# Patient Record
Sex: Female | Born: 1940 | Race: Black or African American | Hispanic: No | Marital: Married | Smoking: Former smoker
Health system: Southern US, Community
[De-identification: ages and names within clinical notes are randomized; demographics above are authoritative.]

## PROBLEM LIST (undated history)

## (undated) DIAGNOSIS — I519 Heart disease, unspecified: Secondary | ICD-10-CM

## (undated) DIAGNOSIS — I10 Essential (primary) hypertension: Secondary | ICD-10-CM

## (undated) DIAGNOSIS — K829 Disease of gallbladder, unspecified: Secondary | ICD-10-CM

## (undated) DIAGNOSIS — F0390 Unspecified dementia without behavioral disturbance: Secondary | ICD-10-CM

## (undated) DIAGNOSIS — E7800 Pure hypercholesterolemia, unspecified: Secondary | ICD-10-CM

## (undated) HISTORY — DX: Pure hypercholesterolemia, unspecified: E78.00

## (undated) HISTORY — DX: Heart disease, unspecified: I51.9

## (undated) HISTORY — DX: Essential (primary) hypertension: I10

## (undated) HISTORY — DX: Disease of gallbladder, unspecified: K82.9

## (undated) HISTORY — PX: ABDOMINAL HYSTERECTOMY: SHX81

---

## 1997-11-25 ENCOUNTER — Other Ambulatory Visit: Admission: RE | Admit: 1997-11-25 | Discharge: 1997-11-25 | Payer: Self-pay | Admitting: Family Medicine

## 1998-12-29 ENCOUNTER — Other Ambulatory Visit: Admission: RE | Admit: 1998-12-29 | Discharge: 1998-12-29 | Payer: Self-pay | Admitting: Family Medicine

## 1998-12-31 ENCOUNTER — Ambulatory Visit (HOSPITAL_COMMUNITY): Admission: RE | Admit: 1998-12-31 | Discharge: 1998-12-31 | Payer: Self-pay | Admitting: Family Medicine

## 1999-01-20 ENCOUNTER — Ambulatory Visit (HOSPITAL_COMMUNITY): Admission: RE | Admit: 1999-01-20 | Discharge: 1999-01-20 | Payer: Self-pay | Admitting: Internal Medicine

## 1999-12-28 ENCOUNTER — Encounter: Payer: Self-pay | Admitting: Family Medicine

## 1999-12-28 ENCOUNTER — Encounter: Admission: RE | Admit: 1999-12-28 | Discharge: 1999-12-28 | Payer: Self-pay | Admitting: Internal Medicine

## 1999-12-30 ENCOUNTER — Other Ambulatory Visit: Admission: RE | Admit: 1999-12-30 | Discharge: 1999-12-30 | Payer: Self-pay | Admitting: Family Medicine

## 2001-02-06 ENCOUNTER — Other Ambulatory Visit: Admission: RE | Admit: 2001-02-06 | Discharge: 2001-02-06 | Payer: Self-pay | Admitting: Family Medicine

## 2001-02-07 ENCOUNTER — Encounter: Admission: RE | Admit: 2001-02-07 | Discharge: 2001-02-07 | Payer: Self-pay | Admitting: Family Medicine

## 2001-02-07 ENCOUNTER — Encounter: Payer: Self-pay | Admitting: Family Medicine

## 2002-03-19 ENCOUNTER — Encounter: Payer: Self-pay | Admitting: Family Medicine

## 2002-03-19 ENCOUNTER — Encounter: Admission: RE | Admit: 2002-03-19 | Discharge: 2002-03-19 | Payer: Self-pay | Admitting: Family Medicine

## 2003-04-02 ENCOUNTER — Encounter: Payer: Self-pay | Admitting: Family Medicine

## 2003-04-02 ENCOUNTER — Encounter: Admission: RE | Admit: 2003-04-02 | Discharge: 2003-04-02 | Payer: Self-pay | Admitting: Family Medicine

## 2004-04-05 ENCOUNTER — Encounter: Admission: RE | Admit: 2004-04-05 | Discharge: 2004-04-05 | Payer: Self-pay | Admitting: Family Medicine

## 2004-11-11 ENCOUNTER — Other Ambulatory Visit: Admission: RE | Admit: 2004-11-11 | Discharge: 2004-11-11 | Payer: Self-pay | Admitting: Family Medicine

## 2004-11-19 ENCOUNTER — Encounter: Admission: RE | Admit: 2004-11-19 | Discharge: 2004-11-19 | Payer: Self-pay | Admitting: Family Medicine

## 2004-12-01 ENCOUNTER — Encounter: Admission: RE | Admit: 2004-12-01 | Discharge: 2004-12-01 | Payer: Self-pay | Admitting: Family Medicine

## 2005-12-31 ENCOUNTER — Encounter: Admission: RE | Admit: 2005-12-31 | Discharge: 2005-12-31 | Payer: Self-pay | Admitting: Family Medicine

## 2006-01-06 ENCOUNTER — Encounter: Admission: RE | Admit: 2006-01-06 | Discharge: 2006-01-06 | Payer: Self-pay | Admitting: Family Medicine

## 2006-08-31 ENCOUNTER — Ambulatory Visit (HOSPITAL_BASED_OUTPATIENT_CLINIC_OR_DEPARTMENT_OTHER): Admission: RE | Admit: 2006-08-31 | Discharge: 2006-08-31 | Payer: Self-pay | Admitting: Orthopedic Surgery

## 2007-01-10 ENCOUNTER — Encounter: Admission: RE | Admit: 2007-01-10 | Discharge: 2007-01-10 | Payer: Self-pay | Admitting: Family Medicine

## 2007-02-08 ENCOUNTER — Ambulatory Visit (HOSPITAL_BASED_OUTPATIENT_CLINIC_OR_DEPARTMENT_OTHER): Admission: RE | Admit: 2007-02-08 | Discharge: 2007-02-08 | Payer: Self-pay | Admitting: Orthopedic Surgery

## 2007-05-21 ENCOUNTER — Encounter: Admission: RE | Admit: 2007-05-21 | Discharge: 2007-05-21 | Payer: Self-pay | Admitting: Family Medicine

## 2007-07-12 DIAGNOSIS — K829 Disease of gallbladder, unspecified: Secondary | ICD-10-CM

## 2007-07-12 HISTORY — DX: Disease of gallbladder, unspecified: K82.9

## 2007-12-13 ENCOUNTER — Emergency Department (HOSPITAL_COMMUNITY): Admission: EM | Admit: 2007-12-13 | Discharge: 2007-12-13 | Payer: Self-pay | Admitting: Emergency Medicine

## 2007-12-19 ENCOUNTER — Emergency Department (HOSPITAL_COMMUNITY): Admission: EM | Admit: 2007-12-19 | Discharge: 2007-12-20 | Payer: Self-pay | Admitting: Emergency Medicine

## 2008-01-15 ENCOUNTER — Encounter: Admission: RE | Admit: 2008-01-15 | Discharge: 2008-01-15 | Payer: Self-pay | Admitting: Family Medicine

## 2008-01-17 ENCOUNTER — Encounter: Admission: RE | Admit: 2008-01-17 | Discharge: 2008-01-17 | Payer: Self-pay | Admitting: General Surgery

## 2008-01-24 ENCOUNTER — Ambulatory Visit (HOSPITAL_COMMUNITY): Admission: RE | Admit: 2008-01-24 | Discharge: 2008-01-24 | Payer: Self-pay | Admitting: General Surgery

## 2008-02-06 ENCOUNTER — Ambulatory Visit (HOSPITAL_COMMUNITY): Admission: RE | Admit: 2008-02-06 | Discharge: 2008-02-06 | Payer: Self-pay | Admitting: General Surgery

## 2008-02-06 ENCOUNTER — Encounter (INDEPENDENT_AMBULATORY_CARE_PROVIDER_SITE_OTHER): Payer: Self-pay | Admitting: General Surgery

## 2008-07-11 HISTORY — PX: REPLACEMENT TOTAL KNEE: SUR1224

## 2008-12-17 ENCOUNTER — Encounter: Admission: RE | Admit: 2008-12-17 | Discharge: 2008-12-17 | Payer: Self-pay | Admitting: Internal Medicine

## 2009-06-13 IMAGING — CT CT PELVIS W/ CM
1 of 3 series · 13 of 32 positions shown, 18 images · IV contrast (agent unspecified)
Comparison: 12/31/2005, 11/19/2004.]

CT ABDOMEN

Addendum Begins

The following was inadvertently omitted from the abdomen impression
section:
4.  Chronic cholelithiasis.
Addendum Ends
CLINICAL DATA: 67-year-old female with right flank pain nausea and
vomiting.
CT ABDOMEN AND PELVIS WITH CONTRAST
TECHNIQUE: Multidetector CT imaging of the abdomen and pelvis was
performed using the standard protocol following bolus
administration of intravenous contrast.
Contrast: 100 ml Umnipaque-RBB.

[Series 2: abd_pel 5.0 b40f st · axial · 0.78mm/px · z∈[-393,-33]mm · 13 of 82 slices shown, 18 images]
[im 5/82  soft-tissue]
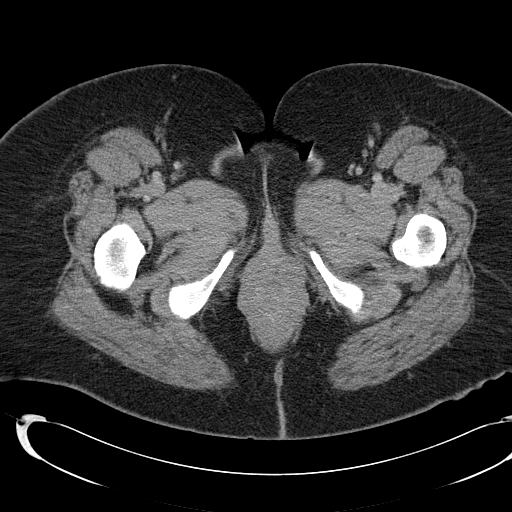
[im 5/82  bone]
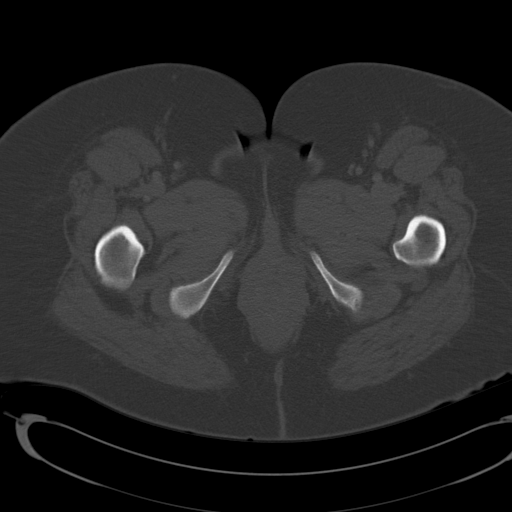
[im 14/82  soft-tissue]
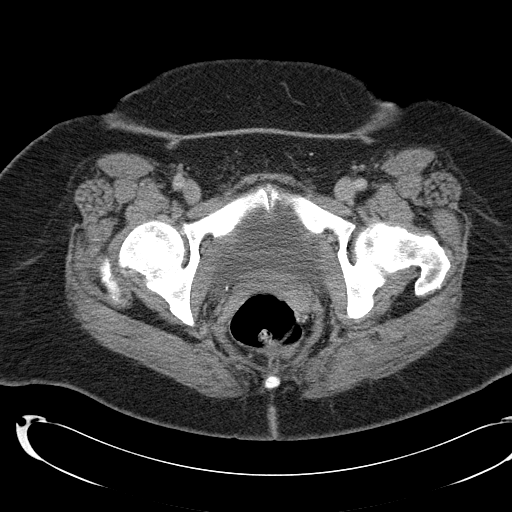
[im 19/82  soft-tissue]
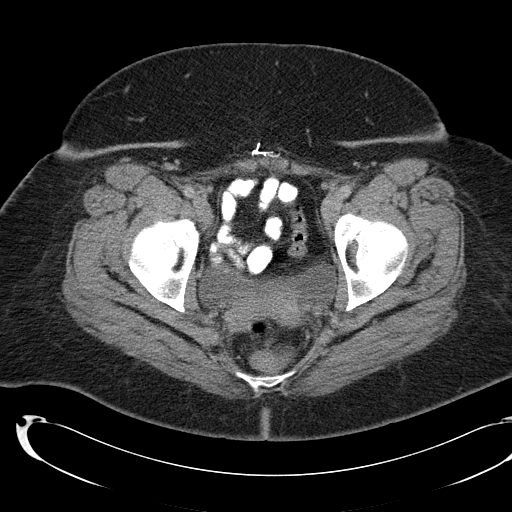
[im 23/82  soft-tissue]
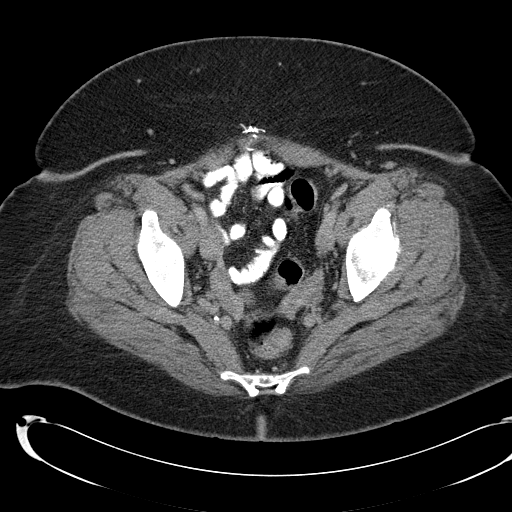
[im 32/82  soft-tissue]
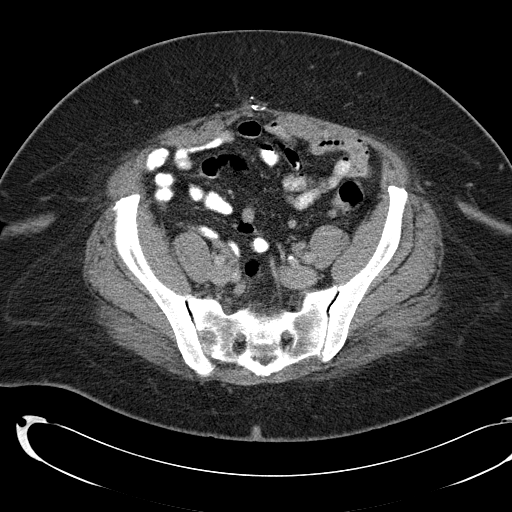
[im 37/82  soft-tissue]
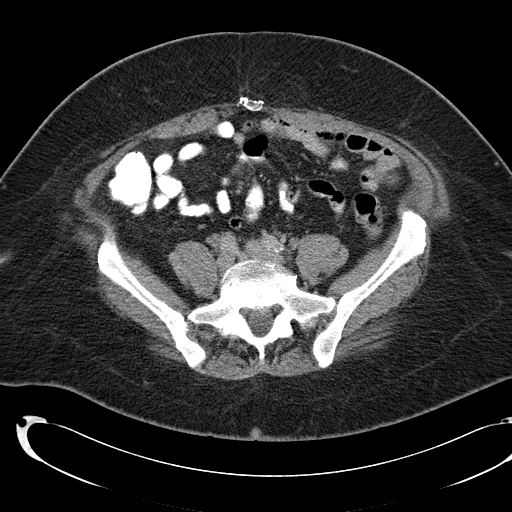
[im 46/82  soft-tissue]
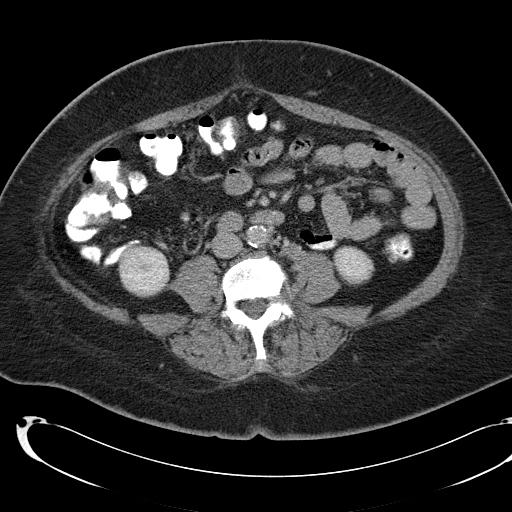
[im 50/82  soft-tissue]
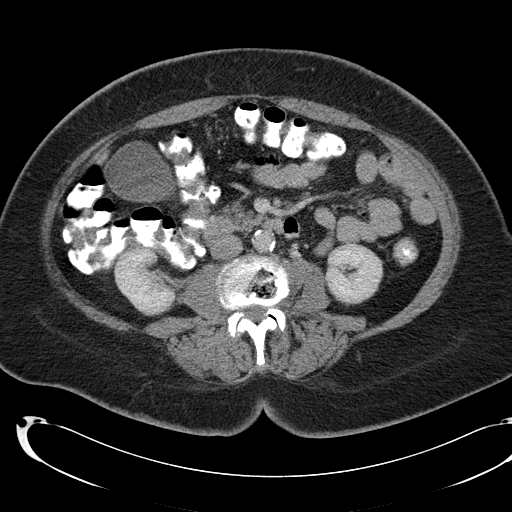
[im 59/82  soft-tissue]
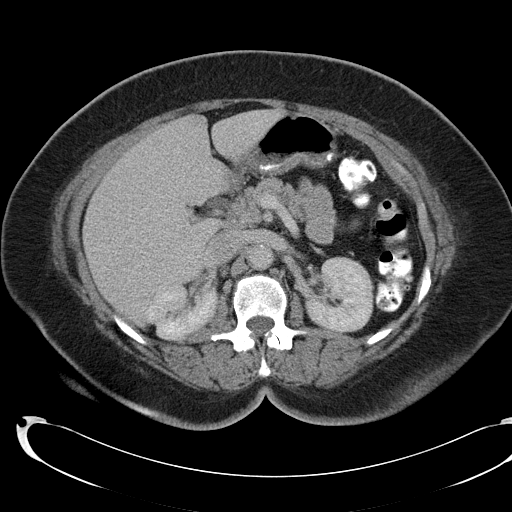
[im 59/82  bone]
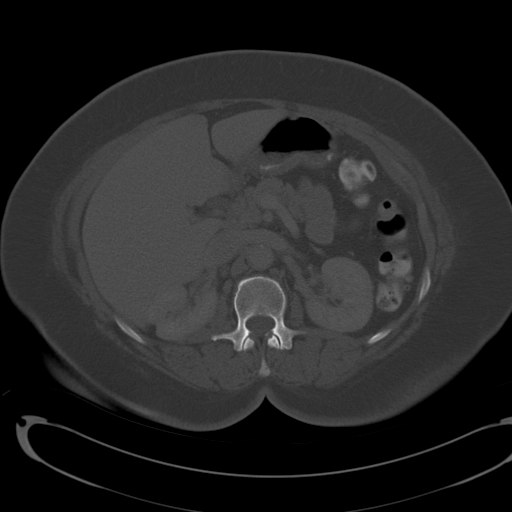
[im 64/82  soft-tissue]
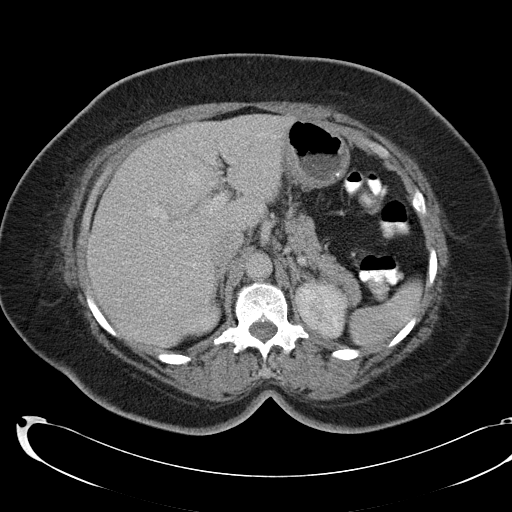
[im 64/82  lung]
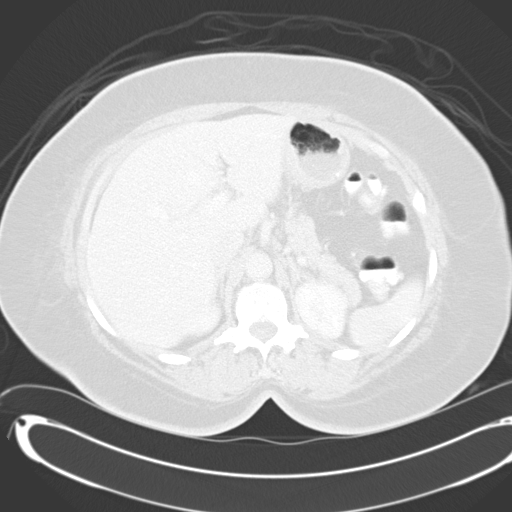
[im 68/82  soft-tissue]
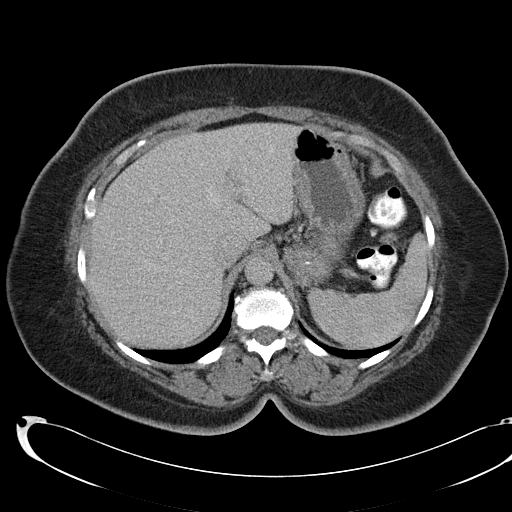
[im 68/82  lung]
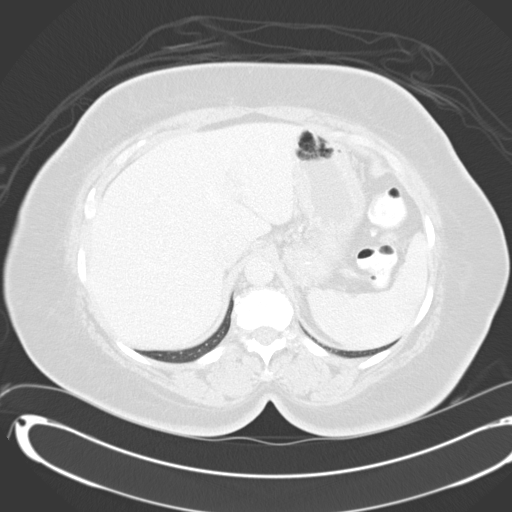
[im 73/82  lung]
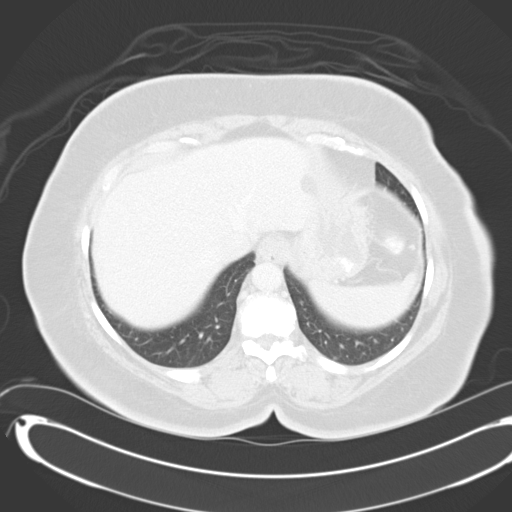
[im 77/82  soft-tissue]
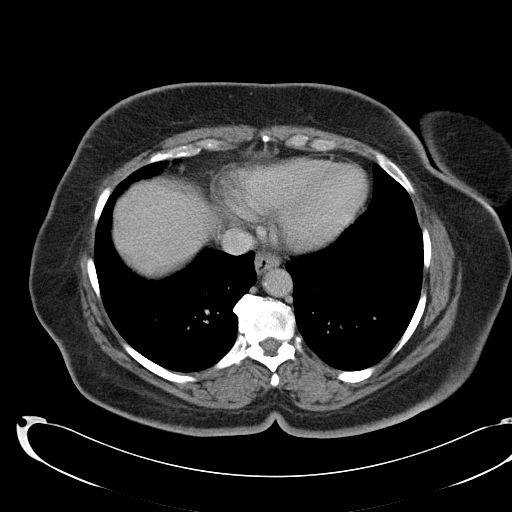
[im 77/82  lung]
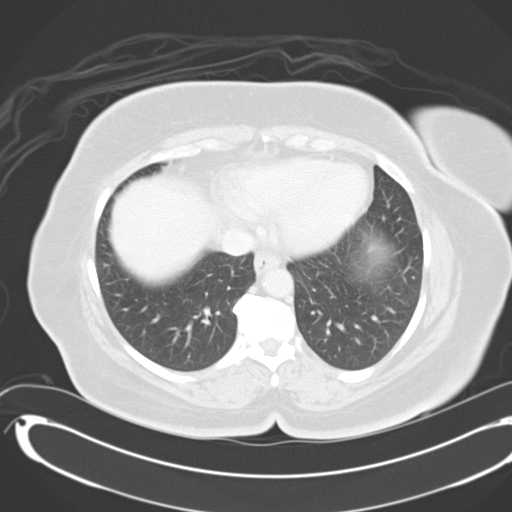

[13 of 32 positions shown; findings below may reference images not displayed]

FINDINGS: Visualized lung bases are clear.  Multilevel
degenerative changes in the lower thoracic and lower lumbar spine.
No acute osseous abnormality.  To circumscribed low density lesions
in the left hepatic lobe are unchanged since 2444.  The liver is
otherwise normal.  Chronic cholelithiasis.  The gallbladder is
distended to the upper limits of normal.  No pericholecystic fluid
or inflammation evident.  The spleen, pancreas, adrenal glands,
stomach, duodenum and proximal small bowel loops are normal.  The
left kidney is normal.  The right kidney is stable with persistent
fetal lobulation versus multifocal areas of cortical scarring.
There is no evidence of right renal obstruction.  There are is
evidence of a 2 mm nonobstructing calculus in the mid pole.  The
proximal ureters are decompressed.  Portal venous system is patent.
Postoperative changes to the ventral abdomen around the area of the
umbilicus are stable.  Oral contrast has reached the splenic
flexure without evidence of obstruction.  There are occasional
colonic diverticula.  The colon is mildly redundant.  Terminal
ileum is normal.  Appendix is not identified and probably
surgically absent.
IMPRESSION: 1.  No acute inflammatory findings in the abdomen.
2.  Stable lobulated right kidney with possible nonobstructing
nephrolithiasis.
3.  Hepatic cysts.

CT PELVIS
FINDINGS: Bladder is within normal limits.  Visualized distal
small bowel is normal.  Visualized distal colon within normal
limits aside from occasional diverticula, no active inflammation
identified.  Atherosclerosis of the abdominal aorta and iliac
vessels is noted.  No free fluid.  Is surgically absent.  Bilateral
adnexa within normal limits.  No acute osseous abnormality.
IMPRESSION: No acute findings in the pelvis.

## 2009-07-11 HISTORY — PX: CARDIAC SURGERY: SHX584

## 2010-01-20 ENCOUNTER — Encounter: Admission: RE | Admit: 2010-01-20 | Discharge: 2010-01-20 | Payer: Self-pay | Admitting: Internal Medicine

## 2010-01-26 ENCOUNTER — Encounter: Admission: RE | Admit: 2010-01-26 | Discharge: 2010-01-26 | Payer: Self-pay | Admitting: Internal Medicine

## 2010-06-11 ENCOUNTER — Encounter: Admission: RE | Admit: 2010-06-11 | Discharge: 2010-06-11 | Payer: Self-pay | Admitting: Internal Medicine

## 2010-08-01 ENCOUNTER — Encounter: Payer: Self-pay | Admitting: Internal Medicine

## 2010-08-01 ENCOUNTER — Encounter: Payer: Self-pay | Admitting: Family Medicine

## 2010-08-02 ENCOUNTER — Encounter: Payer: Self-pay | Admitting: Internal Medicine

## 2010-11-23 NOTE — Op Note (Signed)
NAME:  Laurie Mays, Laurie Mays              ACCOUNT NO.:  [id number]   MEDICAL RECORD NO.:  [id number]          PATIENT TYPE:  OIB   LOCATION:  2550                         FACILITY:  [facility]   PHYSICIAN:  Ollen Gross. Vernell Morgans, M.D. DATE OF BIRTH:  [birth year]   DATE OF PROCEDURE:  02/06/2008  DATE OF DISCHARGE:  02/06/2008                               OPERATIVE REPORT   PREOPERATIVE DIAGNOSIS:  Gallstones.   POSTOPERATIVE DIAGNOSIS:  Gallstones.   PROCEDURE:  Laparoscopic cholecystectomy with intraoperative  cholangiogram.   SURGEON:  Ollen Gross. Vernell Morgans, MD   ASSISTANT:  Anselm Pancoast. Zachery Dakins, MD   ANESTHESIA:  General endotracheal.   PROCEDURE:  After informed consent was obtained, the patient was brought  to the operating room, placed in the supine position on the operating  room table.  After adequate induction of general anesthesia, the  patient's abdomen was prepped with Betadine and draped in the usual  sterile manner.  The area above the umbilicus was infiltrated with 0.25%  Marcaine.  A small incision was made with a 15-blade knife.  This  incision was carried down through the subcutaneous tissue bluntly with  the hemostat and [employer] retractors until the linea alba was  identified.  The linea alba was incised with a 15-blade knife and each  side was grasped with Kocher clamps and elevated anteriorly.  The  preperitoneal space was then probed bluntly with the hemostat until the  peritoneum was opened and access was gained to the abdominal cavity.  A  0 Vicryl pursestring stitch was placed in the fascia around the opening.  A Hasson cannula was placed through the opening and anchored in place at  the previous with 0 Vicryl pursestring stitch.  The abdomen was then  insufflated with carbon dioxide without difficulty.  The patient was  placed in reverse Trendelenburg position and rotated with the right side  up.  The laparoscope was inserted through the Hasson cannula and the  right  upper quadrant was inspected.  The dome of the gallbladder and  liver were readily identified.  There were some fatty adhesions to the  body of the gallbladder and liver, and these were all taken down sharply  with laparoscopic scissors.   Next, sites were chosen laterally on the right side with placement of 5-  mm ports.  Each of these areas were infiltrated with 0.25% Marcaine.  Small stab incisions were made with a 15-blade knife and 5-mm ports were  placed bluntly through these incisions into the abdominal cavity under  direct vision.  A blunt grasper was placed through the lateral-most 5-mm  port and used to grasp the dome of the gallbladder and elevated  anteriorly and superiorly.  Another blunt grasper was placed through the  other 5-mm port and used to retract on the body and neck of the  gallbladder.  The dissector was placed through the epigastric port and  using the electrocautery, the peritoneal reflection of the gallbladder  neck was opened.  Blunt dissection was carried out in this area until  the gallbladder neck cystic duct junction was readily identified  and a  good window was created.  A single clip was placed on the gallbladder  neck.  A small ductotomy was made just below the clip.  A 14-gauge  Angiocath was placed percutaneously through the anterior abdominal wall  under direct vision.  A Reddick cholangiogram catheter was placed  through the Angiocath and flushed.  The Reddick catheter was then placed  within the cystic duct and anchored in place with a clip.  Cholangiogram  was obtained that showed no filling defects, good emptying in the  duodenum, and adequate length on the cystic duct.  The anchoring clip  and catheter was then removed from the patient.  Three clips were placed  proximally on the cystic duct and duct was divided between the 2 sets of  clips.  Posterior to this, the cystic artery was identified and again  dissected bluntly in a circumferential  manner until a good window was  created.  Two clips were placed proximally and one distally on the  artery and the artery was divided between the two.  Next, a laparoscopic  hook cautery device was used to separate the gallbladder from the liver  bed prior to completely detaching the gallbladder from the liver bed.  The liver bed was inspected and several small bleeding points were  coagulated with the electrocautery until the area was completely  hemostatic.  A posterior branch of the cystic artery was also identified  and was controlled with vascular clips.  The gallbladder was then  detached from its site from the liver bed without difficulty.  A  laparoscopic bag was inserted through the epigastric port.  The  gallbladder was placed in the bag and the bag was sealed.  The abdomen  was then irrigated with copious amounts of saline until the effluent was  clear.  The laparoscope was then moved to the epigastric port.  A  gallbladder grasper was placed through the Hasson cannula and used to  grasp to open the bag.  The bag with the gallbladder was removed through  the supraumbilical port without difficulty.  The fascial defect was  closed to the previously stitched Vicryl purse-string stitch as well as  another figure-of-eight 0 Vicryl stitch.  The rest of the ports were  removed under direct vision and were found to be hemostatic.  The gas  was allowed to escape.  The skin incisions were all closed with  interrupted 4-0 Monocryl subcuticular stitches.  Dermabond dressings  were applied.  The patient tolerated the procedure well.  At the end of  the case, all needle, sponge, and instrument counts were correct.  The  patient was then awakened and taken to the recovery room in stable  condition.      Ollen Gross. Vernell Morgans, M.D.  Electronically Signed     PST/MEDQ  D:  02/06/2008  T:  02/07/2008  Job:  [id number]

## 2010-11-23 NOTE — Op Note (Signed)
NAME:  Laurie Mays, Laurie Mays              ACCOUNT NO.:  [id number]   MEDICAL RECORD NO.:  [id number]          PATIENT TYPE:  AMB   LOCATION:  DSC                          FACILITY:  [facility]   PHYSICIAN:  Cindee Salt, M.D.       DATE OF BIRTH:  1941-06-11   DATE OF PROCEDURE:  02/08/2007  DATE OF DISCHARGE:                               OPERATIVE REPORT   PREOPERATIVE DIAGNOSIS:  Carpal tunnel syndrome right hand.   POSTOPERATIVE DIAGNOSIS:  Carpal tunnel syndrome right hand.   OPERATION:  Release right carpal canal.   SURGEON:  Cindee Salt, M.D.   ASSISTANT:  Carolyne Fiscal R.N.   ANESTHESIA:  Forearm based IV regional with supplementation.   ANESTHESIOLOGIST:  Janetta Hora. Gelene Mink, M.D.   HISTORY:  The patient is a 70 year old female with a history of carpal  tunnel syndrome, EMG nerve conductions positive which has not responded  to conservative treatment.   PROCEDURE:  The patient is brought to the operating room after a  discussion of the surgery with the patient.  She is aware of risks and  complications including infection, recurrence, injury to arteries,  nerves, tendons, incomplete relief of symptoms, dystrophy.  The  extremity marked by both the patient and surgeon.  Antibiotic given.  A  forearm based IV regional anesthetic was carried out without difficulty.  She was prepped using DuraPrep, supine position, right arm free.  A  longitudinal incision was made in the palm and carried down through  subcutaneous tissue.  Bleeders were electrocauterized.  Palmar fascia  was split, superficial palmar arch identified, flexor tendon of the ring  and little finger identified to the ulnar side of median nerve.  Carpal  retinaculum was incised with sharp dissection, right angle and Sewall  retractor placed between skin and forearm fascia.  The fascia released  for approximately 1.5 cm proximal to the wrist crease under direct  vision.  The canal was explored.  Area of compression was apparent.   No  further lesions were identified.  The wound was irrigated.  Skin was  then closed with interrupted 5-0 Vicryl Rapide sutures.  A sterile  compressive dressing and splint with the fingers free applied.  The  patient tolerated the procedure well and was taken to the recovery room  for observation in satisfactory condition.  She is discharged home to  return to the [facility] [place] in one week on Vicodin.           ______________________________  Cindee Salt, M.D.    GK/MEDQ  D:  02/08/2007  T:  02/08/2007  Job:  [id number]   cc:   Lianne Bushy, M.D.

## 2010-11-26 NOTE — Op Note (Signed)
NAME:  ETTEL, ALBERGO              ACCOUNT NO.:  [id number]   MEDICAL RECORD NO.:  [id number]          PATIENT TYPE:  AMB   LOCATION:  DSC                          FACILITY:  [facility]   PHYSICIAN:  Cindee Salt, M.D.       DATE OF BIRTH:  1940/07/22   DATE OF PROCEDURE:  08/31/2006  DATE OF DISCHARGE:                               OPERATIVE REPORT   PREOPERATIVE DIAGNOSIS:  Carpal tunnel syndrome, left hand.   POSTOPERATIVE DIAGNOSIS:  Carpal tunnel syndrome, left hand.   OPERATION:  Release of carpal canal, left hand.   SURGEON:  Cindee Salt, M.D.   ASSISTANT:  Carolyne Fiscal, R.N.   ANESTHESIA:  Forearm based IV regional.   HISTORY:  The patient is a 70 year old female with a history of carpal  tunnel syndrome.  EMG nerve conductions positive.  Has not responded to  conservative treatment.  She is desirous of proceeding with a release.  She is aware of risks and complications including infection, recurrence,  injury to arteries, nerves, tendons, complete relief of symptoms,  dystrophy.  In the preoperative area questions are encouraged and  answered.  The extremity marked by both the patient and surgeon.   PROCEDURE:  The patient is brought to the operating room where forearm  based IV regional anesthetic was carried out without difficulty.  She  was prepped using DuraPrep, supine position, left arm free.  A  longitudinal incision was made in the palm, carried down through  subcutaneous tissue.  Bleeders were electrocauterized.  Palmar fascia  was split, superficial palmar arch identified, flexor tendon in the ring  and little finger identified to the ulnar side of median nerve.  Carpal  retinaculum was incised with sharp dissection.  A right-angle and Sewall  retractor were placed between skin and forearm fascia.  The fascia was  released for approximately 1.5 cm proximal to the wrist crease under  direct vision.  No further lesions were identified.  Tenosynovium tissue  was moderately  thickened.  Area of compression in the nerve was  apparent.  The wound was irrigated.  The skin was then closed with  interrupted 5-0 nylon sutures.  Sterile compressive dressing and splint  was applied.  The patient tolerated the procedure well, was taken to the  recovery room for observation in satisfactory condition.           ______________________________  Cindee Salt, M.D.     GK/MEDQ  D:  08/31/2006  T:  08/31/2006  Job:  [id number]   cc:   Lianne Bushy, M.D.

## 2011-01-10 ENCOUNTER — Other Ambulatory Visit: Payer: Self-pay | Admitting: Internal Medicine

## 2011-01-10 DIAGNOSIS — Z1231 Encounter for screening mammogram for malignant neoplasm of breast: Secondary | ICD-10-CM

## 2011-02-01 ENCOUNTER — Ambulatory Visit
Admission: RE | Admit: 2011-02-01 | Discharge: 2011-02-01 | Disposition: A | Discharge status: Home / Self Care | Payer: Medicare Other | Source: Ambulatory Visit | Attending: Internal Medicine | Admitting: Internal Medicine

## 2011-02-01 DIAGNOSIS — Z1231 Encounter for screening mammogram for malignant neoplasm of breast: Secondary | ICD-10-CM

## 2011-02-02 ENCOUNTER — Ambulatory Visit: Payer: Self-pay

## 2011-02-15 ENCOUNTER — Other Ambulatory Visit: Payer: Self-pay | Admitting: Orthopedic Surgery

## 2011-02-15 ENCOUNTER — Encounter (HOSPITAL_COMMUNITY): Payer: Medicare Other

## 2011-02-15 LAB — PROTIME-INR: INR: 1 (ref 0.00–1.49)

## 2011-02-15 LAB — CBC
HCT: 42.8 % (ref 36.0–46.0)
Hemoglobin: 13.8 g/dL (ref 12.0–15.0)
MCH: 26.6 pg (ref 26.0–34.0)
MCHC: 32.2 g/dL (ref 30.0–36.0)

## 2011-02-15 LAB — SURGICAL PCR SCREEN: MRSA, PCR: NEGATIVE

## 2011-02-15 LAB — URINALYSIS, ROUTINE W REFLEX MICROSCOPIC
Bilirubin Urine: NEGATIVE
Glucose, UA: NEGATIVE mg/dL
Ketones, ur: NEGATIVE mg/dL
Leukocytes, UA: NEGATIVE
pH: 6 (ref 5.0–8.0)

## 2011-02-15 LAB — COMPREHENSIVE METABOLIC PANEL
ALT: 15 U/L (ref 0–35)
Albumin: 3.6 g/dL (ref 3.5–5.2)
Alkaline Phosphatase: 74 U/L (ref 39–117)
BUN: 19 mg/dL (ref 6–23)
Chloride: 100 mEq/L (ref 96–112)
Potassium: 3.3 mEq/L — ABNORMAL LOW (ref 3.5–5.1)
Sodium: 138 mEq/L (ref 135–145)
Total Bilirubin: 0.3 mg/dL (ref 0.3–1.2)

## 2011-02-28 ENCOUNTER — Inpatient Hospital Stay (HOSPITAL_COMMUNITY)
Admission: RE | Admit: 2011-02-28 | Discharge: 2011-03-04 | DRG: 470 | Disposition: A | Discharge status: Skilled Nursing Facility | Payer: Medicare Other | Source: Ambulatory Visit | Attending: Orthopedic Surgery | Admitting: Orthopedic Surgery

## 2011-02-28 DIAGNOSIS — M25469 Effusion, unspecified knee: Secondary | ICD-10-CM | POA: Diagnosis not present

## 2011-02-28 DIAGNOSIS — I8390 Asymptomatic varicose veins of unspecified lower extremity: Secondary | ICD-10-CM | POA: Diagnosis present

## 2011-02-28 DIAGNOSIS — M1710 Unilateral primary osteoarthritis, unspecified knee: Principal | ICD-10-CM | POA: Diagnosis present

## 2011-02-28 DIAGNOSIS — I10 Essential (primary) hypertension: Secondary | ICD-10-CM | POA: Diagnosis present

## 2011-02-28 DIAGNOSIS — E669 Obesity, unspecified: Secondary | ICD-10-CM | POA: Diagnosis present

## 2011-02-28 DIAGNOSIS — E871 Hypo-osmolality and hyponatremia: Secondary | ICD-10-CM | POA: Diagnosis not present

## 2011-02-28 DIAGNOSIS — Z01812 Encounter for preprocedural laboratory examination: Secondary | ICD-10-CM

## 2011-02-28 DIAGNOSIS — E7800 Pure hypercholesterolemia, unspecified: Secondary | ICD-10-CM | POA: Diagnosis present

## 2011-02-28 LAB — ABO/RH: ABO/RH(D): A POS

## 2011-02-28 LAB — PROTIME-INR
INR: 1.03 (ref 0.00–1.49)
Prothrombin Time: 13.7 seconds (ref 11.6–15.2)

## 2011-02-28 LAB — TYPE AND SCREEN

## 2011-03-01 LAB — CBC
HCT: 35.2 % — ABNORMAL LOW (ref 36.0–46.0)
MCHC: 31.8 g/dL (ref 30.0–36.0)
Platelets: 192 10*3/uL (ref 150–400)
RDW: 15 % (ref 11.5–15.5)
WBC: 6.4 10*3/uL (ref 4.0–10.5)

## 2011-03-01 LAB — BASIC METABOLIC PANEL
Chloride: 100 mEq/L (ref 96–112)
GFR calc Af Amer: >60 mL/min (ref >60)
GFR calc non Af Amer: >60 mL/min (ref >60)
Potassium: 3.5 mEq/L (ref 3.5–5.1)
Sodium: 134 mEq/L — ABNORMAL LOW (ref 135–145)

## 2011-03-02 LAB — BASIC METABOLIC PANEL
BUN: 6 mg/dL (ref 6–23)
Chloride: 98 mEq/L (ref 96–112)
GFR calc non Af Amer: >60 mL/min (ref >60)
Glucose, Bld: 136 mg/dL — ABNORMAL HIGH (ref 70–99)
Potassium: 3.7 mEq/L (ref 3.5–5.1)
Sodium: 135 mEq/L (ref 135–145)

## 2011-03-02 LAB — CBC
HCT: 36.3 % (ref 36.0–46.0)
Hemoglobin: 11.8 g/dL — ABNORMAL LOW (ref 12.0–15.0)
RBC: 4.38 MIL/uL (ref 3.87–5.11)
WBC: 10.2 10*3/uL (ref 4.0–10.5)

## 2011-03-03 LAB — CBC
HCT: 34.1 % — ABNORMAL LOW (ref 36.0–46.0)
Hemoglobin: 11.6 g/dL — ABNORMAL LOW (ref 12.0–15.0)
MCH: 27.5 pg (ref 26.0–34.0)
MCHC: 34 g/dL (ref 30.0–36.0)
RBC: 4.22 MIL/uL (ref 3.87–5.11)

## 2011-03-10 NOTE — Discharge Summary (Signed)
Laurie Mays, Laurie Mays              ACCOUNT NO.:  [id number]  MEDICAL RECORD NO.:  [id number]  LOCATION:  1618                         FACILITY:  [facility]  PHYSICIAN:  Ollen Gross, M.D.    DATE OF BIRTH:  [birth year]  DATE OF ADMISSION:  02/28/2011 DATE OF DISCHARGE:  03/04/2011                        DISCHARGE SUMMARY - REFERRING   ADMITTING DIAGNOSES: 1. Osteoarthritis, right knee. 2. Hypertension. 3. Hypercholesterolemia. 4. Varicose veins.  DISCHARGE DIAGNOSES: 1. Osteoarthritis right knee status post right total knee replacement     arthroplasty. 2. Left knee pain with effusion status post aspiration, cortisone     injection. 3. Postop hyponatremia. 4. Osteoarthritis, right knee. 5. Hypertension. 6. Hypercholesterolemia. 7. Varicose veins.  PROCEDURE:  February 28, 2011, right total knee.  Surgeon, Dr. Lequita Halt. Assistant, Alexzandrew L. Perkins, P.A.C.  Anesthesia was attempted spinal, then conversion to general.  Tourniquet time 44 minutes.  BEDSIDE PROCEDURE:  March 03, 2011, left knee aspiration utilizing sterile technique.  The patient underwent anesthetization of 3 cc of 1% Xylocaine into the superolateral portal site of the left knee followed by aspiration of about 70 cc of joint fluid followed by a cortisone injection of 6 cc of lidocaine and 80 mg of Depo-Medrol.  ASPIRATION:  Bedside aspiration done on March 03, 2011, by Alexzandrew L. Julien Girt, PA-C.  CONSULTS:  None.  BRIEF HISTORY:  The patient is a 70 year old female with advanced arthritis of the right knee, progressive worsening pain and dysfunction. She has bone-on-bone arthritis in medial and patellofemoral compartments.  Nonoperative management including injections which have failed, analgesics have failed, had significant dysfunction, no contraindications to surgery, now presents for total knee arthroplasty.  LABORATORY DATA:  Preop CBC showed a hemoglobin of 13.8, hematocrit 42.8, white cell  count 5.6, platelets 229.  PT/INR of 13.41 with a PTT of 32.  Chem panel on admission slightly low potassium at 3.3. Remaining Chem panel all within normal limits.  Preop UA negative. Blood group type A positive.  Nasal swabs were negative.  Staph aureus negative for MRSA.  Serial CBCs were followed for 3 days.  Hemoglobin dropped down to 11.2, back up to 11.8.  Last hemoglobin and hematocrit 11.6 and 34.1.  BMETs were followed for 48 hours.  Sodium dropped from 138-134, back up to 135.  Remaining electrolytes remained within normal limits.  HOSPITAL COURSE:  The patient was admitted to [facility], taken to OR, underwent above-stated procedure without any complication. The patient tolerated procedure well, later transferred to the recovery room and orthopedic floor, started on p.o. and IV analgesic for pain control following surgery, doing pretty well on the morning of day 1. Sodium was down a little bit, felt to be a dilutional component, but she was diuresing very well.  She was started back on her home medication, starting to get up out of bed on day 1.  By day 2, however, she started having a little bit discomfort in the other leg and around the left knee.  The dressing change on the right knee looked good and it was changed, and the incision was healing well.  Hemoglobin was stable.  She started getting up and was a little slow due to  the left knee pain.  She was seen on morning of day 3 and the left knee had swelled up and had a large mass with effusion, she probably had a flare up of the underlying arthritis.  That morning, she underwent an aspiration injection at the bedside, which did help decompressing the knee.  She started doing a little bit better with physical therapy and was concerned that whether she would need home versus skilled, she wanted to look into skilled facility.  She is walking about 30 feet.  She was seen on morning day 4. We had got Social Work  involved and she wanted look into a skilled facility.  She was seen in rounds by Dr. Lequita Halt on August 24, doing well and it was decided the patient be transferred over to skilled facility of choice.  DISCHARGE/PLAN: 1. The patient will be transferred to skilled nursing facility of     choice, but pending at this time. 2. Discharge diagnoses, please see above. 3. Discharge meds.  CURRENT MEDICATIONS AT TIME OF TRANSFER: 1. Xarelto 10 mg p.o. daily for 3 weeks, then discontinue the Xarelto. 2. Colace 100 mg p.o. b.i.d. 3. Maxzide 75/50 p.o. every other day. 4. Potassium chloride 10 mEq p.o. daily. 5. Robaxin 500 mg p.o. q.6-8 h. p.r.n. spasm. 6. Restoril 15-30 mg p.o. q.h.s. p.r.n. sleep. 7. Laxative of choice, enema of choice. 8. OxyIR 5 mg 1-2 every 4 hours as needed for moderate pain. 9. Tylenol 325 mg 1 or 2 every 4-6 hours needed for mild pain,     temperature or headache. 10.Gabapentin 100 mg daily as needed.  DIET:  As tolerated, low-cholesterol diet.  ACTIVITIES:  She is weightbearing as tolerated, total knee protocol.  PT and OT for gait training, ambulation, ADLs, range of motion, and strengthening exercises.  FOLLOWUP:  She needs to follow up with Dr. Lequita Halt in approximately 2 weeks from surgery.  Please contact the office at [phone] to help arrange appointment and follow up care of this patient.  DISPOSITION:  Pending at the time of dictation, waiting on bed.  CONDITION ON DISCHARGE:  Improving at the time of dictation.     Alexzandrew L. Julien Girt, P.A.C.   ______________________________ Ollen Gross, M.D.    ALP/MEDQ  D:  03/04/2011  T:  03/04/2011  Job:  [id number]  cc:   Feliciana Rossetti, MD Fax: [phone]  Electronically Signed by Patrica Duel P.A.C. on 03/07/2011 04:08:36 PM Electronically Signed by Ollen Gross M.D. on 03/10/2011 46:96:29 PM

## 2011-03-10 NOTE — H&P (Signed)
NAMEZYAN, MIRKIN NO.:  [id number]  MEDICAL RECORD NO.:  [id number]  LOCATION:  1618                         FACILITY:  [facility]  PHYSICIAN:  Ollen Gross, M.D.    DATE OF BIRTH:  1940-11-15  DATE OF ADMISSION:  02/28/2011 DATE OF DISCHARGE:  03/04/2011                             HISTORY & PHYSICAL   CHIEF COMPLAINT:  Right knee pain.  HISTORY OF PRESENT ILLNESS:  The patient is a 70 year old female who has seen by Dr. Lequita Halt for ongoing bilateral knee pain is a second opinion. Both knees hurt, but the right knee is more problematic at this point. Both knees have been progressively getting worse with time.  She has had cortisone injections also viscous supplementation injections and have failed both with no benefit.  She has advanced end-stage bone-on-bone changes in the knees, slightly worse on the right than the left.  She is at a point where she would benefit undergoing surgical intervention. Risks and benefits has been discussed.  She elects to proceed with surgery.  ALLERGIES:  No known drug allergies.  CURRENT MEDICATIONS:  Office medications listed are potassium and Maxzide.  PAST MEDICAL HISTORY:  Hypertension, hypercholesterolemia, varicose veins, and osteoarthritis.  PAST SURGICAL HISTORY:  Cholecystectomy in 2009 and hysterectomy in the 1970s.  FAMILY HISTORY:  Father deceased at age 43.  Mother with dementia.  SOCIAL HISTORY:  Married.  Retired.  Past smoker.  No alcohol.  Husband will be assisting with care after surgery.  REVIEW OF SYSTEMS:  GENERAL:  No fevers, chills, or night sweats. NEUROLOGIC:  No seizures, syncope, or paralysis. RESPIRATORY:  No shortness of breath, productive cough, or hemoptysis. CARDIOVASCULAR:  Chest pain or orthopnea. GI:  No nausea, vomiting, diarrhea, or constipation. GU:  No dysuria or hematuria. MUSCULOSKELETAL:  Knee pain.  PHYSIAL EXAMINATION:  VITAL SIGNS:  Pulse 72, respirations 18, and  blood pressure 144/78. GENERAL:  70 year old African American female; well nourished, well developed, overweight, alert, oriented, and cooperative. HEENT:  Normocephalic, atraumatic.  Pupils round and reactive.  EOMs intact.  Noted to wear glasses. NECK:  Supple.  No carotid bruits appreciated. CHEST:  Clear anterior posterior chest walls.  No rhonchi, rales, or wheezing. HEART:  Regular rhythm.  No murmur or gallops. ABDOMEN:  Bowel sounds present.  Protuberant abdomen, round and soft. RECTAL, BREASTS, AND GENITALIA:  Not done, not pertinent to present illness. EXTREMITIES:  Right knee varus deformity.  Range of motion 10-115. Marked crepitus.  Slight edema.  Tender more medial than lateral.  IMPRESSION:  Osteoarthritis, right knee.  PLAN:  The patient was admitted to [facility], undergo right total knee replacement arthroplasty.  Surgery will be performed by Ollen Gross.  There is no contraindications to the upcoming surgery such as infection or any progressive rapidly neurological disease she does, but she is open to the idea possibly need a skilled rehab if indicated.     Alexzandrew L. Julien Girt, P.A.C.   ______________________________ Ollen Gross, M.D.    ALP/MEDQ  D:  03/06/2011  T:  03/06/2011  Job:  [id number]  cc:   Feliciana Rossetti, MD Fax: [phone]  Electronically Signed by Patrica Duel P.A.C. on 03/07/2011 04:08:46  PM Electronically Signed by Ollen Gross M.D. on 03/10/2011 04:04:24 PM

## 2011-03-10 NOTE — Op Note (Signed)
NAMEERAN, WINDISH NO.:  [id number]  MEDICAL RECORD NO.:  [id number]  LOCATION:  1618                         FACILITY:  [facility]  PHYSICIAN:  Ollen Gross, M.D.    DATE OF BIRTH:  03-Nov-1940  DATE OF PROCEDURE:  02/28/2011 DATE OF DISCHARGE:                              OPERATIVE REPORT   PREOPERATIVE DIAGNOSIS:  Osteoarthritis, right knee.  POSTOPERATIVE DIAGNOSIS:  Osteoarthritis, right knee.  PROCEDURE:  Right total knee arthroplasty.  SURGEON:  Ollen Gross, MD  ASSISTANT:  Alexzandrew L. Perkins, PA-C  ANESTHESIA:  Attempted spinal, then conversion to general.  ESTIMATED BLOOD LOSS:  Minimal.  DRAIN:  Hemovac x1.  TOURNIQUET TIME:  44 minutes at 300 mmHg.  COMPLICATIONS:  None.  CONDITION:  Stable to Recovery.  BRIEF CLINICAL NOTE:  Ms. Romanello is a 70 year old female with advanced end-stage arthritis of the right knee with progressively worsening pain and dysfunction.  She has bone on bone arthritis in the medial and patellofemoral compartments.  She has had nonoperative management, including injections which has failed.  Analgesics have failed also. She has had significant dysfunction.  There were no contraindications to surgery.  She presents now for right total knee arthroplasty.  PROCEDURE IN DETAIL:  After attempted administration of spinal anesthetic, a tourniquet was placed high on her right thigh, and right lower extremity prepped and draped in usual sterile fashion.  Extremity was wrapped in an Esmarch, knee flexed, tourniquet inflated to 300 mmHg. By pinching the skin, she still had intact sensation.  She was also able to dorsiflex and plantar flex her foot.  It was felt that spinal was of slow onset or not working and thus she was converted to general anesthetic.  Once she is anesthetized, then I made my incision with a 10 blade through subcutaneous tissue to the extensor mechanism.  A fresh blade was used to make a medial  parapatellar arthrotomy.  Soft tissue on the proximal medial tibia subperiosteally elevated to the joint line with the knife into the semimembranosus bursa with a Cobb elevator. Soft tissue laterally was elevated with attention being paid to avoid any patellar tendon on tibial tubercle.  The patella was everted, knee flexed 90 degrees, and ACL and PCL removed.  Drill was used to create a starting hole in the distal femur and the canal was thoroughly irrigated.  A 5-degree right valgus alignment guide was placed and the distal femoral cutting block was pinned to remove 11 mm off the distal femur.  Distal femoral resection was made with an oscillating saw.  The tibia subluxed forward and the menisci removed.  Extramedullary tibial alignment guide was placed referencing proximally at the medial aspect of the tibial tubercle and distally along the second metatarsal axis and tibial crest.  Block was pinned to remove 2 mm off the more deficient medial side.  Tibial resection was made with an oscillating saw.  Size 3 was the most appropriate tibial component.  Proximal tibia was prepared with a modular drill and keel punched for the size 3.  Femoral sizing guide was placed.  Size 3 was the most appropriate femoral component.  Rotation was marked off the epicondylar axis and  confirmed by creating rectangular flexion gap at 90 degrees.  The block was pinned in this rotation and the anterior and posterior chamfer cuts were made.  Intercondylar block was placed and that cut was made.  Trial size 3 posterior stabilized femur was placed.  A 10 mm posterior stabilized rotating platform insert trial was placed.  With the 10, full extension was achieved with excellent varus-valgus, anterior-posterior balance, throughout full range of motion.  Patella was everted, thickness measured to be 22 mm.  Freehand resection was taken to 12 mm, 35 template was placed, lug holes were drilled, trial patella  was placed, and it tracked normally.  Osteophytes removed off the posterior femur with the trial in place.  All trials were removed and the cut bone surfaces prepared with pulsatile lavage.  Cement was mixed and once ready for implantation, a size 3 mobile-bearing tibial tray, size 3 posterior stabilized femur, and 35 patella were cemented in place and patella was held with a clamp.  Trial 10 mm insert was placed, knee held in full extension, and all extruded cement removed.  When cement was fully hardened, then the permanent 10 mm posterior stabilized rotating platform insert was placed into the tibial tray.  Wound was copiously irrigated with saline solution and the arthrotomy closed over Hemovac drain with interrupted #1 PDS.  Flexion against gravity was about 135 degrees.  The tourniquet was then released for a total tourniquet time of 44 minutes.  Subcutaneous was then closed with interrupted 2-0 Vicryl and subcuticular with running 4-0 Monocryl.  Catheter for Marcaine pain pump was placed and the pump was initiated.  Incision was cleaned and dried and Steri-Strips and a bulky sterile dressing were applied.  She was then placed into a knee immobilizer, awakened, and transported to Recovery in stable condition. 7     Ollen Gross, M.D.     FA/MEDQ  D:  02/28/2011  T:  03/01/2011  Job:  [id number]  Electronically Signed by Ollen Gross M.D. on 03/10/2011 04:04:19 PM

## 2011-04-07 LAB — DIFFERENTIAL
Basophils Absolute: 0
Basophils Absolute: 0.1
Basophils Relative: 0
Basophils Relative: 2 — ABNORMAL HIGH
Eosinophils Absolute: 0.1
Lymphocytes Relative: 33
Monocytes Absolute: 0.3
Monocytes Absolute: 0.6
Monocytes Relative: 5
Neutro Abs: 3
Neutrophils Relative %: 54
Neutrophils Relative %: 63

## 2011-04-07 LAB — CBC
HCT: 40.3
HCT: 43.4
Hemoglobin: 13.7
Hemoglobin: 14.5
MCV: 80.9
RBC: 5.29 — ABNORMAL HIGH
RDW: 15
WBC: 5.6
WBC: 6

## 2011-04-07 LAB — COMPREHENSIVE METABOLIC PANEL
ALT: 22
Alkaline Phosphatase: 74
Alkaline Phosphatase: 75
BUN: 9
CO2: 29
Chloride: 100
Chloride: 103
Creatinine, Ser: 0.95
GFR calc non Af Amer: 58 — ABNORMAL LOW
Glucose, Bld: 102 — ABNORMAL HIGH
Glucose, Bld: 133 — ABNORMAL HIGH
Potassium: 3 — ABNORMAL LOW
Potassium: 3.5
Sodium: 137
Total Bilirubin: 0.7
Total Bilirubin: 0.8
Total Protein: 7.6

## 2011-04-07 LAB — URINALYSIS, ROUTINE W REFLEX MICROSCOPIC
Glucose, UA: NEGATIVE
Glucose, UA: NEGATIVE
Hgb urine dipstick: NEGATIVE
Hgb urine dipstick: NEGATIVE
Ketones, ur: NEGATIVE
Protein, ur: 100 — AB
Protein, ur: NEGATIVE
pH: 6

## 2011-04-08 LAB — COMPREHENSIVE METABOLIC PANEL
AST: 18
Alkaline Phosphatase: 71
Alkaline Phosphatase: 71
BUN: 9
BUN: 21
CO2: 31
Calcium: 9.5
Chloride: 103
Creatinine, Ser: 0.98
Creatinine, Ser: 1.74 — ABNORMAL HIGH
GFR calc Af Amer: 35 — ABNORMAL LOW
GFR calc non Af Amer: 29 — ABNORMAL LOW
Glucose, Bld: 113 — ABNORMAL HIGH
Potassium: 3.5
Total Bilirubin: 0.4
Total Protein: 6.8

## 2011-04-08 LAB — CBC
HCT: 37.8
HCT: 39
Hemoglobin: 12.8
MCHC: 33.9
MCV: 81.3
MCV: 81.5
RBC: 4.79
RDW: 14.7
WBC: 5.7

## 2011-04-08 LAB — DIFFERENTIAL
Basophils Absolute: 0
Basophils Relative: 0
Basophils Relative: 1
Eosinophils Relative: 2
Lymphocytes Relative: 30
Lymphs Abs: 2.4
Monocytes Absolute: 0.5
Monocytes Relative: 9
Neutro Abs: 5.9
Neutrophils Relative %: 65

## 2011-04-25 LAB — I-STAT 8, (EC8 V) (CONVERTED LAB)
Bicarbonate: 24.1 — ABNORMAL HIGH
Chloride: 106
HCT: 44
Hemoglobin: 15
Operator id: 123881
TCO2: 25
pCO2, Ven: 36.3 — ABNORMAL LOW
pH, Ven: 7.429 — ABNORMAL HIGH

## 2011-06-29 DIAGNOSIS — I2510 Atherosclerotic heart disease of native coronary artery without angina pectoris: Secondary | ICD-10-CM

## 2011-07-01 DIAGNOSIS — I2510 Atherosclerotic heart disease of native coronary artery without angina pectoris: Secondary | ICD-10-CM

## 2011-08-10 ENCOUNTER — Ambulatory Visit (INDEPENDENT_AMBULATORY_CARE_PROVIDER_SITE_OTHER): Payer: Self-pay | Admitting: Physician Assistant

## 2011-08-10 VITALS — BP 138/68 | HR 62 | Resp 18

## 2011-08-10 DIAGNOSIS — I4891 Unspecified atrial fibrillation: Secondary | ICD-10-CM

## 2011-08-10 DIAGNOSIS — Z951 Presence of aortocoronary bypass graft: Secondary | ICD-10-CM

## 2011-08-10 NOTE — Progress Notes (Signed)
Laurie Mays presented today for routine scheduled followup after two-vessel coronary bypass grafting. She originally presented on 06/29/2011 with acute non-ST elevation myocardial infarction. She was found to have high-grade lesions involving the left anterior descending coronary and right coronary arteries. Two-vessel coronary bypass. On uneventfully. Her postoperative course was significant for atrial fibrillation which converted back to sinus rhythm on amiodarone prior to discharge home. Since being discharged continuing to make good progress. She denies any chest pain or shortness of breath. She has begun heart strides in his please with her progress. Medications are reviewed and no changes have been made since her discharge home. On exam her heart is in a regular rate and rhythm breath sounds are clear to auscultation. The sternum is stable. She has no peripheral edema.  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 See no reason why she can't proceed with these plans. Sternotomy precautions are reviewed. No further followup is scheduled in our office she has plan followup with her cardiologist and Dr. Nada Libman is her primary care physician.

## 2012-01-02 ENCOUNTER — Other Ambulatory Visit: Payer: Self-pay | Admitting: Internal Medicine

## 2012-01-02 DIAGNOSIS — Z1231 Encounter for screening mammogram for malignant neoplasm of breast: Secondary | ICD-10-CM

## 2012-02-02 ENCOUNTER — Ambulatory Visit
Admission: RE | Admit: 2012-02-02 | Discharge: 2012-02-02 | Disposition: A | Discharge status: Home / Self Care | Payer: Medicare Other | Source: Ambulatory Visit | Attending: Internal Medicine | Admitting: Internal Medicine

## 2012-02-02 DIAGNOSIS — Z1231 Encounter for screening mammogram for malignant neoplasm of breast: Secondary | ICD-10-CM

## 2012-03-23 DIAGNOSIS — G930 Cerebral cysts: Secondary | ICD-10-CM | POA: Insufficient documentation

## 2013-01-16 ENCOUNTER — Other Ambulatory Visit: Payer: Self-pay

## 2013-01-16 DIAGNOSIS — Z1231 Encounter for screening mammogram for malignant neoplasm of breast: Secondary | ICD-10-CM

## 2013-02-05 ENCOUNTER — Ambulatory Visit
Admission: RE | Admit: 2013-02-05 | Discharge: 2013-02-05 | Disposition: A | Discharge status: Home / Self Care | Payer: Medicare PPO | Source: Ambulatory Visit

## 2013-02-05 DIAGNOSIS — Z1231 Encounter for screening mammogram for malignant neoplasm of breast: Secondary | ICD-10-CM

## 2014-05-20 DIAGNOSIS — H2510 Age-related nuclear cataract, unspecified eye: Secondary | ICD-10-CM | POA: Insufficient documentation

## 2014-05-20 DIAGNOSIS — H40053 Ocular hypertension, bilateral: Secondary | ICD-10-CM | POA: Insufficient documentation

## 2014-05-20 DIAGNOSIS — H35032 Hypertensive retinopathy, left eye: Secondary | ICD-10-CM | POA: Insufficient documentation

## 2015-11-02 DIAGNOSIS — F419 Anxiety disorder, unspecified: Secondary | ICD-10-CM | POA: Insufficient documentation

## 2015-11-02 DIAGNOSIS — M159 Polyosteoarthritis, unspecified: Secondary | ICD-10-CM | POA: Insufficient documentation

## 2015-11-02 DIAGNOSIS — H9190 Unspecified hearing loss, unspecified ear: Secondary | ICD-10-CM | POA: Insufficient documentation

## 2015-11-02 DIAGNOSIS — Z79899 Other long term (current) drug therapy: Secondary | ICD-10-CM | POA: Insufficient documentation

## 2015-11-02 DIAGNOSIS — H9319 Tinnitus, unspecified ear: Secondary | ICD-10-CM | POA: Insufficient documentation

## 2015-11-02 DIAGNOSIS — N183 Chronic kidney disease, stage 3 unspecified: Secondary | ICD-10-CM | POA: Insufficient documentation

## 2015-11-02 DIAGNOSIS — K7689 Other specified diseases of liver: Secondary | ICD-10-CM | POA: Insufficient documentation

## 2015-11-02 DIAGNOSIS — F339 Major depressive disorder, recurrent, unspecified: Secondary | ICD-10-CM | POA: Insufficient documentation

## 2015-11-02 DIAGNOSIS — R768 Other specified abnormal immunological findings in serum: Secondary | ICD-10-CM | POA: Insufficient documentation

## 2015-11-02 DIAGNOSIS — G4700 Insomnia, unspecified: Secondary | ICD-10-CM | POA: Insufficient documentation

## 2015-11-02 DIAGNOSIS — M810 Age-related osteoporosis without current pathological fracture: Secondary | ICD-10-CM | POA: Insufficient documentation

## 2015-12-17 DIAGNOSIS — T783XXA Angioneurotic edema, initial encounter: Secondary | ICD-10-CM | POA: Insufficient documentation

## 2016-12-28 DIAGNOSIS — Z6841 Body Mass Index (BMI) 40.0 and over, adult: Secondary | ICD-10-CM | POA: Insufficient documentation

## 2016-12-29 DIAGNOSIS — M79672 Pain in left foot: Secondary | ICD-10-CM | POA: Insufficient documentation

## 2017-10-17 ENCOUNTER — Ambulatory Visit: Payer: Self-pay | Admitting: Internal Medicine

## 2017-11-01 ENCOUNTER — Telehealth: Payer: Self-pay | Admitting: *Deleted

## 2017-11-01 ENCOUNTER — Ambulatory Visit (INDEPENDENT_AMBULATORY_CARE_PROVIDER_SITE_OTHER): Payer: Medicare Other | Admitting: Internal Medicine

## 2017-11-01 ENCOUNTER — Encounter: Payer: Self-pay | Admitting: Internal Medicine

## 2017-11-01 VITALS — BP 122/58 | HR 52 | Temp 97.8°F | Resp 10 | Ht 64.0 in | Wt 235.0 lb

## 2017-11-01 DIAGNOSIS — Z951 Presence of aortocoronary bypass graft: Secondary | ICD-10-CM

## 2017-11-01 DIAGNOSIS — E782 Mixed hyperlipidemia: Secondary | ICD-10-CM | POA: Diagnosis not present

## 2017-11-01 DIAGNOSIS — I10 Essential (primary) hypertension: Secondary | ICD-10-CM | POA: Diagnosis not present

## 2017-11-01 DIAGNOSIS — F0390 Unspecified dementia without behavioral disturbance: Secondary | ICD-10-CM

## 2017-11-01 DIAGNOSIS — M2550 Pain in unspecified joint: Secondary | ICD-10-CM

## 2017-11-01 DIAGNOSIS — I2510 Atherosclerotic heart disease of native coronary artery without angina pectoris: Secondary | ICD-10-CM

## 2017-11-01 DIAGNOSIS — R600 Localized edema: Secondary | ICD-10-CM

## 2017-11-01 DIAGNOSIS — Z79899 Other long term (current) drug therapy: Secondary | ICD-10-CM | POA: Diagnosis not present

## 2017-11-01 LAB — COMPLETE METABOLIC PANEL WITH GFR
AG RATIO: 1.1 (calc) (ref 1.0–2.5)
ALKALINE PHOSPHATASE (APISO): 72 U/L (ref 33–130)
ALT: 10 U/L (ref 6–29)
AST: 14 U/L (ref 10–35)
Albumin: 3.5 g/dL — ABNORMAL LOW (ref 3.6–5.1)
BILIRUBIN TOTAL: 0.6 mg/dL (ref 0.2–1.2)
BUN/Creatinine Ratio: 11 (calc) (ref 6–22)
BUN: 11 mg/dL (ref 7–25)
CHLORIDE: 107 mmol/L (ref 98–110)
CO2: 28 mmol/L (ref 20–32)
Calcium: 9 mg/dL (ref 8.6–10.4)
Creat: 0.97 mg/dL — ABNORMAL HIGH (ref 0.60–0.93)
GFR, Est African American: 64 mL/min/{1.73_m2} (ref >=60)
GFR, Est Non African American: 56 mL/min/{1.73_m2} — ABNORMAL LOW (ref >=60)
Globulin: 3.2 g/dL (calc) (ref 1.9–3.7)
Glucose, Bld: 93 mg/dL (ref 65–99)
Potassium: 3.7 mmol/L (ref 3.5–5.3)
Sodium: 139 mmol/L (ref 135–146)
Total Protein: 6.7 g/dL (ref 6.1–8.1)

## 2017-11-01 LAB — CBC WITH DIFFERENTIAL/PLATELET
BASOS ABS: 29 {cells}/uL (ref 0–200)
Basophils Relative: 0.7 %
Eosinophils Absolute: 210 cells/uL (ref 15–500)
Eosinophils Relative: 5 %
HEMATOCRIT: 38.4 % (ref 35.0–45.0)
Hemoglobin: 12.7 g/dL (ref 11.7–15.5)
LYMPHS ABS: 1504 {cells}/uL (ref 850–3900)
MCH: 27.7 pg (ref 27.0–33.0)
MCHC: 33.1 g/dL (ref 32.0–36.0)
MCV: 83.7 fL (ref 80.0–100.0)
MPV: 11.4 fL (ref 7.5–12.5)
Monocytes Relative: 10.7 %
NEUTROS PCT: 47.8 %
Neutro Abs: 2008 cells/uL (ref 1500–7800)
Platelets: 182 10*3/uL (ref 140–400)
RBC: 4.59 10*6/uL (ref 3.80–5.10)
RDW: 13.8 % (ref 11.0–15.0)
Total Lymphocyte: 35.8 %
WBC: 4.2 10*3/uL (ref 3.8–10.8)
WBCMIX: 449 {cells}/uL (ref 200–950)

## 2017-11-01 LAB — LIPID PANEL
CHOLESTEROL: 161 mg/dL (ref <200)
HDL: 59 mg/dL (ref >50)
LDL Cholesterol (Calc): 88 mg/dL (calc)
Non-HDL Cholesterol (Calc): 102 mg/dL (calc) (ref <130)
TRIGLYCERIDES: 62 mg/dL (ref <150)
Total CHOL/HDL Ratio: 2.7 (calc) (ref <5.0)

## 2017-11-01 LAB — TSH: TSH: 1.11 mIU/L (ref 0.40–4.50)

## 2017-11-01 MED ORDER — ATORVASTATIN CALCIUM 40 MG PO TABS: 40.0000 mg | ORAL_TABLET | Freq: Every day | ORAL | 1 refills | Status: DC

## 2017-11-01 MED ORDER — DICLOFENAC SODIUM 1 % TD GEL: 4.0000 g | Freq: Four times a day (QID) | TRANSDERMAL | 6 refills | Status: DC

## 2017-11-01 NOTE — Telephone Encounter (Signed)
Received fax from [facility] requesting prior authorization for Diclofenac Sodium 1%. Initiated through [employer]. Went into determination. Response in 48-72 hours.  [id number] XBM:WUXLKGKey:[id number]  Outcome: APPROVED through 07/10/2018 Reference #: [id number]

## 2017-11-01 NOTE — Patient Instructions (Addendum)
Bring copy of Advance Directives- Health Care Power of Attorney and/or Living Will to next appointment.   START VOLTAREN GEL - APPLY 4GRAMS TO BOTH KNEES 4 TIMES DAILY FOR JOINT PAIN AND SWELLING. MAY APPLY 2 GRAMS TO HANDS FOR SWELLING  Continue other medications as ordered  Will call with lab results  Follow up in 1-2 mos for AWV/CPE/ECG

## 2017-11-01 NOTE — Progress Notes (Signed)
Patient ID: Laurie Mays, female   DOB: Nov 02, 1940, 77 y.o.   MRN: [id number]   Location:  [facility] OFFICE  Provider: DR Arletha Grippe  Code Status:  Goals of Care:  Advanced Directives 11/01/2017  Does Patient Have a Medical Advance Directive? Yes  Type of Advance Directive Living will     Chief Complaint  Patient presents with  . Establish Care    New Patient establish care, patient c/o bilateral knee stiffness and anxiety. Here with daughterThreasa Beards. Patient and husband live next door to daughter.   . Advance Care Planning    Living will, copy requested   . Medication Refill    Atorvastain- [facility] day supply[facility] [place]     HPI: Patient is a 77 y.o. female seen today for as a new pt. Previous PCP Dr Ivette Loyal in [place]. She c/o joint pain and swelling. She is a poor historian due to dementia. Hx obtained from chart.   HTN - stable on metoprolol and lasix  Hyperlipidemia - stable on lipitor  Dementia with anxiousness - mood stable on citalopram. She takes aricept dementia. No diarrhea or nightmares. She was intolerant of namenda (increased behaviors)   Past Medical History:  Diagnosis Date  . Gallbladder disorder 2009   Per New Patient Packet   . High blood pressure    Per New Patient Packet     Past Surgical History:  Procedure Laterality Date  . ABDOMINAL HYSTERECTOMY     Per New Patient Packet   . CARDIAC SURGERY  2011   Open Heart Surgey, Per New Patient Packet    . REPLACEMENT TOTAL KNEE Left 2010   Per New Patient Packet      reports that she quit smoking about 30 years ago. She has never used smokeless tobacco. She reports that she has current or past drug history. She reports that she does not drink alcohol. Social History   Socioeconomic History  . Marital status: Married    Spouse name: Not on file  . Number of children: Not on file  . Years of education: Not on file  . Highest education level: Not on file  Occupational History  . Not on  file  Social Needs  . Financial resource strain: Not on file  . Food insecurity:    Worry: Not on file    Inability: Not on file  . Transportation needs:    Medical: Not on file    Non-medical: Not on file  Tobacco Use  . Smoking status: Former Smoker    Last attempt to quit: 07/12/1987    Years since quitting: 30.3  . Smokeless tobacco: Never Used  . Tobacco comment: Quit about 30 years ago as of 2019   Substance and Sexual Activity  . Alcohol use: Never    Frequency: Never  . Drug use: Not Currently  . Sexual activity: Not on file  Lifestyle  . Physical activity:    Days per week: Not on file    Minutes per session: Not on file  . Stress: Not on file  Relationships  . Social connections:    Talks on phone: Not on file    Gets together: Not on file    Attends religious service: Not on file    Active member of club or organization: Not on file    Attends meetings of clubs or organizations: Not on file    Relationship status: Not on file  . Intimate partner violence:  Fear of current or ex partner: Not on file    Emotionally abused: Not on file    Physically abused: Not on file    Forced sexual activity: Not on file  Other Topics Concern  . Not on file  Social History Narrative   Diet: N/A      Caffeine: Coffee and Pepsi      Married, if yes what year: Yes, 1961      Do you live in a house, apartment, assisted living, condo, trailer, ect: House, one stories, 1 person       Pets: None      Current/Past profession: Control and instrumentation engineer       Exercise: No          Living Will: Yes   DNR:   POA/HPOA:      Functional Status:   Do you have difficulty bathing or dressing yourself? No   Do you have difficulty preparing food or eating? Yes   Do you have difficulty managing your medications? Yes    Do you have difficulty managing your finances? Yes    Do you have difficulty affording your medications? No    Family History  Problem Relation Age of Onset  .  Dementia Mother   . Arthritis Mother   . Heart attack Father   . Arthritis Father   . Heart attack Sister   . Arthritis Sister     Allergies  Allergen Reactions  . Lisinopril Other (See Comments)  . Losartan Other (See Comments)  . Memantine Other (See Comments)  . Peanut-Containing Drug Products   . Tramadol Hives    Outpatient Encounter Medications as of 11/01/2017  Medication Sig  . atorvastatin (LIPITOR) 40 MG tablet Take 40 mg by mouth daily.  . citalopram (CELEXA) 20 MG tablet Take 20 mg by mouth daily.  . diphenhydrAMINE (BENADRYL) 25 MG tablet Take 25 mg by mouth as needed.  . donepezil (ARICEPT) 10 MG tablet Take 10 mg by mouth at bedtime.  . furosemide (LASIX) 20 MG tablet Take 20 mg by mouth daily.  . metoprolol tartrate (LOPRESSOR) 25 MG tablet Take 25 mg by mouth 2 (two) times daily.  . Multiple Vitamins-Minerals (CENTRUM PO) Take by mouth daily.  . [DISCONTINUED] amiodarone (PACERONE) 200 MG tablet Take 200 mg by mouth 2 (two) times daily.  . [DISCONTINUED] aspirin EC 81 MG tablet Take 81 mg by mouth daily.  . [DISCONTINUED] doxycycline (ADOXA) 100 MG tablet Take 100 mg by mouth 2 (two) times daily.  . [DISCONTINUED] triamterene-hydrochlorothiazide (MAXZIDE) 75-50 MG per tablet Take 1 tablet by mouth every Monday,Wednesday,Friday, and Sunday at 6 PM.   No facility-administered encounter medications on file as of 11/01/2017.     Review of Systems:  Review of Systems  HENT: Positive for dental problem (wears dentures), hearing loss, sinus pressure and tinnitus.   Genitourinary: Positive for frequency.       Has urinary incontinence  Musculoskeletal: Positive for arthralgias (and joint stiffness).       Loss of balance (+)  Skin: Positive for color change.       (+) itchy; nail abnormality  Psychiatric/Behavioral: Positive for agitation, confusion (with memory loss), dysphoric mood and sleep disturbance. The patient is nervous/anxious.        (+) stress  All  other systems reviewed and are negative. per NP packet  Health Maintenance  Topic Date Due  . TETANUS/TDAP  07/27/1957  . DEXA SCAN  07/28/2003  . PNA vac Low Risk  Adult (1 of 2 - PCV13) 07/28/2003  . INFLUENZA VACCINE  02/08/2018    Physical Exam: Vitals:   11/01/17 0830  BP: (!) 122/58  Pulse: (!) 52  Resp: 10  Temp: 97.8 F (36.6 C)  TempSrc: Oral  SpO2: 96%  Weight: 235 lb (106.6 kg)  Height: '5\' 4"'$  (1.626 m)   Body mass index is 40.34 kg/m. Physical Exam  Constitutional: She is oriented to person, place, and time. She appears well-developed and well-nourished.  HENT:  Mouth/Throat: Oropharynx is clear and moist. No oropharyngeal exudate.  Eyes: Pupils are equal, round, and reactive to light. No scleral icterus.  Neck: Neck supple. Carotid bruit is not present. No tracheal deviation present. No thyromegaly present.  Cardiovascular: Regular rhythm and intact distal pulses. Bradycardia present. Exam reveals no gallop and no friction rub.  Murmur (1/6 SEM) heard. Trace L>RLE edema b/l. no calf TTP.   Pulmonary/Chest: Effort normal and breath sounds normal. No stridor. No respiratory distress. She has no wheezes. She has no rales.  Abdominal: Soft. Bowel sounds are normal. She exhibits no distension and no mass. There is no hepatomegaly. There is no tenderness. There is no rebound and no guarding.  obese  Musculoskeletal: She exhibits edema (small and large joints; L>R knee) and tenderness.  Lymphadenopathy:    She has no cervical adenopathy.  Neurological: She is alert and oriented to person, place, and time. She has normal reflexes. Gait (antalgic) abnormal.  Skin: Skin is warm and dry. No rash noted.  Psychiatric: She has a normal mood and affect. Her behavior is normal. Judgment and thought content normal. Cognition and memory are impaired.    Labs reviewed: Basic Metabolic Panel: No results for input(s): NA, K, CL, CO2, GLUCOSE, BUN, CREATININE, CALCIUM, MG, PHOS,  TSH in the last 8760 hours. Liver Function Tests: No results for input(s): AST, ALT, ALKPHOS, BILITOT, PROT, ALBUMIN in the last 8760 hours. No results for input(s): LIPASE, AMYLASE in the last 8760 hours. No results for input(s): AMMONIA in the last 8760 hours. CBC: No results for input(s): WBC, NEUTROABS, HGB, HCT, MCV, PLT in the last 8760 hours. Lipid Panel: No results for input(s): CHOL, HDL, LDLCALC, TRIG, CHOLHDL, LDLDIRECT in the last 8760 hours. No results found for: HGBA1C  Procedures since last visit: No results found.  Assessment/Plan   ICD-10-CM   1. Arthralgia of multiple joints M25.50 diclofenac sodium (VOLTAREN) 1 % GEL  2. Essential hypertension I10 CBC with Differential/Platelets    Urinalysis with Reflex Microscopic  3. Bilateral leg edema R60.0   4. Mixed hyperlipidemia E78.2 atorvastatin (LIPITOR) 40 MG tablet    Lipid Panel    TSH  5. Coronary artery disease involving native coronary artery of native heart without angina pectoris I25.10   6. 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 Dementia without behavioral disturbance, unspecified dementia type F03.90   8. High risk medication use Z79.899 CMP with eGFR([facility])    Will discuss cardiology next ov  Will consider addition of buspar pending lab results  Bring copy of [place] and/or Living Will to next appointment.   START VOLTAREN GEL - APPLY 4GRAMS TO BOTH KNEES 4 TIMES DAILY FOR JOINT PAIN AND SWELLING. MAY APPLY 2 GRAMS TO HANDS FOR SWELLING  Continue other medications as ordered  Will call with lab results  Follow up in 1-2 mos for AWV/CPE/ECG      Jenice Leiner S. Eulas Post, D. O., F. [place]  [employer]  [facility] [place], [place] [address] [phone] Cell (Monday-Friday 8 AM - 5 PM) [phone] After 5 PM and follow prompts

## 2017-11-02 ENCOUNTER — Telehealth: Payer: Self-pay

## 2017-11-02 LAB — URINALYSIS, ROUTINE W REFLEX MICROSCOPIC
Bilirubin Urine: NEGATIVE
Glucose, UA: NEGATIVE
Hgb urine dipstick: NEGATIVE
Hyaline Cast: NONE SEEN /LPF
Ketones, ur: NEGATIVE
Nitrite: POSITIVE — AB
PH: 6.5 (ref 5.0–8.0)
SPECIFIC GRAVITY, URINE: 1.021 (ref 1.001–1.03)

## 2017-11-02 MED ORDER — CEPHALEXIN 500 MG PO CAPS: 500.0000 mg | ORAL_CAPSULE | Freq: Two times a day (BID) | ORAL | 0 refills | Status: DC

## 2017-11-02 NOTE — Telephone Encounter (Signed)
-----   Message from [place], [place] sent at 11/02/2017  3:54 PM EDT ----- Just send rx to pharmacy please

## 2017-11-02 NOTE — Telephone Encounter (Signed)
I spoke with patient's daughter and she verbalized understanding. She did not have any questions at this time. She did state that patient has been very aggressive today.   Rx has been sent to pharmacy

## 2017-11-03 ENCOUNTER — Telehealth: Payer: Self-pay

## 2017-11-03 NOTE — Telephone Encounter (Signed)
-----   Message from [place], [place] sent at 11/02/2017  1:38 PM EDT ----- Urine reveals UTI - please send urine culture and sens; start keflex 500mg  #14 take 1 tab po BID with no RF; other labs stable; follow up as scheduled

## 2017-11-03 NOTE — Telephone Encounter (Signed)
Spoke with Laurie Mays, Laurie Mays was already informed of lab results.   Laurie Mays requested that I call her mom and tell her she needs to drink more water and lay off of pepsi, this is something Laurie Mays has tried to tell her before and thinks she may listen if someone else informs her.  I called Laurie Mays as requested by her daughter and she laughed at the recommendation and said she would try to drink more water.

## 2017-11-03 NOTE — Telephone Encounter (Signed)
Left message on voicemail for patient to return call when available   

## 2017-11-15 ENCOUNTER — Ambulatory Visit: Payer: Self-pay | Admitting: Internal Medicine

## 2017-12-05 ENCOUNTER — Encounter: Payer: Medicare Other | Admitting: Internal Medicine

## 2017-12-08 ENCOUNTER — Encounter: Payer: Medicare Other | Admitting: Internal Medicine

## 2017-12-14 ENCOUNTER — Telehealth: Payer: Self-pay

## 2017-12-14 NOTE — Telephone Encounter (Signed)
Earlean PolkaMelanie Dark, the patient's daughter is requesting some sort of documentation for their records diagnosing the patient with Dementia, to get things out of her name. Shawna OrleansMelanie is not POA as of yet. Shawna OrleansMelanie can be reached at [phone], message can be left on voicemail. Routed to Dr. Montez Moritaarter.

## 2017-12-14 NOTE — Telephone Encounter (Signed)
Discussed with Melanie

## 2017-12-14 NOTE — Telephone Encounter (Signed)
That information is documented in her last visit. It should also appear on her April office visit summary

## 2017-12-15 ENCOUNTER — Encounter (INDEPENDENT_AMBULATORY_CARE_PROVIDER_SITE_OTHER): Payer: Self-pay

## 2017-12-25 ENCOUNTER — Encounter: Payer: Self-pay | Admitting: Nurse Practitioner

## 2017-12-25 ENCOUNTER — Ambulatory Visit (INDEPENDENT_AMBULATORY_CARE_PROVIDER_SITE_OTHER): Payer: Medicare Other | Admitting: Nurse Practitioner

## 2017-12-25 VITALS — BP 126/62 | HR 64 | Temp 98.6°F | Ht 64.0 in | Wt 236.0 lb

## 2017-12-25 DIAGNOSIS — J40 Bronchitis, not specified as acute or chronic: Secondary | ICD-10-CM

## 2017-12-25 NOTE — Patient Instructions (Addendum)
MUCINEX DM By mouth twice daily with full glass of water for 7 days.  Increase fluid intake  To call if symptoms worsen or fail to improve    Acute Bronchitis, Adult Acute bronchitis is when air tubes (bronchi) in the lungs suddenly get swollen. The condition can make it hard to breathe. It can also cause these symptoms:  A cough.  Coughing up clear, yellow, or green mucus.  Wheezing.  Chest congestion.  Shortness of breath.  A fever.  Body aches.  Chills.  A sore throat.  Follow these instructions at home: Medicines  Take over-the-counter and prescription medicines only as told by your doctor.  If you were prescribed an antibiotic medicine, take it as told by your doctor. Do not stop taking the antibiotic even if you start to feel better. General instructions  Rest.  Drink enough fluids to keep your pee (urine) clear or pale yellow.  Avoid smoking and secondhand smoke. If you smoke and you need help quitting, ask your doctor. Quitting will help your lungs heal faster.  Use an inhaler, cool mist vaporizer, or humidifier as told by your doctor.  Keep all follow-up visits as told by your doctor. This is important. How is this prevented? To lower your risk of getting this condition again:  Wash your hands often with soap and water. If you cannot use soap and water, use hand sanitizer.  Avoid contact with people who have cold symptoms.  Try not to touch your hands to your mouth, nose, or eyes.  Make sure to get the flu shot every year.  Contact a doctor if:  Your symptoms do not get better in 2 weeks. Get help right away if:  You cough up blood.  You have chest pain.  You have very bad shortness of breath.  You become dehydrated.  You faint (pass out) or keep feeling like you are going to pass out.  You keep throwing up (vomiting).  You have a very bad headache.  Your fever or chills gets worse. This information is not intended to replace advice  given to you by your health care provider. Make sure you discuss any questions you have with your health care provider. Document Released: 12/14/2007 Document Revised: 02/03/2016 Document Reviewed: 12/16/2015 Elsevier Interactive Patient Education  [employer]2018 [employer]

## 2017-12-25 NOTE — Progress Notes (Signed)
Careteam: Patient Care Team: Laurie Mays, Monica, DO as PCP - General (Internal Medicine)  Advanced Directive information Does Patient Have a Medical Advance Directive?: No  Allergies  Allergen Reactions  . Lisinopril Other (See Comments)  . Losartan Other (See Comments)  . Memantine Other (See Comments)    combative  . Peanut-Containing Drug Products   . Tramadol Hives    Chief Complaint  Patient presents with  . Follow-up    Pt is being seen for an ED follow up. Pt was recently seen at [facility] ED on 12/23/17 for bronchitis. Pt was treated with prednisone taper and z pak.      HPI: Patient is a 77 y.o. female seen in the office today due to bronchitis follow up. Pt was seen in the ED Saturday 6/15 due to cough and congestion and they diagnosed her with bronchitis and placed her on azithromycin and prednisone. She had symptoms for 1 day prior to going to ED.  Husband gave her all of her azithromycin in 3 days. Took last dose today of azithromycin and prednisone. Overall feels better but with ongoing cough.  Decrease appetite.  Drinks a lot of coffee, not much water per husband.  No fever or chills.    Review of Systems:  Review of Systems  Constitutional: Positive for malaise/fatigue. Negative for chills and fever.  HENT: Negative for congestion, sinus pain and sore throat.   Eyes: Negative.   Respiratory: Positive for cough. Negative for sputum production, shortness of breath and wheezing.   Cardiovascular: Negative for chest pain and palpitations.  Neurological: Negative for dizziness and headaches.    Past Medical History:  Diagnosis Date  . Gallbladder disorder 2009   Per New Patient Packet   . High blood pressure    Per New Patient Packet    Past Surgical History:  Procedure Laterality Date  . ABDOMINAL HYSTERECTOMY     Per New Patient Packet   . CARDIAC SURGERY  2011   Open Heart Surgey, Per New Patient Packet    . REPLACEMENT TOTAL KNEE Left 2010   Per  New Patient Packet    Social History:   reports that she quit smoking about 30 years ago. She has never used smokeless tobacco. She reports that she has current or past drug history. She reports that she does not drink alcohol.  Family History  Problem Relation Age of Onset  . Dementia Mother   . Arthritis Mother   . Heart attack Father   . Arthritis Father   . Heart attack Sister   . Arthritis Sister     Medications: Patient's Medications  New Prescriptions   No medications on file  Previous Medications   ATORVASTATIN (LIPITOR) 40 MG TABLET    Take 1 tablet (40 mg total) by mouth daily.   CITALOPRAM (CELEXA) 20 MG TABLET    Take 20 mg by mouth daily.   DICLOFENAC SODIUM (VOLTAREN) 1 % GEL    Apply 4 g topically 4 (four) times daily. To both knees. May apply 2 gm to hands QID for arthritis   DIPHENHYDRAMINE (BENADRYL) 25 MG TABLET    Take 25 mg by mouth as needed.   DONEPEZIL (ARICEPT) 10 MG TABLET    Take 10 mg by mouth at bedtime.   FUROSEMIDE (LASIX) 20 MG TABLET    Take 20 mg by mouth daily.   METOPROLOL TARTRATE (LOPRESSOR) 25 MG TABLET    Take 25 mg by mouth 2 (two) times daily.  MULTIPLE VITAMINS-MINERALS (CENTRUM PO)    Take by mouth daily.  Modified Medications   No medications on file  Discontinued Medications   CEPHALEXIN (KEFLEX) 500 MG CAPSULE    Take 1 capsule (500 mg total) by mouth 2 (two) times daily.     Physical Exam:  Vitals:   12/25/17 1533  BP: 126/62  Pulse: 64  Temp: 98.6 F (37 C)  TempSrc: Oral  SpO2: 98%  Weight: 236 lb (107 kg)  Height: 5\' 4"  (1.626 m)   Body mass index is 40.51 kg/m.  Physical Exam  Constitutional: She appears well-developed and well-nourished.  HENT:  Head: Normocephalic and atraumatic.  Right Ear: External ear normal.  Left Ear: External ear normal.  Nose: Nose normal.  Mouth/Throat: Oropharynx is clear and moist. No oropharyngeal exudate.  Eyes: Pupils are equal, round, and reactive to light. Conjunctivae and  EOM are normal.  Neck: Normal range of motion. Neck supple.  Cardiovascular: Normal rate, regular rhythm and normal heart sounds.  Pulmonary/Chest: Effort normal and breath sounds normal.  Lymphadenopathy:    She has no cervical adenopathy.  Neurological: She is alert.    Labs reviewed: Basic Metabolic Panel: Recent Labs    11/01/17 0948  NA 139  K 3.7  CL 107  CO2 28  GLUCOSE 93  BUN 11  CREATININE 0.97*  CALCIUM 9.0  TSH 1.11   Liver Function Tests: Recent Labs    11/01/17 0948  AST 14  ALT 10  BILITOT 0.6  PROT 6.7   No results for input(s): LIPASE, AMYLASE in the last 8760 hours. No results for input(s): AMMONIA in the last 8760 hours. CBC: Recent Labs    11/01/17 0948  WBC 4.2  NEUTROABS 2,008  HGB 12.7  HCT 38.4  MCV 83.7  PLT 182   Lipid Panel: Recent Labs    11/01/17 0948  CHOL 161  HDL 59  LDLCALC 88  TRIG 62  CHOLHDL 2.7   TSH: Recent Labs    11/01/17 0948  TSH 1.11   A1C: No results found for: HGBA1C   Assessment/Plan 1. Bronchitis Has completed azithromycin and prednisone. Overall doing better but still has ongoing cough.  To use mucinex DM by mouth twice daily with full glass of water for 7 days. To increase fluid/water intake at this time Return/call precautions discussed with pt and husband.   Next appt: 01/19/2018 Laurie Mays. Biagio Borg  [facility] & Adult Medicine [phone]

## 2017-12-28 ENCOUNTER — Telehealth: Payer: Self-pay | Admitting: Internal Medicine

## 2017-12-28 NOTE — Telephone Encounter (Signed)
Left msg w/ pt's dtr asking her to confirm this AWV-I w/ nurse. VDM (DD)

## 2017-12-29 ENCOUNTER — Telehealth: Payer: Self-pay

## 2017-12-29 ENCOUNTER — Ambulatory Visit (INDEPENDENT_AMBULATORY_CARE_PROVIDER_SITE_OTHER): Payer: Medicare Other | Admitting: Nurse Practitioner

## 2017-12-29 ENCOUNTER — Encounter: Payer: Self-pay | Admitting: Nurse Practitioner

## 2017-12-29 VITALS — BP 136/82 | HR 84 | Temp 98.4°F | Ht 64.0 in | Wt 231.0 lb

## 2017-12-29 DIAGNOSIS — M25531 Pain in right wrist: Secondary | ICD-10-CM

## 2017-12-29 DIAGNOSIS — R059 Cough, unspecified: Secondary | ICD-10-CM

## 2017-12-29 DIAGNOSIS — R05 Cough: Secondary | ICD-10-CM | POA: Diagnosis not present

## 2017-12-29 MED ORDER — PREDNISONE 10 MG (21) PO TBPK: ORAL_TABLET | ORAL | 0 refills | Status: DC

## 2017-12-29 MED ORDER — BENZONATATE 100 MG PO CAPS: 100.0000 mg | ORAL_CAPSULE | Freq: Three times a day (TID) | ORAL | 0 refills | Status: DC | PRN

## 2017-12-29 NOTE — Patient Instructions (Signed)
Increase mucinex DM by mouth twice daily 1-2 tablets twice daily Start with 1 tablet twice daily but can increase up to 2 tablets twice daily  To start prednisone taper this should benefit cough and wrist  Keep hydrated

## 2017-12-29 NOTE — Telephone Encounter (Signed)
Shawna OrleansMelanie the patient's daughter states the patients went to ER for bronchitis and was office seen in office recently. Still having SOB especially with her cough. She feels as though she need more oxygen and the mucinex hasn't helped much. They would like to come in today if possible.   Scheduled patient for today to see Shanda BumpsJessica.

## 2017-12-29 NOTE — Progress Notes (Signed)
Careteam: Patient Care Team: Kirt Boysarter, Monica, DO as PCP - General (Internal Medicine)  Advanced Directive information Does Patient Have a Medical Advance Directive?: No  Allergies  Allergen Reactions  . Lisinopril Other (See Comments)  . Losartan Other (See Comments)  . Memantine Other (See Comments)    combative  . Peanut-Containing Drug Products   . Tramadol Hives    Chief Complaint  Patient presents with  . Follow-up    Pt is being seen for ongoin cough that causes SOB. Pt starts coughing and then she cannot catch her breath.     HPI: Patient is a 77 y.o. female seen in the office today due to persistent shortness of breath with cough. She was placed on azithromycin with prednisone taper. Reports she is coughing so long her husband is worried she will pass out. Can not get her breath. Otherwise no shortness of breath. No fever. Congestion has improved but can not get cough to stop. Has increased water intake.  Eating better.  Hx of bronchitis.   Also right arm is causing her a lot of pain- reports she has arthritis. Wrist is warm with decrease ROM. Without injury. Limited HPI/ROS due to dementia.   Review of Systems:  Review of Systems  Constitutional: Negative for chills and fever.  Respiratory: Positive for cough and shortness of breath. Negative for sputum production and wheezing.   Cardiovascular: Negative for chest pain and palpitations.  Musculoskeletal: Positive for joint pain and myalgias.    Past Medical History:  Diagnosis Date  . Gallbladder disorder 2009   Per New Patient Packet   . High blood pressure    Per New Patient Packet    Past Surgical History:  Procedure Laterality Date  . ABDOMINAL HYSTERECTOMY     Per New Patient Packet   . CARDIAC SURGERY  2011   Open Heart Surgey, Per New Patient Packet    . REPLACEMENT TOTAL KNEE Left 2010   Per New Patient Packet    Social History:   reports that she quit smoking about 30 years ago. She has  never used smokeless tobacco. She reports that she has current or past drug history. She reports that she does not drink alcohol.  Family History  Problem Relation Age of Onset  . Dementia Mother   . Arthritis Mother   . Heart attack Father   . Arthritis Father   . Heart attack Sister   . Arthritis Sister     Medications: Patient's Medications  New Prescriptions   No medications on file  Previous Medications   ATORVASTATIN (LIPITOR) 40 MG TABLET    Take 1 tablet (40 mg total) by mouth daily.   CITALOPRAM (CELEXA) 20 MG TABLET    Take 20 mg by mouth daily.   DEXTROMETHORPHAN-GUAIFENESIN (MUCINEX DM) 30-600 MG 12HR TABLET    Take 1 tablet by mouth 2 (two) times daily.   DICLOFENAC SODIUM (VOLTAREN) 1 % GEL    Apply 4 g topically 4 (four) times daily. To both knees. May apply 2 gm to hands QID for arthritis   DIPHENHYDRAMINE (BENADRYL) 25 MG TABLET    Take 25 mg by mouth as needed.   DONEPEZIL (ARICEPT) 10 MG TABLET    Take 10 mg by mouth at bedtime.   FUROSEMIDE (LASIX) 20 MG TABLET    Take 20 mg by mouth daily.   METOPROLOL TARTRATE (LOPRESSOR) 25 MG TABLET    Take 25 mg by mouth 2 (two) times daily.  MULTIPLE VITAMINS-MINERALS (CENTRUM PO)    Take by mouth daily.  Modified Medications   No medications on file  Discontinued Medications   No medications on file     Physical Exam:  Vitals:   12/29/17 1057  BP: 136/82  Pulse: 84  Temp: 98.4 F (36.9 C)  TempSrc: Oral  SpO2: 96%  Weight: 231 lb (104.8 kg)  Height: 5\' 4"  (1.626 m)   Body mass index is 39.65 kg/m.  Physical Exam  Constitutional: She appears well-developed and well-nourished.  HENT:  Head: Normocephalic and atraumatic.  Right Ear: External ear normal.  Left Ear: External ear normal.  Nose: Nose normal.  Mouth/Throat: Oropharynx is clear and moist. No oropharyngeal exudate.  Eyes: Pupils are equal, round, and reactive to light. Conjunctivae and EOM are normal.  Neck: Normal range of motion. Neck  supple.  Cardiovascular: Normal rate, regular rhythm and normal heart sounds.  Pulmonary/Chest: Effort normal and breath sounds normal. No respiratory distress.  Musculoskeletal:       Right wrist: She exhibits decreased range of motion, tenderness and swelling.  Lymphadenopathy:    She has no cervical adenopathy.  Neurological: She is alert.  Skin: Skin is warm and dry.    Labs reviewed: Basic Metabolic Panel: Recent Labs    11/01/17 0948  NA 139  K 3.7  CL 107  CO2 28  GLUCOSE 93  BUN 11  CREATININE 0.97*  CALCIUM 9.0  TSH 1.11   Liver Function Tests: Recent Labs    11/01/17 0948  AST 14  ALT 10  BILITOT 0.6  PROT 6.7   No results for input(s): LIPASE, AMYLASE in the last 8760 hours. No results for input(s): AMMONIA in the last 8760 hours. CBC: Recent Labs    11/01/17 0948  WBC 4.2  NEUTROABS 2,008  HGB 12.7  HCT 38.4  MCV 83.7  PLT 182   Lipid Panel: Recent Labs    11/01/17 0948  CHOL 161  HDL 59  LDLCALC 88  TRIG 62  CHOLHDL 2.7   TSH: Recent Labs    11/01/17 0948  TSH 1.11   A1C: No results found for: HGBA1C   Assessment/Plan 1. Acute pain of right wrist -appears to be an arthritis flare. No hx of gout. Will treat with taper, to follow up if symptoms worsen or fail to improve - predniSONE (STERAPRED UNI-PAK 21 TAB) 10 MG (21) TBPK tablet; Use as directed  Dispense: 21 tablet; Refill: 0  2. Cough Ongoing, with major "fits" that cause increase stress to her and her husband.  -taking mucinex DM daily, increase to 1-2 tablets by mouth twice daily  - predniSONE (STERAPRED UNI-PAK 21 TAB) 10 MG (21) TBPK tablet; Use as directed  Dispense: 21 tablet; Refill: 0 - benzonatate (TESSALON) 100 MG capsule; Take 1 capsule (100 mg total) by mouth 3 (three) times daily as needed for cough.  Dispense: 30 capsule; Refill: 0 -increase fluid intake.   Next appt: 01/19/2018 Janene Harvey. Biagio Borg  [facility] & Adult Medicine [phone]

## 2018-01-03 ENCOUNTER — Telehealth: Payer: Self-pay

## 2018-01-03 NOTE — Telephone Encounter (Signed)
I left a message for patient's husband to call the office. I called to see how patient was feeling and to see if she was still having a cough and wrist/arm pain.

## 2018-01-19 ENCOUNTER — Encounter: Payer: Self-pay | Admitting: Internal Medicine

## 2018-01-19 ENCOUNTER — Ambulatory Visit (INDEPENDENT_AMBULATORY_CARE_PROVIDER_SITE_OTHER): Payer: Medicare Other | Admitting: Internal Medicine

## 2018-01-19 ENCOUNTER — Ambulatory Visit (INDEPENDENT_AMBULATORY_CARE_PROVIDER_SITE_OTHER): Payer: Medicare Other

## 2018-01-19 VITALS — BP 152/62 | HR 57 | Temp 97.9°F | Ht 64.0 in | Wt 230.0 lb

## 2018-01-19 DIAGNOSIS — E2839 Other primary ovarian failure: Secondary | ICD-10-CM

## 2018-01-19 DIAGNOSIS — I2510 Atherosclerotic heart disease of native coronary artery without angina pectoris: Secondary | ICD-10-CM

## 2018-01-19 DIAGNOSIS — Z1231 Encounter for screening mammogram for malignant neoplasm of breast: Secondary | ICD-10-CM | POA: Diagnosis not present

## 2018-01-19 DIAGNOSIS — Z23 Encounter for immunization: Secondary | ICD-10-CM | POA: Diagnosis not present

## 2018-01-19 DIAGNOSIS — F03918 Unspecified dementia, unspecified severity, with other behavioral disturbance: Secondary | ICD-10-CM | POA: Insufficient documentation

## 2018-01-19 DIAGNOSIS — I10 Essential (primary) hypertension: Secondary | ICD-10-CM

## 2018-01-19 DIAGNOSIS — M2540 Effusion, unspecified joint: Secondary | ICD-10-CM | POA: Diagnosis not present

## 2018-01-19 DIAGNOSIS — Z0000 Encounter for general adult medical examination without abnormal findings: Secondary | ICD-10-CM

## 2018-01-19 DIAGNOSIS — Z1239 Encounter for other screening for malignant neoplasm of breast: Secondary | ICD-10-CM

## 2018-01-19 DIAGNOSIS — F0390 Unspecified dementia without behavioral disturbance: Secondary | ICD-10-CM | POA: Diagnosis not present

## 2018-01-19 DIAGNOSIS — R001 Bradycardia, unspecified: Secondary | ICD-10-CM

## 2018-01-19 DIAGNOSIS — E782 Mixed hyperlipidemia: Secondary | ICD-10-CM | POA: Diagnosis not present

## 2018-01-19 DIAGNOSIS — J309 Allergic rhinitis, unspecified: Secondary | ICD-10-CM

## 2018-01-19 DIAGNOSIS — L720 Epidermal cyst: Secondary | ICD-10-CM | POA: Insufficient documentation

## 2018-01-19 DIAGNOSIS — R600 Localized edema: Secondary | ICD-10-CM | POA: Diagnosis not present

## 2018-01-19 NOTE — Patient Instructions (Addendum)
Continue current medications as ordered  RECOMMEND OTC PLAIN CLARITIN OR ZYRTEC DAILY FOR SEASONAL ALLERGY   Will call with cardiology referral  cologuard as ordered for colon cancer screening  Follow up for bone density scan and mammogram  Follow up in 4 mos for HTN, CAD, dementia, hyperlipidemia. Fasting labs prior to appt  Keeping You Healthy  Get These Tests  Blood Pressure- Have your blood pressure checked by your healthcare provider at least once a year.  Normal blood pressure is 120/80.  Weight- Have your body mass index (BMI) calculated to screen for obesity.  BMI is a measure of body fat based on height and weight.  You can calculate your own BMI at https://www.west-esparza.com/www.[email or web address]/bmi/  Cholesterol- Have your cholesterol checked every year.  Diabetes- Have your blood sugar checked every year if you have high blood pressure, high cholesterol, a family history of diabetes or if you are overweight.  Pap Test - Have a pap test every 1 to 5 years if you have been sexually active.  If you are older than 65 and recent pap tests have been normal you may not need additional pap tests.  In addition, if you have had a hysterectomy  for benign disease additional pap tests are not necessary.  Mammogram-Yearly mammograms are essential for early detection of breast cancer  Screening for Colon Cancer- Colonoscopy starting at age 79. Screening may begin sooner depending on your family history and other health conditions.  Follow up colonoscopy as directed by your Gastroenterologist.  Screening for Osteoporosis- Screening begins at age 79 with bone density scanning, sooner if you are at higher risk for developing Osteoporosis.  Get these medicines  Calcium with Vitamin D- Your body requires 1200-1500 mg of Calcium a day and [phone] IU of Vitamin D a day.  You can only absorb 500 mg of Calcium at a time therefore Calcium must be taken in 2 or 3 separate doses throughout the day.  Hormones- Hormone  therapy has been associated with increased risk for certain cancers and heart disease.  Talk to your healthcare provider about if you need relief from menopausal symptoms.  Aspirin- Ask your healthcare provider about taking Aspirin to prevent Heart Disease and Stroke.  Get these Immuniztions  Flu shot- Every fall  Pneumonia shot- Once after the age of 79; if you are younger ask your healthcare provider if you need a pneumonia shot.  Tetanus- Every ten years.  Zostavax- Once after the age of 79 to prevent shingles.  Take these steps  Don't smoke- Your healthcare provider can help you quit. For tips on how to quit, ask your healthcare provider or go to www.smokefree.gov or call 1-800 QUIT-NOW.  Be physically active- Exercise 5 days a week for a minimum of 30 minutes.  If you are not already physically active, start slow and gradually work up to 30 minutes of moderate physical activity.  Try walking, dancing, bike riding, swimming, etc.  Eat a healthy diet- Eat a variety of healthy foods such as fruits, vegetables, whole grains, low fat milk, low fat cheeses, yogurt, lean meats, chicken, fish, eggs, dried beans, tofu, etc.  For more information go to www.thenutritionsource.org  Dental visit- Brush and floss teeth twice daily; visit your dentist twice a year.  Eye exam- Visit your Optometrist or Ophthalmologist yearly.  Drink alcohol in moderation- Limit alcohol intake to one drink or less a day.  Never drink and drive.  Depression- Your emotional health is as important as your physical health.  If you're feeling down or losing interest in things you normally enjoy, please talk to your healthcare provider.  Seat Belts- can save your life; always wear one  Smoke/Carbon Monoxide detectors- These detectors need to be installed on the appropriate level of your home.  Replace batteries at least once a year.  Violence- If anyone is threatening or hurting you, please tell your healthcare  provider.  Living Will/ Health care power of attorney- Discuss with your healthcare provider and family.

## 2018-01-19 NOTE — Progress Notes (Addendum)
Subjective:   Laurie Mays is a 77 y.o. female who presents for an Initial Medicare Annual Wellness Visit.       Objective:    Today's Vitals   01/19/18 0928  BP: (!) 152/62  Pulse: (!) 57  Temp: 97.9 F (36.6 C)  TempSrc: Oral  SpO2: 98%  Weight: 230 lb (104.3 kg)  Height: 5\' 4"  (1.626 m)  Patient has not taken her BP meds this morning  Body mass index is 39.48 kg/m.  Advanced Directives 01/19/2018 01/19/2018 12/29/2017 12/25/2017 11/01/2017  Does Patient Have a Medical Advance Directive? Yes Yes No No Yes  Type of Advance Directive Healthcare Power of [employer] Power of Attorney - - Living will  Does patient want to make changes to medical advance directive? No - Patient declined No - Patient declined - - -  Copy of Healthcare Power of Attorney in Chart? Yes Yes - - -    Current Medications (verified) Outpatient Encounter Medications as of 01/19/2018  Medication Sig  . atorvastatin (LIPITOR) 40 MG tablet Take 1 tablet (40 mg total) by mouth daily.  . benzonatate (TESSALON) 100 MG capsule Take 1 capsule (100 mg total) by mouth 3 (three) times daily as needed for cough.  . citalopram (CELEXA) 20 MG tablet Take 20 mg by mouth daily.  . diclofenac sodium (VOLTAREN) 1 % GEL Apply 4 g topically 4 (four) times daily. To both knees. May apply 2 gm to hands QID for arthritis  . diphenhydrAMINE (BENADRYL) 25 MG tablet Take 25 mg by mouth as needed.  . donepezil (ARICEPT) 10 MG tablet Take 10 mg by mouth at bedtime.  . furosemide (LASIX) 20 MG tablet Take 20 mg by mouth daily.  . metoprolol tartrate (LOPRESSOR) 25 MG tablet Take 25 mg by mouth 2 (two) times daily.  . Multiple Vitamins-Minerals (CENTRUM PO) Take by mouth daily.  . predniSONE (STERAPRED UNI-PAK 21 TAB) 10 MG (21) TBPK tablet Use as directed  . dextromethorphan-guaiFENesin (MUCINEX DM) 30-600 MG 12hr tablet Take 1 tablet by mouth 2 (two) times daily.   No facility-administered encounter medications on file  as of 01/19/2018.     Allergies (verified) Lisinopril; Losartan; Memantine; Peanut-containing drug products; and Tramadol   History: Past Medical History:  Diagnosis Date  . Gallbladder disorder 2009   Per New Patient Packet   . High blood pressure    Per New Patient Packet    Past Surgical History:  Procedure Laterality Date  . ABDOMINAL HYSTERECTOMY     Per New Patient Packet   . CARDIAC SURGERY  2011   Open Heart Surgey, Per New Patient Packet    . REPLACEMENT TOTAL KNEE Left 2010   Per New Patient Packet    Family History  Problem Relation Age of Onset  . Dementia Mother   . Arthritis Mother   . Heart attack Father   . Arthritis Father   . Heart attack Sister   . Arthritis Sister    Social History   Socioeconomic History  . Marital status: Married    Spouse name: Not on file  . Number of children: Not on file  . Years of education: Not on file  . Highest education level: Not on file  Occupational History  . Not on file  Social Needs  . Financial resource strain: Not hard at all  . Food insecurity:    Worry: Never true    Inability: Never true  . Transportation needs:    Medical:  No    Non-medical: No  Tobacco Use  . Smoking status: Former Smoker    Last attempt to quit: 07/12/1987    Years since quitting: 30.5  . Smokeless tobacco: Never Used  . Tobacco comment: Quit about 30 years ago as of 2019   Substance and Sexual Activity  . Alcohol use: Never    Frequency: Never  . Drug use: Not Currently  . Sexual activity: Not on file  Lifestyle  . Physical activity:    Days per week: 0 days    Minutes per session: 0 min  . Stress: Not at all  Relationships  . Social connections:    Talks on phone: More than three times a week    Gets together: More than three times a week    Attends religious service: More than 4 times per year    Active member of club or organization: No    Attends meetings of clubs or organizations: Never    Relationship status:  Married  Other Topics Concern  . Not on file  Social History Narrative   Diet: N/A      Caffeine: Coffee and Pepsi      Married, if yes what year: Yes, 1961      Do you live in a house, apartment, assisted living, condo, trailer, ect: House, one stories, 1 person       Pets: None      Current/Past profession: Geologist, engineering       Exercise: No          Living Will: Yes   DNR:   POA/HPOA:      Functional Status:   Do you have difficulty bathing or dressing yourself? No   Do you have difficulty preparing food or eating? Yes   Do you have difficulty managing your medications? Yes    Do you have difficulty managing your finances? Yes    Do you have difficulty affording your medications? No    Tobacco Counseling Counseling given: Not Answered Comment: Quit about 30 years ago as of 2019    Clinical Intake:  Pre-visit preparation completed: No  Pain : No/denies pain     Nutritional Risks: None Diabetes: No  How often do you need to have someone help you when you read instructions, pamphlets, or other written materials from your doctor or pharmacy?: 3 - Sometimes What is the last grade level you completed in school?: College  Interpreter Needed?: No  Information entered by :: [name], RN   Activities of Daily Living In your present state of health, do you have any difficulty performing the following activities: 01/19/2018  Hearing? Y  Vision? N  Difficulty concentrating or making decisions? Y  Walking or climbing stairs? Y  Dressing or bathing? N  Doing errands, shopping? Y  Preparing Food and eating ? Y  Using the Toilet? N  In the past six months, have you accidently leaked urine? Y  Do you have problems with loss of bowel control? N  Managing your Medications? Y  Managing your Finances? Y  Housekeeping or managing your Housekeeping? Y  Some recent data might be hidden     Immunizations and Health Maintenance Immunization History    Administered Date(s) Administered  . Influenza, High Dose Seasonal PF 07/11/1968, 05/25/2017  . Pneumococcal Conjugate-13 01/19/2018  . Tdap 07/11/1988   Health Maintenance Due  Topic Date Due  . TETANUS/TDAP  07/11/1998  . DEXA SCAN  07/28/2003    Patient Care Team:  Kirt Boysarter, Monica, DO as PCP - General (Internal Medicine)  Indicate any recent Medical Services you may have received from other than [facility] providers in the past year (date may be approximate).     Assessment:   This is a routine wellness examination for Scarlette CalicoFrances.  Hearing/Vision screen  Hearing Screening   125Hz  250Hz  500Hz  1000Hz  2000Hz  3000Hz  4000Hz  6000Hz  8000Hz   Right ear:  0 0 0 40  40    Left ear:  0 0 0 40  40    Vision Screening Comments: Sees eye doctor annually  Dietary issues and exercise activities discussed: Current Exercise Habits: The patient does not participate in regular exercise at present, Exercise limited by: neurologic condition(s)  Goals    None     Depression Screen PHQ 2/9 Scores 01/19/2018 01/19/2018  PHQ - 2 Score 0 0    Fall Risk Fall Risk  01/19/2018 01/19/2018 12/29/2017 12/25/2017  Falls in the past year? No No No No    Is the patient's home free of loose throw rugs in walkways, pet beds, electrical cords, etc?   yes      Grab bars in the bathroom? no      Handrails on the stairs?   yes      Adequate lighting?   yes  Cognitive Function: MMSE - Mini Mental State Exam 01/19/2018  Orientation to time 1  Orientation to Place 3  Registration 3  Attention/ Calculation 5  Recall 0  Language- name 2 objects 2  Language- repeat 1  Language- follow 3 step command 3  Language- read & follow direction 1  Write a sentence 1  Copy design 0  Total score 20        Screening Tests Health Maintenance  Topic Date Due  . TETANUS/TDAP  07/11/1998  . DEXA SCAN  07/28/2003  . INFLUENZA VACCINE  02/08/2018  . PNA vac Low Risk Adult (2 of 2 - PPSV23) 01/20/2019    Qualifies for  Shingles Vaccine? Yes, educated and declined for now  Cancer Screenings: Lung: Low Dose CT Chest recommended if Age 42-80 years, 30 pack-year currently smoking OR have quit w/in 15years. Patient does not qualify. Breast: Up to date on Mammogram? No, ordered Up to date of Bone Density/Dexa? No, ordered Colorectal: up to date  Additional Screenings:  Hepatitis C Screening: declined TDAP UJW:[id number]:declined    Plan:    I have personally reviewed and addressed the Medicare Annual Wellness questionnaire and have noted the following in the patient's chart:  A. Medical and social history B. Use of alcohol, tobacco or illicit drugs  C. Current medications and supplements D. Functional ability and status E.  Nutritional status F.  Physical activity G. Advance directives H. List of other physicians I.  Hospitalizations, surgeries, and ER visits in previous 12 months J.  Vitals K. Screenings to include hearing, vision, cognitive, depression L. Referrals and appointments - none  In addition, I have reviewed and discussed with patient certain preventive protocols, quality metrics, and best practice recommendations. A written personalized care plan for preventive services as well as general preventive health recommendations were provided to patient.  See attached scanned questionnaire for additional information.   Signed,   Tyron RussellSara Olivia Pavelko, RN Nurse Health Advisor  Patient Concerns: L foot edema. Would like to start Claritin or zyrtec for allergies

## 2018-01-19 NOTE — Patient Instructions (Addendum)
[name] , Thank you for taking time to come for your Medicare Wellness Visit. I appreciate your ongoing commitment to your health goals. Please review the following plan we discussed and let me know if I can assist you in the future.   Screening recommendations/referrals: Colonoscopy excluded, over age 79 Mammogram due, ordered Bone Density due, ordered Recommended yearly ophthalmology/optometry visit for glaucoma screening and checkup Recommended yearly dental visit for hygiene and checkup  Vaccinations: Influenza vaccine up to date, due 2019 fall season Pneumococcal vaccine 13 due, given today Tdap vaccine due, declined Shingles vaccine due, declined    Advanced directives: in chart  Conditions/risks identified: none  Next appointment: Laurie RussellSara Chemeka Filice, RN 01/21/2019 @ 9:15am   Preventive Care 65 Years and Older, Female Preventive care refers to lifestyle choices and visits with your health care provider that can promote health and wellness. What does preventive care include?  A yearly physical exam. This is also called an annual well check.  Dental exams once or twice a year.  Routine eye exams. Ask your health care provider how often you should have your eyes checked.  Personal lifestyle choices, including:  Daily care of your teeth and gums.  Regular physical activity.  Eating a healthy diet.  Avoiding tobacco and drug use.  Limiting alcohol use.  Practicing safe sex.  Taking low-dose aspirin every day.  Taking vitamin and mineral supplements as recommended by your health care provider. What happens during an annual well check? The services and screenings done by your health care provider during your annual well check will depend on your age, overall health, lifestyle risk factors, and family history of disease. Counseling  Your health care provider may ask you questions about your:  Alcohol use.  Tobacco use.  Drug use.  Emotional well-being.  Home  and relationship well-being.  Sexual activity.  Eating habits.  History of falls.  Memory and ability to understand (cognition).  Work and work Astronomerenvironment.  Reproductive health. Screening  You may have the following tests or measurements:  Height, weight, and BMI.  Blood pressure.  Lipid and cholesterol levels. These may be checked every 5 years, or more frequently if you are over 79 years old.  Skin check.  Lung cancer screening. You may have this screening every year starting at age 79 if you have a 30-pack-year history of smoking and currently smoke or have quit within the past 15 years.  Fecal occult blood test (FOBT) of the stool. You may have this test every year starting at age 79.  Flexible sigmoidoscopy or colonoscopy. You may have a sigmoidoscopy every 5 years or a colonoscopy every 10 years starting at age 79.  Hepatitis C blood test.  Hepatitis B blood test.  Sexually transmitted disease (STD) testing.  Diabetes screening. This is done by checking your blood sugar (glucose) after you have not eaten for a while (fasting). You may have this done every 1-3 years.  Bone density scan. This is done to screen for osteoporosis. You may have this done starting at age 79.  Mammogram. This may be done every 1-2 years. Talk to your health care provider about how often you should have regular mammograms. Talk with your health care provider about your test results, treatment options, and if necessary, the need for more tests. Vaccines  Your health care provider may recommend certain vaccines, such as:  Influenza vaccine. This is recommended every year.  Tetanus, diphtheria, and acellular pertussis (Tdap, Td) vaccine. You may need a  Td booster every 10 years.  Zoster vaccine. You may need this after age 29.  Pneumococcal 13-valent conjugate (PCV13) vaccine. One dose is recommended after age 85.  Pneumococcal polysaccharide (PPSV23) vaccine. One dose is recommended  after age 10. Talk to your health care provider about which screenings and vaccines you need and how often you need them. This information is not intended to replace advice given to you by your health care provider. Make sure you discuss any questions you have with your health care provider. Document Released: 07/24/2015 Document Revised: 03/16/2016 Document Reviewed: 04/28/2015 Elsevier Interactive Patient Education  2017 [place] Prevention in the Home Falls can cause injuries. They can happen to people of all ages. There are many things you can do to make your home safe and to help prevent falls. What can I do on the outside of my home?  Regularly fix the edges of walkways and driveways and fix any cracks.  Remove anything that might make you trip as you walk through a door, such as a raised step or threshold.  Trim any bushes or trees on the path to your home.  Use bright outdoor lighting.  Clear any walking paths of anything that might make someone trip, such as rocks or tools.  Regularly check to see if handrails are loose or broken. Make sure that both sides of any steps have handrails.  Any raised decks and porches should have guardrails on the edges.  Have any leaves, snow, or ice cleared regularly.  Use sand or salt on walking paths during winter.  Clean up any spills in your garage right away. This includes oil or grease spills. What can I do in the bathroom?  Use night lights.  Install grab bars by the toilet and in the tub and shower. Do not use towel bars as grab bars.  Use non-skid mats or decals in the tub or shower.  If you need to sit down in the shower, use a plastic, non-slip stool.  Keep the floor dry. Clean up any water that spills on the floor as soon as it happens.  Remove soap buildup in the tub or shower regularly.  Attach bath mats securely with double-sided non-slip rug tape.  Do not have throw rugs and other things on the floor  that can make you trip. What can I do in the bedroom?  Use night lights.  Make sure that you have a light by your bed that is easy to reach.  Do not use any sheets or blankets that are too big for your bed. They should not hang down onto the floor.  Have a firm chair that has side arms. You can use this for support while you get dressed.  Do not have throw rugs and other things on the floor that can make you trip. What can I do in the kitchen?  Clean up any spills right away.  Avoid walking on wet floors.  Keep items that you use a lot in easy-to-reach places.  If you need to reach something above you, use a strong step stool that has a grab bar.  Keep electrical cords out of the way.  Do not use floor polish or wax that makes floors slippery. If you must use wax, use non-skid floor wax.  Do not have throw rugs and other things on the floor that can make you trip. What can I do with my stairs?  Do not leave any items on the stairs.  Make sure that there are handrails on both sides of the stairs and use them. Fix handrails that are broken or loose. Make sure that handrails are as long as the stairways.  Check any carpeting to make sure that it is firmly attached to the stairs. Fix any carpet that is loose or worn.  Avoid having throw rugs at the top or bottom of the stairs. If you do have throw rugs, attach them to the floor with carpet tape.  Make sure that you have a light switch at the top of the stairs and the bottom of the stairs. If you do not have them, ask someone to add them for you. What else can I do to help prevent falls?  Wear shoes that:  Do not have high heels.  Have rubber bottoms.  Are comfortable and fit you well.  Are closed at the toe. Do not wear sandals.  If you use a stepladder:  Make sure that it is fully opened. Do not climb a closed stepladder.  Make sure that both sides of the stepladder are locked into place.  Ask someone to hold it  for you, if possible.  Clearly mark and make sure that you can see:  Any grab bars or handrails.  First and last steps.  Where the edge of each step is.  Use tools that help you move around (mobility aids) if they are needed. These include:  Canes.  Walkers.  Scooters.  Crutches.  Turn on the lights when you go into a dark area. Replace any light bulbs as soon as they burn out.  Set up your furniture so you have a clear path. Avoid moving your furniture around.  If any of your floors are uneven, fix them.  If there are any pets around you, be aware of where they are.  Review your medicines with your doctor. Some medicines can make you feel dizzy. This can increase your chance of falling. Ask your doctor what other things that you can do to help prevent falls. This information is not intended to replace advice given to you by your health care provider. Make sure you discuss any questions you have with your health care provider. Document Released: 04/23/2009 Document Revised: 12/03/2015 Document Reviewed: 08/01/2014 Elsevier Interactive Patient Education  2017 [employer].

## 2018-01-19 NOTE — Progress Notes (Signed)
Patient ID: Laurie Mays, female   DOB: 1940-11-27, 77 y.o.   MRN: [id number]   Location:  [facility]  Place of Service:  OFFICE  Provider: Elmon Kirschner, DO  Patient Care Team: Kirt Boys, DO as PCP - General (Internal Medicine)  Extended Emergency Contact Information Primary Emergency Contact: Elijio Miles Address: [address]          [place], [place] [address] [place] Home Phone: [phone] Relation: Spouse Secondary Emergency Contact: Earlean Polka Home Phone: [phone] Work Phone: [phone] Mobile Phone: [phone] Relation: Daughter  Code Status:  Goals of Care: Advanced Directive information Advanced Directives 01/19/2018  Does Patient Have a Medical Advance Directive? Yes  Type of Advance Directive Healthcare Power of Attorney  Does patient want to make changes to medical advance directive? No - Patient declined  Copy of Healthcare Power of Attorney in Chart? Yes     Chief Complaint  Patient presents with  . Medical Management of Chronic Issues    Pt is being seen for a complete physical.   . ACP    Pt has HCPOA  . Depression    Score of 0    HPI: Patient is a 77 y.o. female seen in today for an annual wellness exam.  She completed AWV today. MMSE 20/30 today. She c/o seasonal allergy sx's with post nasal drip and rhinorrhea. Takes prn claritin. She has prn left ankle swelling. She is a poor historian due to dementia. Hx obtained from chart.  HTN/ CAD with hx MI - stable on metoprolol and lasix. She had cardiac sx some time ago but has not seen cardio in several yrs.  Hyperlipidemia - stable on lipitor  Dementia with anxiousness - mood stable on citalopram. She takes aricept for dementia. No diarrhea or nightmares. She was intolerant of namenda (increased behaviors)  Depression screen [facility] 2/9 01/19/2018 01/19/2018  Decreased Interest 0 0  Down, Depressed, Hopeless 0 0  PHQ - 2 Score 0 0    Fall Risk  01/19/2018 01/19/2018 12/29/2017  12/25/2017  Falls in the past year? No No No No   MMSE - Mini Mental State Exam 01/19/2018  Orientation to time 1  Orientation to Place 3  Registration 3  Attention/ Calculation 5  Recall 0  Language- name 2 objects 2  Language- repeat 1  Language- follow 3 step command 3  Language- read & follow direction 1  Write a sentence 1  Copy design 0  Total score 20     Health Maintenance  Topic Date Due  . TETANUS/TDAP  07/11/1998  . DEXA SCAN  07/28/2003  . PNA vac Low Risk Adult (1 of 2 - PCV13) 07/28/2003  . INFLUENZA VACCINE  02/08/2018    Past Medical History:  Diagnosis Date  . Gallbladder disorder 2009   Per New Patient Packet   . High blood pressure    Per New Patient Packet     Past Surgical History:  Procedure Laterality Date  . ABDOMINAL HYSTERECTOMY     Per New Patient Packet   . CARDIAC SURGERY  2011   Open Heart Surgey, Per New Patient Packet    . REPLACEMENT TOTAL KNEE Left 2010   Per New Patient Packet     Family History  Problem Relation Age of Onset  . Dementia Mother   . Arthritis Mother   . Heart attack Father   . Arthritis Father   . Heart attack Sister   . Arthritis Sister  Family Status  Relation Name Status  . Mother Corrie Dandy Deceased  . Father John Deceased  . Sister Edwena Felty Deceased  . Daughter [employer]  . Daughter [employer]  . Son Maylene Roes    Social History   Socioeconomic History  . Marital status: Married    Spouse name: Not on file  . Number of children: Not on file  . Years of education: Not on file  . Highest education level: Not on file  Occupational History  . Not on file  Social Needs  . Financial resource strain: Not hard at all  . Food insecurity:    Worry: Never true    Inability: Never true  . Transportation needs:    Medical: No    Non-medical: No  Tobacco Use  . Smoking status: Former Smoker    Last attempt to quit: 07/12/1987    Years since quitting: 30.5  . Smokeless tobacco: Never Used  .  Tobacco comment: Quit about 30 years ago as of 2019   Substance and Sexual Activity  . Alcohol use: Never    Frequency: Never  . Drug use: Not Currently  . Sexual activity: Not on file  Lifestyle  . Physical activity:    Days per week: 0 days    Minutes per session: 0 min  . Stress: Not at all  Relationships  . Social connections:    Talks on phone: More than three times a week    Gets together: More than three times a week    Attends religious service: More than 4 times per year    Active member of club or organization: No    Attends meetings of clubs or organizations: Never    Relationship status: Married  . Intimate partner violence:    Fear of current or ex partner: No    Emotionally abused: No    Physically abused: No    Forced sexual activity: No  Other Topics Concern  . Not on file  Social History Narrative   Diet: N/A      Caffeine: Coffee and Pepsi      Married, if yes what year: Yes, 1961      Do you live in a house, apartment, assisted living, condo, trailer, ect: House, one stories, 1 person       Pets: None      Current/Past profession: Geologist, engineering       Exercise: No          Living Will: Yes   DNR:   POA/HPOA:      Functional Status:   Do you have difficulty bathing or dressing yourself? No   Do you have difficulty preparing food or eating? Yes   Do you have difficulty managing your medications? Yes    Do you have difficulty managing your finances? Yes    Do you have difficulty affording your medications? No    Allergies  Allergen Reactions  . Lisinopril Other (See Comments)  . Losartan Other (See Comments)  . Memantine Other (See Comments)    combative  . Peanut-Containing Drug Products   . Tramadol Hives    Allergies as of 01/19/2018      Reactions   Lisinopril Other (See Comments)   Losartan Other (See Comments)   Memantine Other (See Comments)   combative   Peanut-containing Drug Products    Tramadol Hives        Medication List        Accurate as of 01/19/18  9:47 AM. Always use your most recent med list.          atorvastatin 40 MG tablet Commonly known as:  LIPITOR Take 1 tablet (40 mg total) by mouth daily.   benzonatate 100 MG capsule Commonly known as:  TESSALON Take 1 capsule (100 mg total) by mouth 3 (three) times daily as needed for cough.   CENTRUM PO Take by mouth daily.   citalopram 20 MG tablet Commonly known as:  CELEXA Take 20 mg by mouth daily.   dextromethorphan-guaiFENesin 30-600 MG 12hr tablet Commonly known as:  MUCINEX DM Take 1 tablet by mouth 2 (two) times daily.   diclofenac sodium 1 % Gel Commonly known as:  VOLTAREN Apply 4 g topically 4 (four) times daily. To both knees. May apply 2 gm to hands QID for arthritis   diphenhydrAMINE 25 MG tablet Commonly known as:  BENADRYL Take 25 mg by mouth as needed.   donepezil 10 MG tablet Commonly known as:  ARICEPT Take 10 mg by mouth at bedtime.   furosemide 20 MG tablet Commonly known as:  LASIX Take 20 mg by mouth daily.   metoprolol tartrate 25 MG tablet Commonly known as:  LOPRESSOR Take 25 mg by mouth 2 (two) times daily.   predniSONE 10 MG (21) Tbpk tablet Commonly known as:  STERAPRED UNI-PAK 21 TAB Use as directed        Review of Systems:  Review of Systems  Unable to perform ROS: Dementia    Physical Exam: Vitals:   01/19/18 0944  BP: (!) 152/62  Pulse: (!) 57  Temp: 97.9 F (36.6 C)  TempSrc: Oral  SpO2: 98%  Weight: 230 lb (104.3 kg)  Height: 5\' 4"  (1.626 m)   Body mass index is 39.48 kg/m. Physical Exam  Constitutional: She appears well-developed and well-nourished. No distress.  HENT:  Head: Normocephalic and atraumatic.  Right Ear: Hearing, tympanic membrane, external ear and ear canal normal.  Left Ear: Hearing, tympanic membrane, external ear and ear canal normal.  Mouth/Throat: Uvula is midline, oropharynx is clear and moist and mucous membranes are normal. She  does not have dentures.  Eyes: Pupils are equal, round, and reactive to light. Conjunctivae, EOM and lids are normal. No scleral icterus.  Neck: Trachea normal and normal range of motion. Neck supple. Carotid bruit is not present. No thyroid mass and no thyromegaly present.  Cardiovascular: Normal rate, regular rhythm and intact distal pulses. Exam reveals no gallop and no friction rub.  Murmur (1/6 SEM) heard. +1 pitting LE edema b/l; no calf TTP; varicose veins b/l LE soft and NT  Pulmonary/Chest: Effort normal and breath sounds normal. No stridor. No respiratory distress. She has no wheezes. She has no rhonchi. She has no rales. She exhibits no mass and no tenderness. Right breast exhibits no inverted nipple, no mass, no nipple discharge, no skin change and no tenderness. Left breast exhibits no inverted nipple, no mass, no nipple discharge, no skin change and no tenderness. Breasts are symmetrical.  Midline sternal incisional scar  Abdominal: Soft. Normal appearance, normal aorta and bowel sounds are normal. She exhibits no pulsatile midline mass and no mass. There is no hepatosplenomegaly. There is no tenderness. There is no rigidity, no rebound and no guarding. No hernia.  obese  Musculoskeletal: Normal range of motion. She exhibits edema (small, intermediate and large joints). She exhibits no deformity.  Lymphadenopathy:       Head (right side): No posterior auricular adenopathy present.  Head (left side): No posterior auricular adenopathy present.    She has no cervical adenopathy.       Right: No supraclavicular adenopathy present.       Left: No supraclavicular adenopathy present.  Neurological: She is alert. She has normal strength and normal reflexes. No cranial nerve deficit. Gait normal.  Skin: Skin is warm, dry and intact. No rash noted. Nails show no clubbing.     Psychiatric: She has a normal mood and affect. Her speech is normal and behavior is normal. Thought content  normal. Cognition and memory are normal.    Labs reviewed:  Basic Metabolic Panel: Recent Labs    11/01/17 0948  NA 139  K 3.7  CL 107  CO2 28  GLUCOSE 93  BUN 11  CREATININE 0.97*  CALCIUM 9.0  TSH 1.11   Liver Function Tests: Recent Labs    11/01/17 0948  AST 14  ALT 10  BILITOT 0.6  PROT 6.7   No results for input(s): LIPASE, AMYLASE in the last 8760 hours. No results for input(s): AMMONIA in the last 8760 hours. CBC: Recent Labs    11/01/17 0948  WBC 4.2  NEUTROABS 2,008  HGB 12.7  HCT 38.4  MCV 83.7  PLT 182   Lipid Panel: Recent Labs    11/01/17 0948  CHOL 161  HDL 59  LDLCALC 88  TRIG 62  CHOLHDL 2.7   No results found for: HGBA1C  Procedures: No results found. ECG OBTAINED AND REVIEWED BY MYSELF: 1st degree AVB @ 48 bpm, nml axis, LAE, NSST changes, poor R wave progression. No acute ischemic changes. No other ECG available to compare.   Assessment/Plan   ICD-10-CM   1. Well adult exam Z00.00   2. Bradycardia by electrocardiogram R00.1   3. Allergic rhinitis, unspecified seasonality, unspecified trigger J30.9   4. Essential hypertension I10 EKG 12-Lead  5. Swelling of joint of multiple sites M25.40   6. Epidermal cyst L72.0    mid thoracic; grape sized  7. Coronary artery disease involving native coronary artery of native heart without angina pectoris I25.10   8. Mixed hyperlipidemia E78.2   9. Bilateral leg edema R60.0   10. Dementia without behavioral disturbance, unspecified dementia type F03.90     Continue current medications as ordered  RECOMMEND OTC PLAIN CLARITIN OR ZYRTEC DAILY FOR SEASONAL ALLERGY   Will call with cardiology referral  cologuard as ordered for colon cancer screening  Follow up for bone density scan and mammogram  Follow up in 4 mos for HTN, CAD, dementia, hyperlipidemia. Fasting labs prior to appt  Keeping You Healthy handout given   Bertis RuddyMonica S. Ancil Linseyarter, D. O., F. A. C. O. I.  [facility] [address] [place], [place] [address] [phone] Cell (Monday-Friday 8 AM - 5 PM) [phone] After 5 PM and follow prompts

## 2018-02-08 ENCOUNTER — Encounter: Payer: Self-pay | Admitting: Internal Medicine

## 2018-02-28 ENCOUNTER — Encounter: Payer: Self-pay | Admitting: Internal Medicine

## 2018-03-07 ENCOUNTER — Other Ambulatory Visit: Payer: Self-pay

## 2018-03-07 DIAGNOSIS — Z79899 Other long term (current) drug therapy: Secondary | ICD-10-CM

## 2018-03-07 DIAGNOSIS — E782 Mixed hyperlipidemia: Secondary | ICD-10-CM

## 2018-03-07 DIAGNOSIS — I10 Essential (primary) hypertension: Secondary | ICD-10-CM

## 2018-03-24 ENCOUNTER — Encounter: Payer: Self-pay | Admitting: Internal Medicine

## 2018-03-24 LAB — COLOGUARD

## 2018-03-26 ENCOUNTER — Other Ambulatory Visit: Payer: Self-pay | Admitting: Internal Medicine

## 2018-03-29 ENCOUNTER — Encounter: Payer: Self-pay | Admitting: Cardiology

## 2018-03-29 ENCOUNTER — Ambulatory Visit: Payer: Medicare Other | Admitting: Cardiology

## 2018-03-29 VITALS — BP 140/68 | HR 43 | Ht 64.0 in | Wt 231.0 lb

## 2018-03-29 DIAGNOSIS — I519 Heart disease, unspecified: Secondary | ICD-10-CM | POA: Insufficient documentation

## 2018-03-29 DIAGNOSIS — Z951 Presence of aortocoronary bypass graft: Secondary | ICD-10-CM | POA: Diagnosis not present

## 2018-03-29 DIAGNOSIS — I2510 Atherosclerotic heart disease of native coronary artery without angina pectoris: Secondary | ICD-10-CM | POA: Diagnosis not present

## 2018-03-29 DIAGNOSIS — E7800 Pure hypercholesterolemia, unspecified: Secondary | ICD-10-CM | POA: Insufficient documentation

## 2018-03-29 DIAGNOSIS — R001 Bradycardia, unspecified: Secondary | ICD-10-CM

## 2018-03-29 NOTE — Progress Notes (Signed)
Cardiology Office Note:    Date:  03/29/2018   ID:  Laurie HarderFrances P Mays, DOB 1940-10-08, MRN [id number]  PCP:  Kirt Boysarter, Monica, DO  Cardiologist:  Jodelle RedBridgette Ashana Tullo, MD PhD  Referring MD: Kirt Boysarter, Monica, DO   CC: fatigue  History of Present Illness:    Laurie Mays is a 77 y.o. female with a hx of hypertension, CAD s/p prior 3V CABG in 2011 or 2012 who is seen as a new consult at the request of Kirt BoysCarter, Monica, DO for the evaluation and management of bradycardia.  Patient is here with her daughter today. Very pleasant. Her only concerns is that she feels very sleepy/tired. No chest pain, shortness of breath, PND, orthopnea, syncope. Rare LE edema.   [place] Reported as 3V CABG. Never told her heart was weak. No recent cath or echoes.  She was noted to have sinus bradycardia on a recent ECG, and she has it again today. She states that she can be as active as she needs to be, but she does feel fatigued.  Past Medical History:  Diagnosis Date  . Gallbladder disorder 2009   Per New Patient Packet   . High blood pressure    Per New Patient Packet     Past Surgical History:  Procedure Laterality Date  . ABDOMINAL HYSTERECTOMY     Per New Patient Packet   . CARDIAC SURGERY  2011   Open Heart Surgey, Per New Patient Packet    . REPLACEMENT TOTAL KNEE Left 2010   Per New Patient Packet     Current Medications: Current Outpatient Medications on File Prior to Visit  Medication Sig  . atorvastatin (LIPITOR) 40 MG tablet Take 1 tablet (40 mg total) by mouth daily.  . benzonatate (TESSALON) 100 MG capsule Take 1 capsule (100 mg total) by mouth 3 (three) times daily as needed for cough.  . citalopram (CELEXA) 20 MG tablet Take 20 mg by mouth daily.  Marland Kitchen. dextromethorphan-guaiFENesin (MUCINEX DM) 30-600 MG 12hr tablet Take 1 tablet by mouth 2 (two) times daily.  . diclofenac sodium (VOLTAREN) 1 % GEL Apply 4 g topically 4 (four) times daily. To  both knees. May apply 2 gm to hands QID for arthritis  . diphenhydrAMINE (BENADRYL) 25 MG tablet Take 25 mg by mouth as needed.  . donepezil (ARICEPT) 10 MG tablet TAKE 1 TABLET BY MOUTH EVERYDAY AT BEDTIME. **NEEDS OV**  . furosemide (LASIX) 20 MG tablet Take 20 mg by mouth daily.  . metoprolol tartrate (LOPRESSOR) 25 MG tablet Take 25 mg by mouth 2 (two) times daily.  . Multiple Vitamins-Minerals (CENTRUM PO) Take by mouth daily.  . predniSONE (STERAPRED UNI-PAK 21 TAB) 10 MG (21) TBPK tablet Use as directed   No current facility-administered medications on file prior to visit.      Allergies:   Lisinopril; Losartan; Memantine; Peanut-containing drug products; and Tramadol   Social History   Socioeconomic History  . Marital status: Married    Spouse name: Not on file  . Number of children: Not on file  . Years of education: Not on file  . Highest education level: Not on file  Occupational History  . Not on file  Social Needs  . Financial resource strain: Not hard at all  . Food insecurity:    Worry: Never true    Inability: Never true  . Transportation needs:    Medical: No    Non-medical: No  Tobacco Use  .  Smoking status: Former Smoker    Last attempt to quit: 07/12/1987    Years since quitting: 30.7  . Smokeless tobacco: Never Used  . Tobacco comment: Quit about 30 years ago as of 2019   Substance and Sexual Activity  . Alcohol use: Never    Frequency: Never  . Drug use: Not Currently  . Sexual activity: Not on file  Lifestyle  . Physical activity:    Days per week: 0 days    Minutes per session: 0 min  . Stress: Not at all  Relationships  . Social connections:    Talks on phone: More than three times a week    Gets together: More than three times a week    Attends religious service: More than 4 times per year    Active member of club or organization: No    Attends meetings of clubs or organizations: Never    Relationship status: Married  Other Topics Concern   . Not on file  Social History Narrative   Diet: N/A      Caffeine: Coffee and Pepsi      Married, if yes what year: Yes, 1961      Do you live in a house, apartment, assisted living, condo, trailer, ect: House, one stories, 1 person       Pets: None      Current/Past profession: Geologist, engineering       Exercise: No          Living Will: Yes   DNR:   POA/HPOA:      Functional Status:   Do you have difficulty bathing or dressing yourself? No   Do you have difficulty preparing food or eating? Yes   Do you have difficulty managing your medications? Yes    Do you have difficulty managing your finances? Yes    Do you have difficulty affording your medications? No     Family History: The patient's family history includes Arthritis in her father, mother, and sister; Dementia in her mother; Heart attack in her father and sister.  ROS:   Please see the history of present illness.  Additional pertinent ROS: Review of Systems  Constitutional: Negative for chills and fever.  HENT: Negative for ear pain and hearing loss.   Eyes: Negative for blurred vision and pain.  Respiratory: Negative for sputum production and shortness of breath.   Cardiovascular: Positive for leg swelling. Negative for chest pain, palpitations, orthopnea, claudication and PND.  Gastrointestinal: Negative for abdominal pain and blood in stool.  Genitourinary: Negative for dysuria and hematuria.  Musculoskeletal: Positive for joint pain. Negative for myalgias.  Skin: Negative for rash.  Neurological: Negative for focal weakness and loss of consciousness.  Endo/Heme/Allergies: Does not bruise/bleed easily.   EKGs/Labs/Other Studies Reviewed:    The following studies were reviewed today: Prior notes, no available prior cardiac studies  EKG:  EKG is ordered today.  The ekg ordered today demonstrates sinus bradycardia at 43 bpm.  Recent Labs: 11/01/2017: ALT 10; BUN 11; Creat 0.97; Hemoglobin 12.7; Platelets  182; Potassium 3.7; Sodium 139; TSH 1.11  Recent Lipid Panel    Component Value Date/Time   CHOL 161 11/01/2017 0948   TRIG 62 11/01/2017 0948   HDL 59 11/01/2017 0948   CHOLHDL 2.7 11/01/2017 0948   LDLCALC 88 11/01/2017 0948    Physical Exam:    VS:  BP 140/68   Pulse (!) 43   Ht 5\' 4"  (1.626 m)   Wt 231 lb (  104.8 kg)   BMI 39.65 kg/m     Wt Readings from Last 3 Encounters:  01/19/18 230 lb (104.3 kg)  01/19/18 230 lb (104.3 kg)  12/29/17 231 lb (104.8 kg)     GEN: Well nourished, well developed in no acute distress HEENT: Normal NECK: No JVD; No carotid bruits LYMPHATICS: No lymphadenopathy CARDIAC: regular rhythm, normal S1 and S2, no murmurs, rubs, gallops. Radial and DP pulses 2+ bilaterally. RESPIRATORY:  Clear to auscultation without rales, wheezing or rhonchi  ABDOMEN: Soft, non-tender, non-distended MUSCULOSKELETAL:  No edema; No deformity  SKIN: Warm and dry NEUROLOGIC:  Pleasant but poor historian, asks same questions in repetition, history assisted by daughter PSYCHIATRIC:  Normal affect   ASSESSMENT:    1. Sinus bradycardia   2. Hx of CABG   3. Coronary artery disease involving native heart without angina pectoris, unspecified vessel or lesion type    PLAN:    1. Sinus bradycardia: No clear syncope or exercise intolerance, but does describe fatigue.  -Will stop metoprolol. Instructed to cut in half, take 12.5 mg BID for four days, then stop.  -has follow up scheduled with primary care in just over a month.  2. CAD s/p 3V CABG in 2011 or 2012: no records, no symptoms.  -if fatigue persists after stopping metoprolol, would consider echocardiogram  Plan for follow up: as needed  Medication Adjustments/Labs and Tests Ordered: Current medicines are reviewed at length with the patient today.  Concerns regarding medicines are outlined above.  No orders of the defined types were placed in this encounter.  No orders of the defined types were placed in  this encounter.   Patient Instructions  Medication Instructions: Your Physician recommend you make the following changes to your medication. Stop: Metoprolol 25 mg daily     If you need a refill on your cardiac medications before your next appointment, please call your pharmacy.   Labwork: None  Procedures/Testing: None  Follow-Up: As needed with Dr. Cristal Deer. Call our office at [phone] to schedule an appointment.   Special Instructions:    Thank you for choosing [facility] at [facility]!!       Signed, [name], MD PhD 03/29/2018 12:41 PM    [facility]

## 2018-03-29 NOTE — Patient Instructions (Signed)
Medication Instructions: Your Physician recommend you make the following changes to your medication. Stop: Metoprolol 25 mg daily     If you need a refill on your cardiac medications before your next appointment, please call your pharmacy.   Labwork: None  Procedures/Testing: None  Follow-Up: As needed with Dr. Cristal Deerhristopher. Call our office at [phone] to schedule an appointment.   Special Instructions:    Thank you for choosing [facility] at [facility]!!

## 2018-04-02 NOTE — Addendum Note (Signed)
Addended by: Armen PickupWYSOR, [place] Mccutchen T on: 04/02/2018 04:55 PM   Modules accepted: Orders

## 2018-04-23 ENCOUNTER — Other Ambulatory Visit: Payer: Self-pay | Admitting: Internal Medicine

## 2018-04-23 NOTE — Telephone Encounter (Signed)
Patient requested to be sent to [facility]

## 2018-04-28 ENCOUNTER — Other Ambulatory Visit: Payer: Self-pay | Admitting: Internal Medicine

## 2018-04-28 DIAGNOSIS — E782 Mixed hyperlipidemia: Secondary | ICD-10-CM

## 2018-05-21 ENCOUNTER — Other Ambulatory Visit: Payer: Medicare Other

## 2018-05-21 DIAGNOSIS — Z79899 Other long term (current) drug therapy: Secondary | ICD-10-CM

## 2018-05-21 DIAGNOSIS — I10 Essential (primary) hypertension: Secondary | ICD-10-CM

## 2018-05-21 DIAGNOSIS — E782 Mixed hyperlipidemia: Secondary | ICD-10-CM

## 2018-05-22 LAB — COMPLETE METABOLIC PANEL WITH GFR
AG RATIO: 1.2 (calc) (ref 1.0–2.5)
ALKALINE PHOSPHATASE (APISO): 77 U/L (ref 33–130)
ALT: 9 U/L (ref 6–29)
AST: 14 U/L (ref 10–35)
Albumin: 3.7 g/dL (ref 3.6–5.1)
BILIRUBIN TOTAL: 0.5 mg/dL (ref 0.2–1.2)
BUN: 10 mg/dL (ref 7–25)
CHLORIDE: 105 mmol/L (ref 98–110)
CO2: 27 mmol/L (ref 20–32)
Calcium: 9.1 mg/dL (ref 8.6–10.4)
Creat: 0.91 mg/dL (ref 0.60–0.93)
GFR, Est African American: 70 mL/min/{1.73_m2} (ref >=60)
GFR, Est Non African American: 60 mL/min/{1.73_m2} (ref >=60)
GLOBULIN: 3 g/dL (ref 1.9–3.7)
Glucose, Bld: 83 mg/dL (ref 65–99)
POTASSIUM: 3.5 mmol/L (ref 3.5–5.3)
SODIUM: 140 mmol/L (ref 135–146)
Total Protein: 6.7 g/dL (ref 6.1–8.1)

## 2018-05-22 LAB — CBC WITH DIFFERENTIAL/PLATELET
BASOS ABS: 30 {cells}/uL (ref 0–200)
Basophils Relative: 0.9 %
Eosinophils Absolute: 142 cells/uL (ref 15–500)
Eosinophils Relative: 4.3 %
HEMATOCRIT: 41.1 % (ref 35.0–45.0)
Hemoglobin: 13.3 g/dL (ref 11.7–15.5)
Lymphs Abs: 1535 cells/uL (ref 850–3900)
MCH: 26.8 pg — ABNORMAL LOW (ref 27.0–33.0)
MCHC: 32.4 g/dL (ref 32.0–36.0)
MCV: 82.9 fL (ref 80.0–100.0)
MPV: 10.9 fL (ref 7.5–12.5)
Monocytes Relative: 9.4 %
NEUTROS PCT: 38.9 %
Neutro Abs: 1284 cells/uL — ABNORMAL LOW (ref 1500–7800)
PLATELETS: 196 10*3/uL (ref 140–400)
RBC: 4.96 10*6/uL (ref 3.80–5.10)
RDW: 13.7 % (ref 11.0–15.0)
TOTAL LYMPHOCYTE: 46.5 %
WBC: 3.3 10*3/uL — AB (ref 3.8–10.8)
WBCMIX: 310 {cells}/uL (ref 200–950)

## 2018-05-22 LAB — LIPID PANEL
Cholesterol: 155 mg/dL (ref <200)
HDL: 54 mg/dL (ref >50)
LDL Cholesterol (Calc): 88 mg/dL (calc)
NON-HDL CHOLESTEROL (CALC): 101 mg/dL (ref <130)
Total CHOL/HDL Ratio: 2.9 (calc) (ref <5.0)
Triglycerides: 52 mg/dL (ref <150)

## 2018-05-23 ENCOUNTER — Ambulatory Visit: Payer: Medicare Other | Admitting: Internal Medicine

## 2018-05-23 ENCOUNTER — Encounter: Payer: Self-pay | Admitting: Nurse Practitioner

## 2018-05-23 ENCOUNTER — Ambulatory Visit (INDEPENDENT_AMBULATORY_CARE_PROVIDER_SITE_OTHER): Payer: Medicare Other | Admitting: Nurse Practitioner

## 2018-05-23 VITALS — BP 132/84 | HR 61 | Temp 97.9°F | Ht 64.0 in | Wt 232.0 lb

## 2018-05-23 DIAGNOSIS — G4700 Insomnia, unspecified: Secondary | ICD-10-CM

## 2018-05-23 DIAGNOSIS — E782 Mixed hyperlipidemia: Secondary | ICD-10-CM

## 2018-05-23 DIAGNOSIS — I10 Essential (primary) hypertension: Secondary | ICD-10-CM

## 2018-05-23 DIAGNOSIS — J309 Allergic rhinitis, unspecified: Secondary | ICD-10-CM

## 2018-05-23 DIAGNOSIS — Z23 Encounter for immunization: Secondary | ICD-10-CM

## 2018-05-23 DIAGNOSIS — F0390 Unspecified dementia without behavioral disturbance: Secondary | ICD-10-CM

## 2018-05-23 DIAGNOSIS — M170 Bilateral primary osteoarthritis of knee: Secondary | ICD-10-CM

## 2018-05-23 DIAGNOSIS — I2510 Atherosclerotic heart disease of native coronary artery without angina pectoris: Secondary | ICD-10-CM

## 2018-05-23 NOTE — Patient Instructions (Addendum)
Can increase tylenol to 1000 mg by mouth every 8 hours  Continue to use Voltaren gel as needed - can also use Aspercreme with lidocaine  If gets worse notify us, we can get xrays and possible knee injection.   To use melatonin 3-6 mg by mouth at bedtime for sleep.  Avoid benadryl for sleep.   To use zyrtec (Cetirizine)10 mg by mouth daily as need for allergies.   Follow up in 6 months lab work before visit

## 2018-05-23 NOTE — Progress Notes (Signed)
Careteam: Patient Care Team: [name], NP as PCP - General (Geriatric Medicine) [name], MD as PCP - Cardiology (Cardiology)  Advanced Directive information Does Patient Have a Medical Advance Directive?: Yes, Type of Advance Directive: Healthcare Power of Attorney  Allergies  Allergen Reactions  . Lisinopril Other (See Comments)  . Losartan Other (See Comments)  . Memantine Other (See Comments)    combative  . Peanut-Containing Drug Products   . Tramadol Hives    Chief Complaint  Patient presents with  . Medical Management of Chronic Issues    Pt is being seen for a 4 month routine visit. Pt has no concerns today.      HPI: Patient is a 77 y.o. female seen in the office today for 4 month follow up. Pt with [facility] on 01/19/18. MMSE 20/30  HTN/ CAD with hx MI - stable on lasix.off metoprolol due to bradycardia No recent chest pains. Recent visit with cardiologist.   Hyperlipidemia - stable on lipitor, LDL 88  Dementia with anxiousness - mood stable on citalopram. She takes aricept for dementia. No diarrhea or nightmares. She was intolerant of namenda (increased behaviors) no acute changes, lives with husband.   Due to mammogram and dexa scan- plans to schedule this.  Flu shot given today  A lot of pain in her knees. Takes tylenol 500 mg but not always effective.   Review of Systems:  Review of Systems  Constitutional: Negative for chills, fever and weight loss.  HENT: Negative for tinnitus.   Respiratory: Negative for cough, sputum production and shortness of breath.   Cardiovascular: Negative for chest pain, palpitations and leg swelling.  Gastrointestinal: Negative for abdominal pain, constipation, diarrhea and heartburn.  Genitourinary: Negative for dysuria, frequency and urgency.  Musculoskeletal: Positive for joint pain. Negative for back pain, falls and myalgias.  Skin: Negative.   Neurological: Negative for dizziness and headaches.    Psychiatric/Behavioral: Positive for memory loss. Negative for depression. The patient does not have insomnia.     Past Medical History:  Diagnosis Date  . Gallbladder disorder 2009   Per New Patient Packet   . Heart disease   . High blood pressure    Per New Patient Packet   . High cholesterol    Past Surgical History:  Procedure Laterality Date  . ABDOMINAL HYSTERECTOMY     Per New Patient Packet   . CARDIAC SURGERY  2011   Open Heart Surgey, Per New Patient Packet    . REPLACEMENT TOTAL KNEE Left 2010   Per New Patient Packet    Social History:   reports that she quit smoking about 30 years ago. She has never used smokeless tobacco. She reports that she has current or past drug history. She reports that she does not drink alcohol.  Family History  Problem Relation Age of Onset  . Dementia Mother   . Arthritis Mother   . Heart attack Father   . Arthritis Father   . Heart attack Sister   . Arthritis Sister     Medications: Patient's Medications  New Prescriptions   No medications on file  Previous Medications   ATORVASTATIN (LIPITOR) 40 MG TABLET    TAKE 1 TABLET BY MOUTH EVERY DAY   CITALOPRAM (CELEXA) 20 MG TABLET    TAKE 1 TABLET BY MOUTH EVERY DAY   DICLOFENAC SODIUM (VOLTAREN) 1 % GEL    Apply 4 g topically 4 (four) times daily. To both knees. May apply 2 gm  to hands QID for arthritis   DIPHENHYDRAMINE (BENADRYL) 25 MG TABLET    Take 25 mg by mouth as needed.   DONEPEZIL (ARICEPT) 10 MG TABLET    Take 1 tablet by mouth at bedtime.   FUROSEMIDE (LASIX) 20 MG TABLET    Take 20 mg by mouth daily.   MULTIPLE VITAMINS-MINERALS (CENTRUM PO)    Take by mouth daily.  Modified Medications   No medications on file  Discontinued Medications   No medications on file     Physical Exam:  Vitals:   05/23/18 1449  BP: 132/84  Pulse: 61  Temp: 97.9 F (36.6 C)  TempSrc: Oral  SpO2: 96%  Weight: 232 lb (105.2 kg)  Height: 5\' 4"  (1.626 m)   Body mass index is  39.82 kg/m.  Physical Exam  Constitutional: She is oriented to person, place, and time. She appears well-developed and well-nourished.  HENT:  Mouth/Throat: Oropharynx is clear and moist. No oropharyngeal exudate.  Eyes: Pupils are equal, round, and reactive to light. No scleral icterus.  Neck: Neck supple. Carotid bruit is not present. No tracheal deviation present. No thyromegaly present.  Cardiovascular: Normal rate, regular rhythm and intact distal pulses. Exam reveals no gallop and no friction rub.  Murmur (1/6 SEM) heard. Trace L>RLE edema b/l. no calf TTP.   Pulmonary/Chest: Effort normal and breath sounds normal. No stridor. No respiratory distress. She has no wheezes. She has no rales.  Abdominal: Soft. Bowel sounds are normal. She exhibits no distension and no mass. There is no hepatomegaly. There is no tenderness. There is no rebound and no guarding.  Musculoskeletal: She exhibits edema (small and large joints; L>R knee) and tenderness.  Lymphadenopathy:    She has no cervical adenopathy.  Neurological: She is alert and oriented to person, place, and time. She has normal reflexes. Gait (antalgic) abnormal.  Skin: Skin is warm and dry. No rash noted.  Psychiatric: She has a normal mood and affect. Her behavior is normal. Judgment and thought content normal. Cognition and memory are impaired.    Labs reviewed: Basic Metabolic Panel: Recent Labs    11/01/17 0948 05/21/18 0900  NA 139 140  K 3.7 3.5  CL 107 105  CO2 28 27  GLUCOSE 93 83  BUN 11 10  CREATININE 0.97* 0.91  CALCIUM 9.0 9.1  TSH 1.11  --    Liver Function Tests: Recent Labs    11/01/17 0948 05/21/18 0900  AST 14 14  ALT 10 9  BILITOT 0.6 0.5  PROT 6.7 6.7   No results for input(s): LIPASE, AMYLASE in the last 8760 hours. No results for input(s): AMMONIA in the last 8760 hours. CBC: Recent Labs    11/01/17 0948 05/21/18 0900  WBC 4.2 3.3*  NEUTROABS 2,008 1,284*  HGB 12.7 13.3  HCT 38.4  41.1  MCV 83.7 82.9  PLT 182 196   Lipid Panel: Recent Labs    11/01/17 0948 05/21/18 0900  CHOL 161 155  HDL 59 54  LDLCALC 88 88  TRIG 62 52  CHOLHDL 2.7 2.9   TSH: Recent Labs    11/01/17 0948  TSH 1.11   A1C: No results found for: HGBA1C   Assessment/Plan 1. Need for influenza vaccination - Flu vaccine HIGH DOSE PF (Fluzone High dose)  2. Essential hypertension Off betablocker due to bradycardia, blood pressure stable on lasix.  - CBC with Differential/Platelets; Future  3. Primary osteoarthritis of both knees With ongoing knee pain, using tylenol 500  mg BID and Voltaren gel, some days are bad, no recent xrays. Discussed getting xrays and possible knee injections if pain worsened.    4. Mixed hyperlipidemia -LDL 88, continues on lipitor 40 mg daily  - COMPLETE METABOLIC PANEL WITH GFR; Future - Lipid Panel; Future  5. Allergic rhinitis, unspecified seasonality, unspecified trigger -to avoid benadryl, may use zyrtec 10 mg PRN.   6. Insomnia, unspecified type To avoid benedryl. May use melatonin 3-6 mg by mouth qhs  7. Coronary artery disease involving native coronary artery of native heart without angina pectoris No recent chest pains.  - CBC with Differential/Platelets; Future  8. Dementia without behavioral disturbance, unspecified dementia type (HCC) Stable, slow progressive decline without acute changes. Continues to live at home with husband. Continues on aricept.    Next appt: 11/19/2018 Janene HarveyJessica K. Biagio BorgEubanks, AGNP  [facility] [phone]

## 2018-06-26 ENCOUNTER — Other Ambulatory Visit: Payer: Self-pay | Admitting: *Deleted

## 2018-06-26 MED ORDER — DONEPEZIL HCL 10 MG PO TABS: 10.0000 mg | ORAL_TABLET | Freq: Every day | ORAL | 0 refills | Status: DC

## 2018-06-26 NOTE — Telephone Encounter (Signed)
[facility] 

## 2018-07-19 ENCOUNTER — Other Ambulatory Visit: Payer: Self-pay | Admitting: *Deleted

## 2018-07-19 MED ORDER — CITALOPRAM HYDROBROMIDE 20 MG PO TABS: 20.0000 mg | ORAL_TABLET | Freq: Every day | ORAL | 0 refills | Status: DC

## 2018-07-19 NOTE — Telephone Encounter (Signed)
[facility] 

## 2018-08-31 ENCOUNTER — Other Ambulatory Visit: Payer: Self-pay | Admitting: Nurse Practitioner

## 2018-08-31 NOTE — Telephone Encounter (Signed)
Patient daughter requested refill.  

## 2018-09-04 ENCOUNTER — Encounter: Payer: Self-pay | Admitting: Nurse Practitioner

## 2018-09-20 ENCOUNTER — Other Ambulatory Visit: Payer: Self-pay | Admitting: *Deleted

## 2018-09-20 MED ORDER — DONEPEZIL HCL 10 MG PO TABS: 10.0000 mg | ORAL_TABLET | Freq: Every day | ORAL | 1 refills | Status: DC

## 2018-09-20 NOTE — Telephone Encounter (Signed)
[facility]

## 2018-10-09 ENCOUNTER — Telehealth: Payer: Self-pay | Admitting: *Deleted

## 2018-10-09 NOTE — Telephone Encounter (Signed)
Lets add her in for a televisit tomorrow please

## 2018-10-09 NOTE — Telephone Encounter (Signed)
Laurie Mays, daughter called and stated that they would like a recommendation of a sleeping pill that they could give to patient to help sleep. Stated that patient is currently taking Melatonin 5mg  but it makes her get up at night and walk around and dream.  Husband cannot sleep because she is up all night seeing people and thinking she is in a hotel. Behavior has taken on a whole new affect. Please Advise.   Daughter stated that she would be fine with a TeleVisit.

## 2018-10-09 NOTE — Telephone Encounter (Signed)
Appointment scheduled for tomorrow. Daughter stated to call #[phone] and she will be there with her mother.

## 2018-10-10 ENCOUNTER — Other Ambulatory Visit: Payer: Self-pay

## 2018-10-10 ENCOUNTER — Encounter: Payer: Self-pay | Admitting: Nurse Practitioner

## 2018-10-10 ENCOUNTER — Ambulatory Visit (INDEPENDENT_AMBULATORY_CARE_PROVIDER_SITE_OTHER): Payer: Medicare Other | Admitting: Nurse Practitioner

## 2018-10-10 DIAGNOSIS — F419 Anxiety disorder, unspecified: Secondary | ICD-10-CM

## 2018-10-10 DIAGNOSIS — F329 Major depressive disorder, single episode, unspecified: Secondary | ICD-10-CM | POA: Diagnosis not present

## 2018-10-10 DIAGNOSIS — G4700 Insomnia, unspecified: Secondary | ICD-10-CM

## 2018-10-10 DIAGNOSIS — F32A Depression, unspecified: Secondary | ICD-10-CM

## 2018-10-10 MED ORDER — TRAZODONE HCL 50 MG PO TABS: 25.0000 mg | ORAL_TABLET | Freq: Every day | ORAL | 3 refills | Status: DC

## 2018-10-10 NOTE — Patient Instructions (Signed)
To continue melatonin  Recommend melatonin 3-6 mg at bedtime  To start trazodone 25 mg by mouth daily at bedtime for sleep- can increase to 50 mg after 2 weeks if needed   Keep follow up as schedule but call sooner if needed

## 2018-10-10 NOTE — Progress Notes (Signed)
This service is provided via telemedicine  No vital signs collected/recorded due to the encounter was a telemedicine visit.   Location of patient (ex: home, work):  Home   Patient consents to a telephone visit:  Yes  Location of the provider (ex: office, home):  [employer], Office    Names of all persons participating in the telemedicine service and their role in the encounter:  S.Chrae B/CMA, Abbey Chatters, NP, [name], and Patient    Time spent on call:   Virtual Visit via Telephone Note  I connected with Laurie Mays on 10/10/18 at 10:00 AM EDT by telephone and verified that I am speaking with the correct person using two identifiers.   I discussed the limitations, risks, security and privacy concerns of performing an evaluation and management service by telephone and the availability of in person appointments. I also discussed with the patient that there may be a patient responsible charge related to this service. The patient expressed understanding and agreed to proceed.     Careteam: Patient Care Team: [name], NP as PCP - General (Geriatric Medicine) [name], MD as PCP - Cardiology (Cardiology)  Advanced Directive information    Allergies  Allergen Reactions  . Lisinopril Other (See Comments)  . Losartan Other (See Comments)  . Memantine Other (See Comments)    combative  . Peanut-Containing Drug Products   . Tramadol Hives    Chief Complaint  Patient presents with  . Acute Visit    Sleep concerns      HPI: Patient is a 78 y.o. female with hx of dementia and having trouble sleeping.  pts husband call into office and was very concerned over her sleep patterns. Pt is waking up husband and saying someone is in the house, coming down the hall. Had to turn on all the lights to make sure no one was there. One night she kept him up all night. Pt states that she does not trouble sleeping.  Last night she slept well.  2-3  nights a week for the past week. Just started.  They have been giving her melatonin 10 mg at bedtime.  More anxious/excited at bedtime every night      Review of Systems:  Review of Systems  Constitutional: Negative for chills, fever and malaise/fatigue.  Psychiatric/Behavioral: Positive for hallucinations and memory loss. The patient is nervous/anxious and has insomnia.     Past Medical History:  Diagnosis Date  . Gallbladder disorder 2009   Per New Patient Packet   . Heart disease   . High blood pressure    Per New Patient Packet   . High cholesterol    Past Surgical History:  Procedure Laterality Date  . ABDOMINAL HYSTERECTOMY     Per New Patient Packet   . CARDIAC SURGERY  2011   Open Heart Surgey, Per New Patient Packet    . REPLACEMENT TOTAL KNEE Left 2010   Per New Patient Packet    Social History:   reports that she quit smoking about 31 years ago. She has never used smokeless tobacco. She reports previous drug use. She reports that she does not drink alcohol.  Family History  Problem Relation Age of Onset  . Dementia Mother   . Arthritis Mother   . Heart attack Father   . Arthritis Father   . Heart attack Sister   . Arthritis Sister     Medications: Patient's Medications  New Prescriptions   No medications on file  Previous Medications   ACETAMINOPHEN (TYLENOL) 325 MG TABLET    Take 650 mg by mouth as needed.   ATORVASTATIN (LIPITOR) 40 MG TABLET    TAKE 1 TABLET BY MOUTH EVERY DAY   CITALOPRAM (CELEXA) 20 MG TABLET    Take 1 tablet (20 mg total) by mouth daily.   DICLOFENAC SODIUM (VOLTAREN) 1 % GEL    Apply 4 g topically 4 (four) times daily. To both knees. May apply 2 gm to hands QID for arthritis   DONEPEZIL (ARICEPT) 10 MG TABLET    Take 1 tablet (10 mg total) by mouth at bedtime.   FUROSEMIDE (LASIX) 20 MG TABLET    TAKE 1 TABLET BY MOUTH EVERY DAY AS NEEDED   MULTIPLE VITAMINS-MINERALS (CENTRUM PO)    Take by mouth daily.  Modified Medications    No medications on file  Discontinued Medications   No medications on file     Physical Exam: Unable due to tele-visit    Labs reviewed: Basic Metabolic Panel: Recent Labs    11/01/17 0948 05/21/18 0900  NA 139 140  K 3.7 3.5  CL 107 105  CO2 28 27  GLUCOSE 93 83  BUN 11 10  CREATININE 0.97* 0.91  CALCIUM 9.0 9.1  TSH 1.11  --    Liver Function Tests: Recent Labs    11/01/17 0948 05/21/18 0900  AST 14 14  ALT 10 9  BILITOT 0.6 0.5  PROT 6.7 6.7   No results for input(s): LIPASE, AMYLASE in the last 8760 hours. No results for input(s): AMMONIA in the last 8760 hours. CBC: Recent Labs    11/01/17 0948 05/21/18 0900  WBC 4.2 3.3*  NEUTROABS 2,008 1,284*  HGB 12.7 13.3  HCT 38.4 41.1  MCV 83.7 82.9  PLT 182 196   Lipid Panel: Recent Labs    11/01/17 0948 05/21/18 0900  CHOL 161 155  HDL 59 54  LDLCALC 88 88  TRIG 62 52  CHOLHDL 2.7 2.9   TSH: Recent Labs    11/01/17 0948  TSH 1.11   A1C: No results found for: HGBA1C   Assessment/Plan 1. Insomnia, unspecified type -some nights worse than others. To continue melatonin. Will add trazodone to help with anxiety/depression and sleep.  -encouraged sleep routine.  - traZODone (DESYREL) 50 MG tablet; Take 0.5-1 tablets (25-50 mg total) by mouth at bedtime.  Dispense: 30 tablet; Refill: 3  2. Anxiety and depression Ongoing due to memory loss. Trazodone added for sleep and mood.  - traZODone (DESYREL) 50 MG tablet; Take 0.5-1 tablets (25-50 mg total) by mouth at bedtime.  Dispense: 30 tablet; Refill: 3  Next appt: 11/19/2018 Laurie Mays. Biagio Borg  [facility] [phone]    Follow Up Instructions:    I discussed the assessment and treatment plan with the patient. The patient was provided an opportunity to ask questions and all were answered. The patient agreed with the plan and demonstrated an understanding of the instructions.   The patient was advised to  call back or seek an in-person evaluation if the symptoms worsen or if the condition fails to improve as anticipated.  I provided 14 minutes of non-face-to-face time during this encounter.   Edison Simon [place], [place]

## 2018-10-17 ENCOUNTER — Other Ambulatory Visit: Payer: Self-pay | Admitting: Nurse Practitioner

## 2018-11-01 ENCOUNTER — Other Ambulatory Visit: Payer: Self-pay | Admitting: Nurse Practitioner

## 2018-11-01 DIAGNOSIS — F329 Major depressive disorder, single episode, unspecified: Secondary | ICD-10-CM

## 2018-11-01 DIAGNOSIS — G4700 Insomnia, unspecified: Secondary | ICD-10-CM

## 2018-11-01 DIAGNOSIS — F419 Anxiety disorder, unspecified: Secondary | ICD-10-CM

## 2018-11-01 NOTE — Telephone Encounter (Signed)
Patient requested 90 day supply on medication

## 2018-11-02 ENCOUNTER — Other Ambulatory Visit: Payer: Self-pay | Admitting: Nurse Practitioner

## 2018-11-02 DIAGNOSIS — E782 Mixed hyperlipidemia: Secondary | ICD-10-CM

## 2018-11-12 ENCOUNTER — Telehealth: Payer: Self-pay | Admitting: *Deleted

## 2018-11-12 NOTE — Telephone Encounter (Signed)
Sounds like we need to do a tele-visit to discuss.

## 2018-11-12 NOTE — Telephone Encounter (Signed)
Spoke with [place]. [name] agreed that a telephone visit is necessary. Appointment scheduled for tomorrow. Patients daughter Shawna Orleans has an Iphone and a facetime visit will be an option.   Medication in question is Trazodone   Tonya requested that be call before noon tomorrow.

## 2018-11-12 NOTE — Telephone Encounter (Signed)
[name], daughter called and stated that patient's behavior has gotten worse. Stated that now she is striking at the family. Stated that she is seeing things not there and saying ugly things. Daughter is wanting to know if they can give patient 1/2 tablet at noon time also to see if that will help calm patient down some. Patient is currently taking 1/2 tablet of the 25mg  at bedtime.  Please Advise.

## 2018-11-13 ENCOUNTER — Ambulatory Visit: Payer: Medicare Other | Admitting: Nurse Practitioner

## 2018-11-13 ENCOUNTER — Other Ambulatory Visit: Payer: Self-pay

## 2018-11-13 NOTE — Telephone Encounter (Signed)
Laurie Mays, daughter called and left message on Clinical intake wanting to know if patient could take Trazodone 25mg  1/2 tablet at noon and 1/2 tablet at bedtime.   Patient was scheduled for a TeleVisit today that was canceled. Jessica request to do TeleVisit.    Called and [facility] for Melanie to return call.

## 2018-11-16 NOTE — Telephone Encounter (Signed)
Patient has upcoming appointment on 11/21/2018 (next Wednesday)

## 2018-11-19 ENCOUNTER — Other Ambulatory Visit: Payer: Self-pay

## 2018-11-19 ENCOUNTER — Other Ambulatory Visit: Payer: Medicare Other

## 2018-11-19 DIAGNOSIS — E782 Mixed hyperlipidemia: Secondary | ICD-10-CM

## 2018-11-19 DIAGNOSIS — I2510 Atherosclerotic heart disease of native coronary artery without angina pectoris: Secondary | ICD-10-CM

## 2018-11-19 DIAGNOSIS — I10 Essential (primary) hypertension: Secondary | ICD-10-CM

## 2018-11-19 LAB — CBC WITH DIFFERENTIAL/PLATELET
Absolute Monocytes: 409 cells/uL (ref 200–950)
Basophils Absolute: 22 cells/uL (ref 0–200)
Basophils Relative: 0.5 %
Eosinophils Absolute: 219 cells/uL (ref 15–500)
Eosinophils Relative: 5.1 %
HCT: 41.1 % (ref 35.0–45.0)
Hemoglobin: 13.4 g/dL (ref 11.7–15.5)
Lymphs Abs: 1428 cells/uL (ref 850–3900)
MCH: 27.3 pg (ref 27.0–33.0)
MCHC: 32.6 g/dL (ref 32.0–36.0)
MCV: 83.9 fL (ref 80.0–100.0)
MPV: 10.5 fL (ref 7.5–12.5)
Monocytes Relative: 9.5 %
Neutro Abs: 2223 cells/uL (ref 1500–7800)
Neutrophils Relative %: 51.7 %
Platelets: 142 10*3/uL (ref 140–400)
RBC: 4.9 10*6/uL (ref 3.80–5.10)
RDW: 13.3 % (ref 11.0–15.0)
Total Lymphocyte: 33.2 %
WBC: 4.3 10*3/uL (ref 3.8–10.8)

## 2018-11-19 LAB — COMPLETE METABOLIC PANEL WITH GFR
AG Ratio: 1.3 (calc) (ref 1.0–2.5)
ALT: 9 U/L (ref 6–29)
AST: 13 U/L (ref 10–35)
Albumin: 3.8 g/dL (ref 3.6–5.1)
Alkaline phosphatase (APISO): 78 U/L (ref 37–153)
BUN/Creatinine Ratio: 10 (calc) (ref 6–22)
BUN: 10 mg/dL (ref 7–25)
CO2: 28 mmol/L (ref 20–32)
Calcium: 9.2 mg/dL (ref 8.6–10.4)
Chloride: 107 mmol/L (ref 98–110)
Creat: 0.97 mg/dL — ABNORMAL HIGH (ref 0.60–0.88)
GFR, Est African American: 64 mL/min/{1.73_m2} (ref >=60)
GFR, Est Non African American: 55 mL/min/{1.73_m2} — ABNORMAL LOW (ref >=60)
Globulin: 2.9 g/dL (calc) (ref 1.9–3.7)
Glucose, Bld: 91 mg/dL (ref 65–99)
Potassium: 3.5 mmol/L (ref 3.5–5.3)
Sodium: 140 mmol/L (ref 135–146)
Total Bilirubin: 0.6 mg/dL (ref 0.2–1.2)
Total Protein: 6.7 g/dL (ref 6.1–8.1)

## 2018-11-19 LAB — LIPID PANEL
Cholesterol: 174 mg/dL (ref <200)
HDL: 61 mg/dL (ref >=50)
LDL Cholesterol (Calc): 100 mg/dL (calc) — ABNORMAL HIGH
Non-HDL Cholesterol (Calc): 113 mg/dL (calc) (ref <130)
Total CHOL/HDL Ratio: 2.9 (calc) (ref <5.0)
Triglycerides: 52 mg/dL (ref <150)

## 2018-11-21 ENCOUNTER — Other Ambulatory Visit: Payer: Self-pay

## 2018-11-21 ENCOUNTER — Ambulatory Visit (INDEPENDENT_AMBULATORY_CARE_PROVIDER_SITE_OTHER): Payer: Medicare Other | Admitting: Nurse Practitioner

## 2018-11-21 ENCOUNTER — Encounter: Payer: Self-pay | Admitting: Nurse Practitioner

## 2018-11-21 DIAGNOSIS — G4700 Insomnia, unspecified: Secondary | ICD-10-CM | POA: Diagnosis not present

## 2018-11-21 DIAGNOSIS — F419 Anxiety disorder, unspecified: Secondary | ICD-10-CM | POA: Diagnosis not present

## 2018-11-21 DIAGNOSIS — I10 Essential (primary) hypertension: Secondary | ICD-10-CM

## 2018-11-21 DIAGNOSIS — M2550 Pain in unspecified joint: Secondary | ICD-10-CM | POA: Diagnosis not present

## 2018-11-21 DIAGNOSIS — R600 Localized edema: Secondary | ICD-10-CM

## 2018-11-21 DIAGNOSIS — F32A Depression, unspecified: Secondary | ICD-10-CM

## 2018-11-21 DIAGNOSIS — E782 Mixed hyperlipidemia: Secondary | ICD-10-CM

## 2018-11-21 DIAGNOSIS — F329 Major depressive disorder, single episode, unspecified: Secondary | ICD-10-CM

## 2018-11-21 DIAGNOSIS — I2510 Atherosclerotic heart disease of native coronary artery without angina pectoris: Secondary | ICD-10-CM

## 2018-11-21 DIAGNOSIS — F0390 Unspecified dementia without behavioral disturbance: Secondary | ICD-10-CM

## 2018-11-21 MED ORDER — TRAZODONE HCL 50 MG PO TABS: 25.0000 mg | ORAL_TABLET | Freq: Two times a day (BID) | ORAL | 0 refills | Status: DC

## 2018-11-21 MED ORDER — DICLOFENAC SODIUM 1 % TD GEL: 4.0000 g | Freq: Four times a day (QID) | TRANSDERMAL | 6 refills | Status: DC

## 2018-11-21 NOTE — Progress Notes (Signed)
This service is provided via telemedicine  No vital signs collected/recorded due to the encounter was a telemedicine visit.   Location of patient (ex: home, work):  Home  Patient consents to a telephone visit: Yes  Location of the provider (ex: office, home):  [facility], Office   Name of any referring provider:  N/A  Names of all persons participating in the telemedicine service and their role in the encounter:  Laurie Mays, Laurie Chatters, NP, daughter(Laurie Mays) and Patient   Time spent on call:  7 min with medical assistant      Careteam: Patient Care Team: Laurie Seller, NP as PCP - General (Geriatric Medicine) Laurie Red, MD as PCP - Cardiology (Cardiology)  Advanced Directive information Does Patient Have a Medical Advance Directive?: Yes, Type of Advance Directive: Healthcare Power of Attorney, Does patient want to make changes to medical advance directive?: No - Patient declined  Allergies  Allergen Reactions  . Lisinopril Other (See Comments)  . Losartan Other (See Comments)  . Memantine Other (See Comments)    combative  . Peanut-Containing Drug Products   . Tramadol Hives    Chief Complaint  Patient presents with  . Medical Management of Chronic Issues    6 month follow-up and discuss labs (patient can view on mychart). Patient c/o knee and leg pain  . Immunizations    Discuss need for shingrix  . Medication Refill    Voltaren Gel      HPI: Patient is a 78 y.o. female for routine follow up.   OA- reports she take tylenol 500 mg 1 tablet in the morning and 1 in the evening which helps the pain.  Hyperlipidemia- currently on Lipitor 40, LDL now at 100, has been drinking more pepsi and chocolate chip cookies.   Depression/anxiety- started trazodone 25 mg by mouth daily at bedtime. If she takes a whole tablet she starts having vivid dreams.  She is going stir-crazy wanting to go places and does not understand she can not  go during the pandemic  Always on go.  Continues to take celexa 20 mg by mouth daily  Daughter does not want to over-medicate her but "she is always on go"  Dementia- no acute decline but has had progressive decline, lives with husband. Does her own ADLs but requires a lot of supervision. Continues on Aricept. 10 mg daily   htn- has been controlled on metoprolol and lasix (uses lasix to help with lower leg swelling)   Review of Systems:  Review of Systems  Constitutional: Negative for chills, fever and weight loss.  HENT: Negative for tinnitus.   Respiratory: Negative for cough, sputum production and shortness of breath.   Cardiovascular: Negative for chest pain, palpitations and leg swelling.  Gastrointestinal: Negative for abdominal pain, constipation, diarrhea and heartburn.  Genitourinary: Negative for dysuria, frequency and urgency.  Musculoskeletal: Positive for joint pain. Negative for back pain, falls and myalgias.  Skin: Negative.   Neurological: Negative for dizziness and headaches.  Psychiatric/Behavioral: Positive for memory loss. Negative for depression. The patient is nervous/anxious. The patient does not have insomnia.     Past Medical History:  Diagnosis Date  . Gallbladder disorder 2009   Per New Patient Packet   . Heart disease   . High blood pressure    Per New Patient Packet   . High cholesterol    Past Surgical History:  Procedure Laterality Date  . ABDOMINAL HYSTERECTOMY     Per New Patient  Packet   . CARDIAC SURGERY  2011   Open Heart Surgey, Per New Patient Packet    . REPLACEMENT TOTAL KNEE Left 2010   Per New Patient Packet    Social History:   reports that she quit smoking about 31 years ago. She has never used smokeless tobacco. She reports previous drug use. She reports that she does not drink alcohol.  Family History  Problem Relation Age of Onset  . Dementia Mother   . Arthritis Mother   . Heart attack Father   . Arthritis Father   .  Heart attack Sister   . Arthritis Sister     Medications: Patient's Medications  New Prescriptions   No medications on file  Previous Medications   ACETAMINOPHEN (TYLENOL) 325 MG TABLET    Take 650 mg by mouth as needed.   ATORVASTATIN (LIPITOR) 40 MG TABLET    TAKE 1 TABLET BY MOUTH EVERY DAY   CITALOPRAM (CELEXA) 20 MG TABLET    TAKE 1 TABLET BY MOUTH EVERY DAY   DICLOFENAC SODIUM (VOLTAREN) 1 % GEL    Apply 4 g topically 4 (four) times daily. To both knees. May apply 2 gm to hands QID for arthritis   DONEPEZIL (ARICEPT) 10 MG TABLET    Take 1 tablet (10 mg total) by mouth at bedtime.   FUROSEMIDE (LASIX) 20 MG TABLET    TAKE 1 TABLET BY MOUTH EVERY DAY AS NEEDED   MULTIPLE VITAMINS-MINERALS (CENTRUM PO)    Take by mouth daily.   TRAZODONE (DESYREL) 50 MG TABLET    TAKE 1/2 TO 1 TABLET BY MOUTH EVERY DAY AT BEDTIME  Modified Medications   No medications on file  Discontinued Medications   No medications on file     Physical Exam: unable due to televisit.     Labs reviewed: Basic Metabolic Panel: Recent Labs    05/21/18 0900 11/19/18 0906  NA 140 140  K 3.5 3.5  CL 105 107  CO2 27 28  GLUCOSE 83 91  BUN 10 10  CREATININE 0.91 0.97*  CALCIUM 9.1 9.2   Liver Function Tests: Recent Labs    05/21/18 0900 11/19/18 0906  AST 14 13  ALT 9 9  BILITOT 0.5 0.6  PROT 6.7 6.7   No results for input(s): LIPASE, AMYLASE in the last 8760 hours. No results for input(s): AMMONIA in the last 8760 hours. CBC: Recent Labs    05/21/18 0900 11/19/18 0906  WBC 3.3* 4.3  NEUTROABS 1,284* 2,223  HGB 13.3 13.4  HCT 41.1 41.1  MCV 82.9 83.9  PLT 196 142   Lipid Panel: Recent Labs    05/21/18 0900 11/19/18 0906  CHOL 155 174  HDL 54 61  LDLCALC 88 100*  TRIG 52 52  CHOLHDL 2.9 2.9   TSH: No results for input(s): TSH in the last 8760 hours. A1C: No results found for: HGBA1C   Assessment/Plan 1. Arthralgia of multiple joints Ongoing, using tylenol 1 tablet  twice daily. Can use tylenol 500 mg 1-2 tablets every 8 hours as needed pain. Continues to use Voltaren gel as needed  - diclofenac sodium (VOLTAREN) 1 % GEL; Apply 4 g topically 4 (four) times daily. To both knees. May apply 2 gm to hands QID for arthritis  Dispense: 100 g; Refill: 6  2. Insomnia, unspecified type Improved on trazodone, can only use 25 mg due to vivid dreams with 50 mg dosing. - traZODone (DESYREL) 50 MG tablet; Take 0.5 tablets (25  mg total) by mouth 2 (two) times daily.  Dispense: 90 tablet; Refill: 0  3. Anxiety and depression Ongoing. Continues on celexa. Will increase trazodone to 25 mg by mouth twice daily to help improve.  - traZODone (DESYREL) 50 MG tablet; Take 0.5 tablets (25 mg total) by mouth 2 (two) times daily.  Dispense: 90 tablet; Refill: 0  4. Essential hypertension -has been controlled on lasix daily but has not checked recently. Will have family member come by to check and notify if it is elevated. Goal <140/90  5. 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  6. Dementia without behavioral disturbance, unspecified dementia type (HCC) Progressive decline. Did not tolerate namenda, continues on aricept. Encouraged activities and things to do to keep her stimulated.   7. Mixed hyperlipidemia LDL 100, continues on lipitor 40 mg daily   8. Bilateral leg edema Controlled on lasix 40 mg daily.   Next appt: 01/28/2019 Janene Harvey. Biagio Borg  [facility] [phone]    Virtual Visit via Telephone Note  I connected with on 11/21/18 at  3:15 PM EDT by telephone and verified that I am speaking with the correct person using two identifiers.  Location: Patient: home Provider: office   I discussed the limitations, risks, security and privacy concerns of performing an evaluation and management service by telephone and the availability of in  person appointments. I also discussed with the patient that there may be a patient responsible charge related to this service. The patient expressed understanding and agreed to proceed.   I discussed the assessment and treatment plan with the patient. The patient was provided an opportunity to ask questions and all were answered. The patient agreed with the plan and demonstrated an understanding of the instructions.   The patient was advised to call back or seek an in-person evaluation if the symptoms worsen or if the condition fails to improve as anticipated.  I provided 18 minutes of non-face-to-face time during this encounter.  Janene Harvey. Biagio Borg Avs printed and mailed

## 2018-11-21 NOTE — Patient Instructions (Signed)
To increase trazodone to 25 mg by mouth twice daily to help with mood  Blood pressure should be <140/90  Recommend activities to keep busy and stimulate mind.

## 2019-01-14 ENCOUNTER — Encounter: Payer: Self-pay | Admitting: Family

## 2019-01-14 ENCOUNTER — Ambulatory Visit (INDEPENDENT_AMBULATORY_CARE_PROVIDER_SITE_OTHER): Payer: Medicare Other | Admitting: Family

## 2019-01-14 VITALS — BP 138/70 | HR 59 | Temp 98.5°F | Ht 64.0 in | Wt 235.0 lb

## 2019-01-14 DIAGNOSIS — M2550 Pain in unspecified joint: Secondary | ICD-10-CM | POA: Diagnosis not present

## 2019-01-14 DIAGNOSIS — M2540 Effusion, unspecified joint: Secondary | ICD-10-CM | POA: Diagnosis not present

## 2019-01-14 DIAGNOSIS — R05 Cough: Secondary | ICD-10-CM | POA: Diagnosis not present

## 2019-01-14 DIAGNOSIS — R059 Cough, unspecified: Secondary | ICD-10-CM

## 2019-01-14 MED ORDER — PREDNISONE 10 MG PO TABS: 10.0000 mg | ORAL_TABLET | Freq: Every day | ORAL | 0 refills | Status: DC
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
## 2019-01-14 NOTE — Patient Instructions (Signed)
1. Take Prednisone 20 mg ( 2 tablets) tablet one by mouth daily x 3 days then Prednisone 10 mg tablet one by mouth daily x 3 days then  Prednisone 5 mg tablet ( 1/2Tablet) one by mouth daily x 3 days then stop.  2. Take Robitussin syrup 10 cc by mouth every 8 hours as needed for cough   3. Notify [place] office if cough worsen or not resolved or running any fever > 100.5 or having any shortness of breath.

## 2019-01-15 LAB — CBC WITH DIFFERENTIAL/PLATELET
Absolute Monocytes: 427 cells/uL (ref 200–950)
Basophils Absolute: 19 cells/uL (ref 0–200)
Basophils Relative: 0.4 %
Eosinophils Absolute: 158 cells/uL (ref 15–500)
Eosinophils Relative: 3.3 %
HCT: 40 % (ref 35.0–45.0)
Hemoglobin: 13.3 g/dL (ref 11.7–15.5)
Lymphs Abs: 1714 cells/uL (ref 850–3900)
MCH: 27.8 pg (ref 27.0–33.0)
MCHC: 33.3 g/dL (ref 32.0–36.0)
MCV: 83.7 fL (ref 80.0–100.0)
MPV: 10.9 fL (ref 7.5–12.5)
Monocytes Relative: 8.9 %
Neutro Abs: 2482 cells/uL (ref 1500–7800)
Neutrophils Relative %: 51.7 %
Platelets: 158 10*3/uL (ref 140–400)
RBC: 4.78 10*6/uL (ref 3.80–5.10)
RDW: 13.7 % (ref 11.0–15.0)
Total Lymphocyte: 35.7 %
WBC: 4.8 10*3/uL (ref 3.8–10.8)

## 2019-01-15 LAB — SEDIMENTATION RATE: Sed Rate: 19 mm/h (ref 0–30)

## 2019-01-15 LAB — URIC ACID: Uric Acid, Serum: 5.5 mg/dL (ref 2.5–7.0)

## 2019-01-15 LAB — C-REACTIVE PROTEIN: CRP: 11.5 mg/L — ABNORMAL HIGH (ref <8.0)

## 2019-01-16 ENCOUNTER — Other Ambulatory Visit: Payer: Self-pay | Admitting: Nurse Practitioner

## 2019-01-21 ENCOUNTER — Ambulatory Visit: Payer: Self-pay

## 2019-01-21 ENCOUNTER — Encounter: Payer: Self-pay | Admitting: Family

## 2019-01-28 ENCOUNTER — Other Ambulatory Visit: Payer: Self-pay

## 2019-01-28 ENCOUNTER — Encounter: Payer: Self-pay | Admitting: Nurse Practitioner

## 2019-01-28 ENCOUNTER — Ambulatory Visit: Payer: Medicare Other | Admitting: Nurse Practitioner

## 2019-01-28 DIAGNOSIS — E2839 Other primary ovarian failure: Secondary | ICD-10-CM

## 2019-01-28 DIAGNOSIS — Z0000 Encounter for general adult medical examination without abnormal findings: Secondary | ICD-10-CM

## 2019-01-28 NOTE — Progress Notes (Signed)
Subjective:   Laurie Mays is a 78 y.o. female who presents for Medicare Annual (Subsequent) preventive examination.  Review of Systems:       Objective:     Vitals: There were no vitals taken for this visit.  There is no height or weight on file to calculate BMI.  Advanced Directives 01/28/2019 11/21/2018 05/23/2018 01/19/2018 01/19/2018 12/29/2017 12/25/2017  Does Patient Have a Medical Advance Directive? Yes Yes Yes Yes Yes No No  Type of Social research officer, governmentAdvance Directive Healthcare Power of Attorney Healthcare Power of Attorney Healthcare Power of [employer] Healthcare Power of [employer] Healthcare Power of Attorney - -  Does patient want to make changes to medical advance directive? No - Guardian declined No - Patient declined - No - Patient declined No - Patient declined - -  Copy of Healthcare Power of Attorney in Chart? Yes - validated most recent copy scanned in chart (See row information) - - Yes Yes - -    Tobacco Social History   Tobacco Use  Smoking Status Former Smoker  . Quit date: 07/12/1987  . Years since quitting: 31.5  Smokeless Tobacco Never Used  Tobacco Comment   Quit about 30 years ago as of 2019      Counseling given: Not Answered Comment: Quit about 30 years ago as of 2019    Clinical Intake:  Pre-visit preparation completed: Yes  Pain : No/denies pain Pain Score: 6  Pain Type: Chronic pain Pain Location: Knee Pain Orientation: (bilateral knees) Pain Descriptors / Indicators: Aching, Constant Pain Onset: More than a month ago Pain Frequency: Constant Pain Relieving Factors: tylenol helps  Pain Relieving Factors: tylenol helps  Nutritional Status: BMI > 30  Obese Nutritional Risks: None Diabetes: No  How often do you need to have someone help you when you read instructions, pamphlets, or other written materials from your doctor or pharmacy?: 1 - Never What is the last grade level you completed in school?: 12th grade  Interpreter Needed?: No     Past  Medical History:  Diagnosis Date  . Gallbladder disorder 2009   Per New Patient Packet   . Heart disease   . High blood pressure    Per New Patient Packet   . High cholesterol    Past Surgical History:  Procedure Laterality Date  . ABDOMINAL HYSTERECTOMY     Per New Patient Packet   . CARDIAC SURGERY  2011   Open Heart Surgey, Per New Patient Packet    . REPLACEMENT TOTAL KNEE Left 2010   Per New Patient Packet    Family History  Problem Relation Age of Onset  . Dementia Mother   . Arthritis Mother   . Heart attack Father   . Arthritis Father   . Heart attack Sister   . Arthritis Sister    Social History   Socioeconomic History  . Marital status: Married    Spouse name: Not on file  . Number of children: Not on file  . Years of education: Not on file  . Highest education level: Not on file  Occupational History  . Not on file  Social Needs  . Financial resource strain: Not hard at all  . Food insecurity    Worry: Never true    Inability: Never true  . Transportation needs    Medical: No    Non-medical: No  Tobacco Use  . Smoking status: Former Smoker    Quit date: 07/12/1987    Years since quitting: 31.5  .  Smokeless tobacco: Never Used  . Tobacco comment: Quit about 30 years ago as of 2019   Substance and Sexual Activity  . Alcohol use: Never    Frequency: Never  . Drug use: Not Currently  . Sexual activity: Not on file  Lifestyle  . Physical activity    Days per week: 0 days    Minutes per session: 0 min  . Stress: Not at all  Relationships  . Social connections    Talks on phone: More than three times a week    Gets together: More than three times a week    Attends religious service: More than 4 times per year    Active member of club or organization: No    Attends meetings of clubs or organizations: Never    Relationship status: Married  Other Topics Concern  . Not on file  Social History Narrative   Diet: N/A      Caffeine: Coffee and Pepsi       Married, if yes what year: Yes, 1961      Do you live in a house, apartment, assisted living, condo, trailer, ect: House, one stories, 1 person       Pets: None      Current/Past profession: Geologist, engineeringTeacher Assistant       Exercise: No          Living Will: Yes   DNR:   POA/HPOA:      Functional Status:   Do you have difficulty bathing or dressing yourself? No   Do you have difficulty preparing food or eating? Yes   Do you have difficulty managing your medications? Yes    Do you have difficulty managing your finances? Yes    Do you have difficulty affording your medications? No    Outpatient Encounter Medications as of 01/28/2019  Medication Sig  . acetaminophen (TYLENOL) 325 MG tablet Take 650 mg by mouth as needed.  Marland Kitchen. atorvastatin (LIPITOR) 40 MG tablet TAKE 1 TABLET BY MOUTH EVERY DAY  . citalopram (CELEXA) 20 MG tablet TAKE 1 TABLET BY MOUTH EVERY DAY  . diclofenac sodium (VOLTAREN) 1 % GEL Apply 4 g topically 4 (four) times daily. To both knees. May apply 2 gm to hands QID for arthritis  . donepezil (ARICEPT) 10 MG tablet Take 1 tablet (10 mg total) by mouth at bedtime.  . furosemide (LASIX) 20 MG tablet TAKE 1 TABLET BY MOUTH EVERY DAY AS NEEDED  . Multiple Vitamins-Minerals (CENTRUM PO) Take by mouth daily.  . traZODone (DESYREL) 50 MG tablet Take 0.5 tablets (25 mg total) by mouth 2 (two) times daily.  . [DISCONTINUED] predniSONE (DELTASONE) 10 MG tablet Take 1 tablet (10 mg total) by mouth daily with breakfast. 20 mg tablet ( 2 tablets ) one by mouth daily x 3 days then 10 mg tablet one by mouth daily x 3 days then  5 mg Tablet (1/2 Tablet ) one by mouth daily x 3 days then stop.   No facility-administered encounter medications on file as of 01/28/2019.     Activities of Daily Living In your present state of health, do you have any difficulty performing the following activities: 01/28/2019  Hearing? Y  Vision? Y  Difficulty concentrating or making decisions? Y   Walking or climbing stairs? Y  Dressing or bathing? Y  Doing errands, shopping? Y  Preparing Food and eating ? Y  Comment husbands prepares food, no trouble eating  Using the Toilet? N  Comment uses toliet seat  In the past six months, have you accidently leaked urine? Y  Do you have problems with loss of bowel control? N  Managing your Medications? Y  Comment daughter does medication  Managing your Finances? Y  Comment husband and children help wtih finances  Housekeeping or managing your Housekeeping? Y  Some recent data might be hidden    Patient Care Team: Lauree Chandler, NP as PCP - General (Geriatric Medicine) Buford Dresser, MD as PCP - Cardiology (Cardiology)    Assessment:   This is a routine wellness examination for [place].  Exercise Activities and Dietary recommendations Current Exercise Habits: The patient does not participate in regular exercise at present, Exercise limited by: orthopedic condition(s)(knee pain)  Goals    . Weight (lb) < 200 lb (90.7 kg)     To lose weight with healthy food choices        Fall Risk Fall Risk  01/28/2019 01/14/2019 11/21/2018 05/23/2018 01/19/2018  Falls in the past year? 0 0 0 0 No  Number falls in past yr: 0 0 0 - -  Injury with Fall? 0 0 0 - -   Is the patient's home free of loose throw rugs in walkways, pet beds, electrical cords, etc?   no      Grab bars in the bathroom? yes      Handrails on the stairs?   yes      Adequate lighting?   yes  Timed Get Up and Go performed: na  Depression Screen PHQ 2/9 Scores 01/28/2019 11/21/2018 01/19/2018 01/19/2018  PHQ - 2 Score 0 0 0 0     Cognitive Function MMSE - Mini Mental State Exam 01/19/2018  Orientation to time 1  Orientation to Place 3  Registration 3  Attention/ Calculation 5  Recall 0  Language- name 2 objects 2  Language- repeat 1  Language- follow 3 step command 3  Language- read & follow direction 1  Write a sentence 1  Copy design 0  Total score  20     6CIT Screen 01/28/2019  What Year? 0 points  What month? 0 points  What time? 0 points  Count back from 20 2 points  Months in reverse 0 points  Repeat phrase 4 points  Total Score 6    Immunization History  Administered Date(s) Administered  . Influenza, High Dose Seasonal PF 07/11/1968, 05/25/2017, 05/23/2018  . Pneumococcal Conjugate-13 01/19/2018  . Tdap 07/11/1988    Qualifies for Shingles Vaccine? Yes   Screening Tests Health Maintenance  Topic Date Due  . DEXA SCAN  07/28/2003  . PNA vac Low Risk Adult (2 of 2 - PPSV23) 01/20/2019  . TETANUS/TDAP  05/24/2019 (Originally 07/11/1998)  . INFLUENZA VACCINE  02/09/2019    Cancer Screenings: Lung: Low Dose CT Chest recommended if Age 90-80 years, 30 pack-year currently smoking OR have quit w/in 15years. Patient does not qualify. Breast:  Up to date on Mammogram? Yes   Up to date of Bone Density/Dexa? No Colorectal: up to date, cologuard 2019  Additional Screenings:  Hepatitis C Screening: na     Plan:      I have personally reviewed and noted the following in the patient's chart:   . Medical and social history . Use of alcohol, tobacco or illicit drugs  . Current medications and supplements . Functional ability and status . Nutritional status . Physical activity . Advanced directives . List of other physicians . Hospitalizations, surgeries, and ER visits in previous 12 months .  Vitals . Screenings to include cognitive, depression, and falls . Referrals and appointments  In addition, I have reviewed and discussed with patient certain preventive protocols, quality metrics, and best practice recommendations. A written personalized care plan for preventive services as well as general preventive health recommendations were provided to patient.     Sharon SellerJessica K , NP  01/28/2019

## 2019-01-28 NOTE — Progress Notes (Signed)
   This service is provided via telemedicine  No vital signs collected/recorded due to the encounter was a telemedicine visit.   Location of patient (ex: home, work):  Home  Patient consents to a telephone visit: Yes  Location of the provider (ex: office, home):  [facility], Office   Name of any referring provider: N/A  Names of all persons participating in the telemedicine service and their role in the encounter:  S.Chrae B/CMA, Sherrie Mustache, NP, Kenney Houseman (daughter), [name] (daughter) and Mr.Cosey, and Patient   Time spent on call:  13 min with patient

## 2019-01-28 NOTE — Patient Instructions (Addendum)
Laurie Mays , Thank you for taking time to come for your Medicare Wellness Visit. I appreciate your ongoing commitment to your health goals. Please review the following plan we discussed and let me know if I can assist you in the future.   Screening recommendations/referrals: Colonoscopy up to date Mammogram to schedule Bone Density  To schedule will be done at [facility] Recommended yearly ophthalmology/optometry visit for glaucoma screening and checkup Recommended yearly dental visit for hygiene and checkup  Vaccinations: Influenza vaccine due 02/2019- can get at [facility] Pneumococcal vaccine DUE next time in office Tdap vaccine 05/2019 Shingles vaccine-  to get at [facility]     Advanced directives: on file Conditions/risks identified: cardiovascular disease and worsening memory decline  Next appointment: 1 year    Preventive Care 77 Years and Older, Female Preventive care refers to lifestyle choices and visits with your health care provider that can promote health and wellness. What does preventive care include?  A yearly physical exam. This is also called an annual well check.  Dental exams once or twice a year.  Routine eye exams. Ask your health care provider how often you should have your eyes checked.  Personal lifestyle choices, including:  Daily care of your teeth and gums.  Regular physical activity.  Eating a healthy diet.  Avoiding tobacco and drug use.  Limiting alcohol use.  Practicing safe sex.  Taking low-dose aspirin every day.  Taking vitamin and mineral supplements as recommended by your health care provider. What happens during an annual well check? The services and screenings done by your health care provider during your annual well check will depend on your age, overall health, lifestyle risk factors, and family history of disease. Counseling  Your health care provider may ask you questions about your:  Alcohol use.  Tobacco use.  Drug use.   Emotional well-being.  Home and relationship well-being.  Sexual activity.  Eating habits.  History of falls.  Memory and ability to understand (cognition).  Work and work Statistician.  Reproductive health. Screening  You may have the following tests or measurements:  Height, weight, and BMI.  Blood pressure.  Lipid and cholesterol levels. These may be checked every 5 years, or more frequently if you are over 82 years old.  Skin check.  Lung cancer screening. You may have this screening every year starting at age 60 if you have a 30-pack-year history of smoking and currently smoke or have quit within the past 15 years.  Fecal occult blood test (FOBT) of the stool. You may have this test every year starting at age 70.  Flexible sigmoidoscopy or colonoscopy. You may have a sigmoidoscopy every 5 years or a colonoscopy every 10 years starting at age 53.  Hepatitis C blood test.  Hepatitis B blood test.  Sexually transmitted disease (STD) testing.  Diabetes screening. This is done by checking your blood sugar (glucose) after you have not eaten for a while (fasting). You may have this done every 1-3 years.  Bone density scan. This is done to screen for osteoporosis. You may have this done starting at age 90.  Mammogram. This may be done every 1-2 years. Talk to your health care provider about how often you should have regular mammograms. Talk with your health care provider about your test results, treatment options, and if necessary, the need for more tests. Vaccines  Your health care provider may recommend certain vaccines, such as:  Influenza vaccine. This is recommended every year.  Tetanus,  diphtheria, and acellular pertussis (Tdap, Td) vaccine. You may need a Td booster every 10 years.  Zoster vaccine. You may need this after age 31.  Pneumococcal 13-valent conjugate (PCV13) vaccine. One dose is recommended after age 62.  Pneumococcal polysaccharide (PPSV23)  vaccine. One dose is recommended after age 83. Talk to your health care provider about which screenings and vaccines you need and how often you need them. This information is not intended to replace advice given to you by your health care provider. Make sure you discuss any questions you have with your health care provider. Document Released: 07/24/2015 Document Revised: 03/16/2016 Document Reviewed: 04/28/2015 Elsevier Interactive Patient Education  2017 [place] Prevention in the Home Falls can cause injuries. They can happen to people of all ages. There are many things you can do to make your home safe and to help prevent falls. What can I do on the outside of my home?  Regularly fix the edges of walkways and driveways and fix any cracks.  Remove anything that might make you trip as you walk through a door, such as a raised step or threshold.  Trim any bushes or trees on the path to your home.  Use bright outdoor lighting.  Clear any walking paths of anything that might make someone trip, such as rocks or tools.  Regularly check to see if handrails are loose or broken. Make sure that both sides of any steps have handrails.  Any raised decks and porches should have guardrails on the edges.  Have any leaves, snow, or ice cleared regularly.  Use sand or salt on walking paths during winter.  Clean up any spills in your garage right away. This includes oil or grease spills. What can I do in the bathroom?  Use night lights.  Install grab bars by the toilet and in the tub and shower. Do not use towel bars as grab bars.  Use non-skid mats or decals in the tub or shower.  If you need to sit down in the shower, use a plastic, non-slip stool.  Keep the floor dry. Clean up any water that spills on the floor as soon as it happens.  Remove soap buildup in the tub or shower regularly.  Attach bath mats securely with double-sided non-slip rug tape.  Do not have throw rugs  and other things on the floor that can make you trip. What can I do in the bedroom?  Use night lights.  Make sure that you have a light by your bed that is easy to reach.  Do not use any sheets or blankets that are too big for your bed. They should not hang down onto the floor.  Have a firm chair that has side arms. You can use this for support while you get dressed.  Do not have throw rugs and other things on the floor that can make you trip. What can I do in the kitchen?  Clean up any spills right away.  Avoid walking on wet floors.  Keep items that you use a lot in easy-to-reach places.  If you need to reach something above you, use a strong step stool that has a grab bar.  Keep electrical cords out of the way.  Do not use floor polish or wax that makes floors slippery. If you must use wax, use non-skid floor wax.  Do not have throw rugs and other things on the floor that can make you trip. What can I do with  my stairs?  Do not leave any items on the stairs.  Make sure that there are handrails on both sides of the stairs and use them. Fix handrails that are broken or loose. Make sure that handrails are as long as the stairways.  Check any carpeting to make sure that it is firmly attached to the stairs. Fix any carpet that is loose or worn.  Avoid having throw rugs at the top or bottom of the stairs. If you do have throw rugs, attach them to the floor with carpet tape.  Make sure that you have a light switch at the top of the stairs and the bottom of the stairs. If you do not have them, ask someone to add them for you. What else can I do to help prevent falls?  Wear shoes that:  Do not have high heels.  Have rubber bottoms.  Are comfortable and fit you well.  Are closed at the toe. Do not wear sandals.  If you use a stepladder:  Make sure that it is fully opened. Do not climb a closed stepladder.  Make sure that both sides of the stepladder are locked into  place.  Ask someone to hold it for you, if possible.  Clearly mark and make sure that you can see:  Any grab bars or handrails.  First and last steps.  Where the edge of each step is.  Use tools that help you move around (mobility aids) if they are needed. These include:  Canes.  Walkers.  Scooters.  Crutches.  Turn on the lights when you go into a dark area. Replace any light bulbs as soon as they burn out.  Set up your furniture so you have a clear path. Avoid moving your furniture around.  If any of your floors are uneven, fix them.  If there are any pets around you, be aware of where they are.  Review your medicines with your doctor. Some medicines can make you feel dizzy. This can increase your chance of falling. Ask your doctor what other things that you can do to help prevent falls. This information is not intended to replace advice given to you by your health care provider. Make sure you discuss any questions you have with your health care provider. Document Released: 04/23/2009 Document Revised: 12/03/2015 Document Reviewed: 08/01/2014 Elsevier Interactive Patient Education  2017 [employer].

## 2019-01-29 ENCOUNTER — Other Ambulatory Visit: Payer: Self-pay | Admitting: Nurse Practitioner

## 2019-01-29 DIAGNOSIS — Z1231 Encounter for screening mammogram for malignant neoplasm of breast: Secondary | ICD-10-CM

## 2019-02-13 ENCOUNTER — Other Ambulatory Visit: Payer: Self-pay

## 2019-02-13 ENCOUNTER — Encounter: Payer: Self-pay | Admitting: Nurse Practitioner

## 2019-02-13 ENCOUNTER — Ambulatory Visit (INDEPENDENT_AMBULATORY_CARE_PROVIDER_SITE_OTHER): Payer: Medicare Other | Admitting: Nurse Practitioner

## 2019-02-13 DIAGNOSIS — R05 Cough: Secondary | ICD-10-CM

## 2019-02-13 DIAGNOSIS — R059 Cough, unspecified: Secondary | ICD-10-CM

## 2019-02-13 NOTE — Patient Instructions (Signed)
To use delsym 5 cc every 12 hours for cough If congestions occurs to stop delsym and start [place] DM by mouth twice daily with full glass of water To increase fluid intake   Cough, Adult A cough helps to clear your throat and lungs. A cough may be a sign of an illness or another medical condition. An acute cough may only last 2-3 weeks, while a chronic cough may last 8 or more weeks. Many things can cause a cough. They include:  Germs (viruses or bacteria) that attack the airway.  Breathing in things that bother (irritate) your lungs.  Allergies.  Asthma.  Mucus that runs down the back of your throat (postnasal drip).  Smoking.  Acid backing up from the stomach into the tube that moves food from the mouth to the stomach (gastroesophageal reflux).  Some medicines.  Lung problems.  Other medical conditions, such as heart failure or a blood clot in the lung (pulmonary embolism). Follow these instructions at home: Medicines  Take over-the-counter and prescription medicines only as told by your doctor.  Talk with your doctor before you take medicines that stop a cough (coughsuppressants). Lifestyle   Do not smoke, and try not to be around smoke. Do not use any products that contain nicotine or tobacco, such as cigarettes, e-cigarettes, and chewing tobacco. If you need help quitting, ask your doctor.  Drink enough fluid to keep your pee (urine) pale yellow.  Avoid caffeine.  Do not drink alcohol if your doctor tells you not to drink. General instructions   Watch for any changes in your cough. Tell your doctor about them.  Always cover your mouth when you cough.  Stay away from things that make you cough, such as perfume, candles, campfire smoke, or cleaning products.  If the air is dry, use a cool mist vaporizer or humidifier in your home.  If your cough is worse at night, try using extra pillows to raise your head up higher while you sleep.  Rest as needed.   Keep all follow-up visits as told by your doctor. This is important. Contact a doctor if:  You have new symptoms.  You cough up pus.  Your cough does not get better after 2-3 weeks, or your cough gets worse.  Cough medicine does not help your cough and you are not sleeping well.  You have pain that gets worse or pain that is not helped with medicine.  You have a fever.  You are losing weight and you do not know why.  You have night sweats. Get help right away if:  You cough up blood.  You have trouble breathing.  Your heartbeat is very fast. These symptoms may be an emergency. Do not wait to see if the symptoms will go away. Get medical help right away. Call your local emergency services (911 in the [place] Do not drive yourself to the hospital. Summary  A cough helps to clear your throat and lungs. Many things can cause a cough.  Take over-the-counter and prescription medicines only as told by your doctor.  Always cover your mouth when you cough.  Contact a doctor if you have new symptoms or you have a cough that does not get better or gets worse. This information is not intended to replace advice given to you by your health care provider. Make sure you discuss any questions you have with your health care provider. Document Released: 03/10/2011 Document Revised: 07/16/2018 Document Reviewed: 07/16/2018 Elsevier Patient Education  2020 Elsevier  Inc.  

## 2019-02-13 NOTE — Progress Notes (Signed)
This service is provided via telemedicine  No vital signs collected/recorded due to the encounter was a telemedicine visit.   Location of patient (ex: home, work):  Home   Patient consents to a telephone visit: Yes  Location of the provider (ex: office, home): [facility], Office   Name of any referring provider:  N/A  Names of all persons participating in the telemedicine service and their role in the encounter: S.Chrae B/CMA, Abbey ChattersJessica Kailen Name, NP, Mr.Marting (husband) and Patient   Time spent on call: 4 min with medical assistant      Careteam: Patient Care Team: Sharon SellerEubanks, Shawndell Schillaci K, NP as PCP - General (Geriatric Medicine) Jodelle Redhristopher, Bridgette, MD as PCP - Cardiology (Cardiology)  Advanced Directive information    Allergies  Allergen Reactions  . Lisinopril Other (See Comments)  . Losartan Other (See Comments)  . Memantine Other (See Comments)    combative  . Peanut-Containing Drug Products   . Tramadol Hives    Chief Complaint  Patient presents with  . Acute Visit    Cough x 2 days. Telephone visit      HPI: Patient is a 78 y.o. female due to dry cough for several days. Pt is a poor historian due to dementia. Husband providing information. Has had trouble with bronchitis in the past.  Had cough for 3 days. Dry, non-productive.  No congestion at this time.  No fever or chills.  No shortness of breath.  Husband reports he got robitussin which has been effective but only last a few hours.  No hx of allergies or allergic rhinitis.  Slightly runny nose at this time.  No sick contacts  Does not feel sick, no decrease of energy or fatigue/malaise    Review of Systems:  Review of Systems  Constitutional: Negative for chills, fever and malaise/fatigue.  HENT: Negative for congestion.   Eyes: Negative for blurred vision, pain, discharge and redness.  Respiratory: Positive for cough. Negative for sputum production, shortness of breath and wheezing.    Endo/Heme/Allergies: Negative for environmental allergies.    Past Medical History:  Diagnosis Date  . Gallbladder disorder 2009   Per New Patient Packet   . Heart disease   . High blood pressure    Per New Patient Packet   . High cholesterol    Past Surgical History:  Procedure Laterality Date  . ABDOMINAL HYSTERECTOMY     Per New Patient Packet   . CARDIAC SURGERY  2011   Open Heart Surgey, Per New Patient Packet    . REPLACEMENT TOTAL KNEE Left 2010   Per New Patient Packet    Social History:   reports that she quit smoking about 31 years ago. She has never used smokeless tobacco. She reports previous drug use. She reports that she does not drink alcohol.  Family History  Problem Relation Age of Onset  . Dementia Mother   . Arthritis Mother   . Heart attack Father   . Arthritis Father   . Heart attack Sister   . Arthritis Sister     Medications: Patient's Medications  New Prescriptions   No medications on file  Previous Medications   ACETAMINOPHEN (TYLENOL) 325 MG TABLET    Take 650 mg by mouth as needed.   ATORVASTATIN (LIPITOR) 40 MG TABLET    TAKE 1 TABLET BY MOUTH EVERY DAY   CITALOPRAM (CELEXA) 20 MG TABLET    TAKE 1 TABLET BY MOUTH EVERY DAY   DICLOFENAC SODIUM (VOLTAREN) 1 %  GEL    Apply 4 g topically 4 (four) times daily. To both knees. May apply 2 gm to hands QID for arthritis   DONEPEZIL (ARICEPT) 10 MG TABLET    Take 1 tablet (10 mg total) by mouth at bedtime.   FUROSEMIDE (LASIX) 20 MG TABLET    TAKE 1 TABLET BY MOUTH EVERY DAY AS NEEDED   TRAZODONE (DESYREL) 50 MG TABLET    Take 0.5 tablets (25 mg total) by mouth 2 (two) times daily.  Modified Medications   No medications on file  Discontinued Medications   MULTIPLE VITAMINS-MINERALS (CENTRUM PO)    Take by mouth daily.    Physical Exam:  There were no vitals filed for this visit. There is no height or weight on file to calculate BMI. Wt Readings from Last 3 Encounters:  01/14/19 235 lb  (106.6 kg)  05/23/18 232 lb (105.2 kg)  03/29/18 231 lb (104.8 kg)     Labs reviewed: Basic Metabolic Panel: Recent Labs    05/21/18 0900 11/19/18 0906  NA 140 140  K 3.5 3.5  CL 105 107  CO2 27 28  GLUCOSE 83 91  BUN 10 10  CREATININE 0.91 0.97*  CALCIUM 9.1 9.2   Liver Function Tests: Recent Labs    05/21/18 0900 11/19/18 0906  AST 14 13  ALT 9 9  BILITOT 0.5 0.6  PROT 6.7 6.7   No results for input(s): LIPASE, AMYLASE in the last 8760 hours. No results for input(s): AMMONIA in the last 8760 hours. CBC: Recent Labs    05/21/18 0900 11/19/18 0906 01/14/19 1524  WBC 3.3* 4.3 4.8  NEUTROABS 1,284* 2,223 2,482  HGB 13.3 13.4 13.3  HCT 41.1 41.1 40.0  MCV 82.9 83.9 83.7  PLT 196 142 158   Lipid Panel: Recent Labs    05/21/18 0900 11/19/18 0906  CHOL 155 174  HDL 54 61  LDLCALC 88 100*  TRIG 52 52  CHOLHDL 2.9 2.9   TSH: No results for input(s): TSH in the last 8760 hours. A1C: No results found for: HGBA1C   Assessment/Plan 1. Cough Cough for 3 days without other symptoms. Hx of bronchitis but currently without chest or nasal congestion, fever, shortness of breath.  -can use delsym 5 cc every 12 hours as needed for cough - if congestion occurs to stop delsym and start mucinex DM by mouth twice daily with full glass of water -to increase fluid intake   Follow up precautions given. Janene HarveyJessica K. Biagio BorgEubanks, AGNP  [facility] [phone]    Virtual Visit via Telephone Note  I connected with pt and husband on 02/13/19 at 10:00 AM EDT by telephone and verified that I am speaking with the correct person using two identifiers.  Location: Patient: home Provider: office   I discussed the limitations, risks, security and privacy concerns of performing an evaluation and management service by telephone and the availability of in person appointments. I also discussed with the patient that there may be a patient responsible  charge related to this service. The patient expressed understanding and agreed to proceed.   I discussed the assessment and treatment plan with the patient. The patient was provided an opportunity to ask questions and all were answered. The patient agreed with the plan and demonstrated an understanding of the instructions.   The patient was advised to call back or seek an in-person evaluation if the symptoms worsen or if the condition fails to improve as anticipated.  I provided 9 minutes of non-face-to-face time during this encounter.  Carlos American. Harle Battiest Avs printed and mailed

## 2019-03-09 ENCOUNTER — Other Ambulatory Visit: Payer: Self-pay | Admitting: Nurse Practitioner

## 2019-03-15 ENCOUNTER — Telehealth: Payer: Self-pay | Admitting: *Deleted

## 2019-03-15 ENCOUNTER — Ambulatory Visit
Admission: RE | Admit: 2019-03-15 | Discharge: 2019-03-15 | Disposition: A | Discharge status: Home / Self Care | Payer: Medicare Other | Source: Ambulatory Visit | Attending: Nurse Practitioner | Admitting: Nurse Practitioner

## 2019-03-15 ENCOUNTER — Other Ambulatory Visit: Payer: Self-pay

## 2019-03-15 DIAGNOSIS — Z1231 Encounter for screening mammogram for malignant neoplasm of breast: Secondary | ICD-10-CM

## 2019-03-15 NOTE — Telephone Encounter (Signed)
Patient dropped off Handicap Placard. Needs filled out. Has self addressed envelope attached to be mailed back after signature.  Placed in [facility] folder to review and sign.

## 2019-04-09 ENCOUNTER — Other Ambulatory Visit: Payer: Medicare Other

## 2019-04-24 ENCOUNTER — Encounter: Payer: Self-pay | Admitting: Nurse Practitioner

## 2019-04-24 ENCOUNTER — Ambulatory Visit (INDEPENDENT_AMBULATORY_CARE_PROVIDER_SITE_OTHER): Payer: Medicare Other | Admitting: Nurse Practitioner

## 2019-04-24 ENCOUNTER — Other Ambulatory Visit: Payer: Self-pay

## 2019-04-24 VITALS — BP 150/84 | HR 59 | Temp 97.7°F | Resp 20 | Ht 64.0 in | Wt 229.0 lb

## 2019-04-24 DIAGNOSIS — Z23 Encounter for immunization: Secondary | ICD-10-CM | POA: Diagnosis not present

## 2019-04-24 DIAGNOSIS — G4700 Insomnia, unspecified: Secondary | ICD-10-CM | POA: Diagnosis not present

## 2019-04-24 DIAGNOSIS — M2540 Effusion, unspecified joint: Secondary | ICD-10-CM

## 2019-04-24 DIAGNOSIS — I10 Essential (primary) hypertension: Secondary | ICD-10-CM

## 2019-04-24 DIAGNOSIS — F419 Anxiety disorder, unspecified: Secondary | ICD-10-CM

## 2019-04-24 DIAGNOSIS — F0390 Unspecified dementia without behavioral disturbance: Secondary | ICD-10-CM | POA: Diagnosis not present

## 2019-04-24 DIAGNOSIS — E782 Mixed hyperlipidemia: Secondary | ICD-10-CM

## 2019-04-24 DIAGNOSIS — F329 Major depressive disorder, single episode, unspecified: Secondary | ICD-10-CM

## 2019-04-24 MED ORDER — DONEPEZIL HCL 10 MG PO TABS: ORAL_TABLET | ORAL | 1 refills | Status: DC

## 2019-04-24 MED ORDER — CITALOPRAM HYDROBROMIDE 20 MG PO TABS: 20.0000 mg | ORAL_TABLET | Freq: Every day | ORAL | 1 refills | Status: DC

## 2019-04-24 MED ORDER — TRAZODONE HCL 50 MG PO TABS: ORAL_TABLET | ORAL | 3 refills | Status: DC

## 2019-04-24 MED ORDER — ATORVASTATIN CALCIUM 40 MG PO TABS: 40.0000 mg | ORAL_TABLET | Freq: Every day | ORAL | 1 refills | Status: DC

## 2019-04-24 MED ORDER — FUROSEMIDE 20 MG PO TABS: 20.0000 mg | ORAL_TABLET | Freq: Every day | ORAL | 0 refills | Status: DC | PRN

## 2019-04-24 NOTE — Progress Notes (Signed)
Careteam: Patient Care Team: [name], NP as PCP - General (Geriatric Medicine) [name], MD as PCP - Cardiology (Cardiology)  Advanced Directive information    Allergies  Allergen Reactions  . Lisinopril Other (See Comments)  . Losartan Other (See Comments)  . Memantine Other (See Comments)    combative  . Peanut-Containing Drug Products   . Tramadol Hives    Chief Complaint  Patient presents with  . Medical Management of Chronic Issues    6 mo - aggression ans memory     HPI: Patient is a 78 y.o. female seen in the office today for routine follow up. Patient's daughter is with her for this visit and providing most of the hx due to dementia   OA- Chronic joint pain, reports she takes tylenol 500 mg tablet in the morning and in the evening, as well as voltaren gel on knees which she says helps with pain.  Hyperlipidemia- Currently on Lipitor 40 mg. Has lost 6 lb since last visit (01/14/19).  Depression/anxiety- Taking trazodone 25 mg by mouth in morning and at bedtime. Continues to take celexa 20 mg by mouth daily. Trazodone does help with sleep. Patient still has periods of increased agitation and anxiety. Daughter does not want to over-medicate her but "she is always on go." they are able to redirect her most of the time.  Dementia- Lives with husband. Does her own ADLs but requires a lot of supervision. Continues on Aricept 10 mg daily. Daughter concerned about increased agitation and anxiety, especially in afternoon/evening. Difficult to redirect, gets upset about old events that she has forgotten.   Hypertension- Has been controlled with lasix. 150/84 on recheck today. Daughter says she checks BP intermittently at home, systolic normally runs in 130s. Feels like elevated due to being in office.  Review of Systems:  Review of Systems  Constitutional: Negative for malaise/fatigue and weight loss.  HENT: Positive for hearing loss.    Respiratory: Negative for cough and shortness of breath.   Cardiovascular: Negative for chest pain and leg swelling.  Gastrointestinal: Negative for constipation, diarrhea, nausea and vomiting.  Musculoskeletal: Positive for joint pain (Chronic). Negative for falls.  Neurological: Negative for dizziness and headaches.  Endo/Heme/Allergies: Does not bruise/bleed easily.  Psychiatric/Behavioral: Positive for memory loss. The patient is nervous/anxious. The patient does not have insomnia.     Past Medical History:  Diagnosis Date  . Gallbladder disorder 2009   Per New Patient Packet   . Heart disease   . High blood pressure    Per New Patient Packet   . High cholesterol    Past Surgical History:  Procedure Laterality Date  . ABDOMINAL HYSTERECTOMY     Per New Patient Packet   . CARDIAC SURGERY  2011   Open Heart Surgey, Per New Patient Packet    . REPLACEMENT TOTAL KNEE Left 2010   Per New Patient Packet    Social History:   reports that she quit smoking about 31 years ago. She has never used smokeless tobacco. She reports previous drug use. She reports that she does not drink alcohol.  Family History  Problem Relation Age of Onset  . Dementia Mother   . Arthritis Mother   . Breast cancer Mother   . Heart attack Father   . Arthritis Father   . Heart attack Sister   . Arthritis Sister   . Breast cancer Sister     Medications: Patient's Medications  New Prescriptions   No  medications on file  Previous Medications   ACETAMINOPHEN (TYLENOL) 325 MG TABLET    Take 650 mg by mouth as needed.   ATORVASTATIN (LIPITOR) 40 MG TABLET    TAKE 1 TABLET BY MOUTH EVERY DAY   CITALOPRAM (CELEXA) 20 MG TABLET    TAKE 1 TABLET BY MOUTH EVERY DAY   DICLOFENAC SODIUM (VOLTAREN) 1 % GEL    Apply 4 g topically 4 (four) times daily. To both knees. May apply 2 gm to hands QID for arthritis   DONEPEZIL (ARICEPT) 10 MG TABLET    TAKE 1 TABLET BY MOUTH EVERYDAY AT BEDTIME   FUROSEMIDE (LASIX)  20 MG TABLET    TAKE 1 TABLET BY MOUTH EVERY DAY AS NEEDED   TRAZODONE (DESYREL) 50 MG TABLET    Take 0.5 tablets (25 mg total) by mouth 2 (two) times daily.  Modified Medications   No medications on file  Discontinued Medications   No medications on file    Physical Exam:  Vitals:   04/24/19 1340  BP: (!) 150/84  Pulse: (!) 59  Resp: 20  Temp: 97.7 F (36.5 C)  SpO2: 98%  Weight: 229 lb (103.9 kg)  Height: 5\' 4"  (1.626 m)   Body mass index is 39.31 kg/m. Wt Readings from Last 3 Encounters:  04/24/19 229 lb (103.9 kg)  01/14/19 235 lb (106.6 kg)  05/23/18 232 lb (105.2 kg)    Physical Exam Constitutional:      Appearance: Normal appearance. She is well-groomed. She is obese.  HENT:     Head: Normocephalic.  Neck:     Musculoskeletal: Normal range of motion and neck supple.  Cardiovascular:     Rate and Rhythm: Normal rate and regular rhythm.     Pulses: Normal pulses.  Pulmonary:     Effort: Pulmonary effort is normal.     Breath sounds: Normal breath sounds.  Abdominal:     General: Bowel sounds are normal.     Palpations: Abdomen is soft.     Tenderness: There is no abdominal tenderness.  Musculoskeletal:     Right lower leg: No edema.     Left lower leg: No edema.  Skin:    General: Skin is warm and dry.  Neurological:     Mental Status: She is alert.  Psychiatric:        Mood and Affect: Mood normal.        Speech: Speech normal.        Behavior: Behavior is cooperative.        Cognition and Memory: Memory is impaired.     Labs reviewed: Basic Metabolic Panel: Recent Labs    05/21/18 0900 11/19/18 0906  NA 140 140  K 3.5 3.5  CL 105 107  CO2 27 28  GLUCOSE 83 91  BUN 10 10  CREATININE 0.91 0.97*  CALCIUM 9.1 9.2   Liver Function Tests: Recent Labs    05/21/18 0900 11/19/18 0906  AST 14 13  ALT 9 9  BILITOT 0.5 0.6  PROT 6.7 6.7   No results for input(s): LIPASE, AMYLASE in the last 8760 hours. No results for input(s): AMMONIA  in the last 8760 hours. CBC: Recent Labs    05/21/18 0900 11/19/18 0906 01/14/19 1524  WBC 3.3* 4.3 4.8  NEUTROABS 1,284* 2,223 2,482  HGB 13.3 13.4 13.3  HCT 41.1 41.1 40.0  MCV 82.9 83.9 83.7  PLT 196 142 158   Lipid Panel: Recent Labs    05/21/18 0900  11/19/18 0906  CHOL 155 174  HDL 54 61  LDLCALC 88 100*  TRIG 52 52  CHOLHDL 2.9 2.9   TSH: No results for input(s): TSH in the last 8760 hours. A1C: No results found for: HGBA1C   Assessment/Plan 1. Mixed hyperlipidemia -LDL 100 (11/19/18) -Will recheck lipids with next visit (not fasting today and would prefer not to get labs) -Discussed limiting excess sweets, low fat diet - atorvastatin (LIPITOR) 40 MG tablet; Take 1 tablet (40 mg total) by mouth daily.  Dispense: 90 tablet; Refill: 1  2. Insomnia, unspecified type -Daughter feels like insomnia has improved,trazodone is helping. Patient does get up occasionally at night but goes back to sleep. No issues with over sedation or daytime drowsiness.  - traZODone (DESYREL) 50 MG tablet; Take 0.5 tablets (25 mg total) by mouth 2 (two) times daily.  Dispense: 90 tablet; Refill: 0  3. Anxiety and depression -Increased agitation and anxiety, especially in afternoon/evening. Patient gets agitated with husband, calls daughter multiple times at night upset over past issues she's forgotten about (passing of a friend, etc). Not easily redirected or distracted.  -Adult day center closed due to covid. Discussed increasing daytime activity, like exercise bike, and finding tasks for her to complete- folding clothes, etc.  -Increase trazodone to 50 mg in morning and 25 mg in evening to help with anxiety -encouraged schedule/routine. - traZODone (DESYREL) 50 MG tablet; Take 0.5 tablets (25 mg total) by mouth 2 (two) times daily.  Dispense: 90 tablet; Refill: 0  4. Need for immunization against influenza - Flu Vaccine QUAD High Dose(Fluad)  5. Swelling of joint of multiple sites  Stable on lasix - furosemide (LASIX) 20 MG tablet; Take 1 tablet (20 mg total) by mouth daily as needed.  Dispense: 90 tablet; Refill: 0  6. Essential hypertension -dietary modifications, goal <140/90.to continue to monitor blood pressure at home and notify if remains elevated.   7. Dementia  Moderate dementia, progressive decline noted and expect worsening with disease progression. Weight loss noted and this is most likely due to progressive dementia.  -encouraged routine and structure in activities.  -to keep pt active and mind stimulated, limited due to COVID. -could not tolerate namenda, continues on aricept 10 mg at bedtime.    Next appt: 4 months, will get blood work at this visit.  Janene HarveyJessica K. Biagio BorgEubanks, AGNP  I personally was present during the history, physical exam and medical decision-making activities of this service and have verified that the service and findings are accurately documented in the student's note  [facility] [phone]

## 2019-04-24 NOTE — Progress Notes (Deleted)
Careteam: Patient Care Team: Lauree Chandler, NP as PCP - General (Geriatric Medicine) Buford Dresser, MD as PCP - Cardiology (Cardiology)  Advanced Directive information    Allergies  Allergen Reactions  . Lisinopril Other (See Comments)  . Losartan Other (See Comments)  . Memantine Other (See Comments)    combative  . Peanut-Containing Drug Products   . Tramadol Hives    Chief Complaint  Patient presents with  . Medical Management of Chronic Issues    6 mo - aggression ans memory     HPI: Patient is a 78 y.o. female seen in the office today ***    Review of Systems:  ROS***  Past Medical History:  Diagnosis Date  . Gallbladder disorder 2009   Per New Patient Packet   . Heart disease   . High blood pressure    Per New Patient Packet   . High cholesterol    Past Surgical History:  Procedure Laterality Date  . ABDOMINAL HYSTERECTOMY     Per New Patient Packet   . CARDIAC SURGERY  2011   Open Heart Surgey, Per New Patient Packet    . REPLACEMENT TOTAL KNEE Left 2010   Per New Patient Packet    Social History:   reports that she quit smoking about 31 years ago. She has never used smokeless tobacco. She reports previous drug use. She reports that she does not drink alcohol.  Family History  Problem Relation Age of Onset  . Dementia Mother   . Arthritis Mother   . Breast cancer Mother   . Heart attack Father   . Arthritis Father   . Heart attack Sister   . Arthritis Sister   . Breast cancer Sister     Medications: Patient's Medications  New Prescriptions   No medications on file  Previous Medications   ACETAMINOPHEN (TYLENOL) 325 MG TABLET    Take 650 mg by mouth as needed.   ATORVASTATIN (LIPITOR) 40 MG TABLET    TAKE 1 TABLET BY MOUTH EVERY DAY   CITALOPRAM (CELEXA) 20 MG TABLET    TAKE 1 TABLET BY MOUTH EVERY DAY   DICLOFENAC SODIUM (VOLTAREN) 1 % GEL    Apply 4 g topically 4 (four) times daily. To both knees. May apply 2 gm to  hands QID for arthritis   DONEPEZIL (ARICEPT) 10 MG TABLET    TAKE 1 TABLET BY MOUTH EVERYDAY AT BEDTIME   FUROSEMIDE (LASIX) 20 MG TABLET    TAKE 1 TABLET BY MOUTH EVERY DAY AS NEEDED   TRAZODONE (DESYREL) 50 MG TABLET    Take 0.5 tablets (25 mg total) by mouth 2 (two) times daily.  Modified Medications   No medications on file  Discontinued Medications   No medications on file    Physical Exam:  Vitals:   04/24/19 1340  BP: (!) 150/84  Pulse: (!) 59  Resp: 20  Temp: 97.7 F (36.5 C)  SpO2: 98%  Weight: 229 lb (103.9 kg)  Height: 5\' 4"  (1.626 m)   Body mass index is 39.31 kg/m. Wt Readings from Last 3 Encounters:  04/24/19 229 lb (103.9 kg)  01/14/19 235 lb (106.6 kg)  05/23/18 232 lb (105.2 kg)    Physical Exam***  Labs reviewed: Basic Metabolic Panel: Recent Labs    05/21/18 0900 11/19/18 0906  NA 140 140  K 3.5 3.5  CL 105 107  CO2 27 28  GLUCOSE 83 91  BUN 10 10  CREATININE 0.91 0.97*  CALCIUM 9.1 9.2   Liver Function Tests: Recent Labs    05/21/18 0900 11/19/18 0906  AST 14 13  ALT 9 9  BILITOT 0.5 0.6  PROT 6.7 6.7   No results for input(s): LIPASE, AMYLASE in the last 8760 hours. No results for input(s): AMMONIA in the last 8760 hours. CBC: Recent Labs    05/21/18 0900 11/19/18 0906 01/14/19 1524  WBC 3.3* 4.3 4.8  NEUTROABS 1,284* 2,223 2,482  HGB 13.3 13.4 13.3  HCT 41.1 41.1 40.0  MCV 82.9 83.9 83.7  PLT 196 142 158   Lipid Panel: Recent Labs    05/21/18 0900 11/19/18 0906  CHOL 155 174  HDL 54 61  LDLCALC 88 100*  TRIG 52 52  CHOLHDL 2.9 2.9   TSH: No results for input(s): TSH in the last 8760 hours. A1C: No results found for: HGBA1C   Assessment/Plan 1. Mixed hyperlipidemia *** - atorvastatin (LIPITOR) 40 MG tablet; Take 1 tablet (40 mg total) by mouth daily.  Dispense: 90 tablet; Refill: 1  2. Insomnia, unspecified type *** - traZODone (DESYREL) 50 MG tablet; Take 0.5 tablets (25 mg total) by mouth 2 (two)  times daily.  Dispense: 90 tablet; Refill: 0  3. Anxiety and depression *** - traZODone (DESYREL) 50 MG tablet; Take 0.5 tablets (25 mg total) by mouth 2 (two) times daily.  Dispense: 90 tablet; Refill: 0  4. Need for immunization against influenza *** - Flu Vaccine QUAD High Dose(Fluad)   Next appt: *** Leahann Lempke K. Biagio Borg  [facility] [phone]

## 2019-04-24 NOTE — Patient Instructions (Signed)
Increase trazodone to 1 tablet in the morning and 1/2 tablet at bedtime for mood.   Plan activities/exercise to keep busy.   Structure and routine is important.

## 2019-05-23 ENCOUNTER — Ambulatory Visit: Payer: Medicare Other | Admitting: Family

## 2019-05-23 ENCOUNTER — Ambulatory Visit: Payer: Medicare Other | Admitting: Nurse Practitioner

## 2019-06-12 ENCOUNTER — Telehealth: Payer: Self-pay | Admitting: *Deleted

## 2019-06-12 DIAGNOSIS — Q845 Enlarged and hypertrophic nails: Secondary | ICD-10-CM

## 2019-06-12 NOTE — Telephone Encounter (Signed)
Referral placed.

## 2019-06-12 NOTE — Telephone Encounter (Signed)
Patient called and stated that at last OV it was discussed getting a referral to a Podiatrist to have toenails cut but it was never followed through with. Patient is wanting the referral now. Please Advise.

## 2019-06-18 ENCOUNTER — Other Ambulatory Visit: Payer: Self-pay | Admitting: Nurse Practitioner

## 2019-06-18 DIAGNOSIS — F419 Anxiety disorder, unspecified: Secondary | ICD-10-CM

## 2019-06-18 DIAGNOSIS — G4700 Insomnia, unspecified: Secondary | ICD-10-CM

## 2019-06-18 NOTE — Telephone Encounter (Signed)
Pending medication refill due to high allergy / contraindication warning. Routing to provider for approval.

## 2019-07-08 ENCOUNTER — Ambulatory Visit: Payer: Medicare Other | Admitting: Podiatry

## 2019-07-08 ENCOUNTER — Other Ambulatory Visit: Payer: Self-pay

## 2019-07-08 DIAGNOSIS — B351 Tinea unguium: Secondary | ICD-10-CM

## 2019-07-08 DIAGNOSIS — L84 Corns and callosities: Secondary | ICD-10-CM | POA: Diagnosis not present

## 2019-07-08 DIAGNOSIS — N183 Chronic kidney disease, stage 3 unspecified: Secondary | ICD-10-CM | POA: Diagnosis not present

## 2019-07-08 NOTE — Progress Notes (Signed)
Subjective:  Patient ID: Laurie Mays, female    DOB: [birth year],  MRN: [id number]  Chief Complaint  Patient presents with  . debride    routine foot caer   78 y.o. female presents for concern of thick nails and calluses. Denies DM. Does have CKD Stage III per records.  Review of Systems: Negative except as noted in the HPI. Denies N/V/F/Ch.  Past Medical History:  Diagnosis Date  . Gallbladder disorder 2009   Per New Patient Packet   . Heart disease   . High blood pressure    Per New Patient Packet   . High cholesterol     Current Outpatient Medications:  .  acetaminophen (TYLENOL) 325 MG tablet, Take 650 mg by mouth as needed., Disp: , Rfl:  .  atorvastatin (LIPITOR) 40 MG tablet, Take 1 tablet (40 mg total) by mouth daily., Disp: 90 tablet, Rfl: 1 .  citalopram (CELEXA) 20 MG tablet, Take 1 tablet (20 mg total) by mouth daily., Disp: 90 tablet, Rfl: 1 .  diclofenac sodium (VOLTAREN) 1 % GEL, Apply 4 g topically 4 (four) times daily. To both knees. May apply 2 gm to hands QID for arthritis, Disp: 100 g, Rfl: 6 .  donepezil (ARICEPT) 10 MG tablet, TAKE 1 TABLET BY MOUTH EVERYDAY AT BEDTIME, Disp: 90 tablet, Rfl: 1 .  furosemide (LASIX) 20 MG tablet, Take 1 tablet (20 mg total) by mouth daily as needed., Disp: 90 tablet, Rfl: 0 .  traZODone (DESYREL) 50 MG tablet, TAKE 1 TABLET BY MOUTH EVERY DAY IN THE MORNING AND 1/2 TABLET IN THE EVENING, Disp: 135 tablet, Rfl: 1  Social History   Tobacco Use  Smoking Status Former Smoker  . Quit date: 07/12/1987  . Years since quitting: 32.0  Smokeless Tobacco Never Used  Tobacco Comment   Quit about 30 years ago as of 2019     Allergies  Allergen Reactions  . Lisinopril Other (See Comments)  . Losartan Other (See Comments)  . Memantine Other (See Comments)    combative  . Peanut-Containing Drug Products   . Tramadol Hives   Objective:  There were no vitals filed for this visit. There is no height or weight on file to  calculate BMI. Constitutional Well developed. Well nourished.  Vascular Dorsalis pedis pulses present 1+ bilaterally  Posterior tibial pulses present 1+ bilaterally  Pedal hair growth diminished. Capillary refill normal to all digits.  No cyanosis or clubbing noted.  Neurologic Normal speech. Oriented to person, place, and time. Epicritic sensation to light touch grossly present bilaterally.  Dermatologic Nails elongated, thickened, dystrophic. No open wounds. HPJ submet 5 bilat.  Orthopedic: Normal joint ROM without pain or crepitus bilaterally. No visible deformities. No bony tenderness.   Assessment:   1. Stage 3 chronic kidney disease, unspecified whether stage 3a or 3b CKD   2. Onychomycosis   3. Callus    Plan:  Patient was evaluated and treated and all questions answered.  CDK3, Onychomycosis -Educated on diabetic footcare. -Nails x10 debrided sharply and manually with large nail nipper and rotary burr.   Procedure: Nail Debridement Rationale: Patient meets criteria for routine foot care due to CKD III Type of Debridement: manual, sharp debridement. Instrumentation: Nail nipper, rotary burr. Number of Nails: 10   Procedure: Paring of Lesion Rationale: painful hyperkeratotic lesion Type of Debridement: manual, sharp debridement. Instrumentation: 312 blade Number of Lesions: 2   Return in about 3 months (around 10/06/2019) for At Risk Foot Care -  CKD Stage III.

## 2019-08-16 ENCOUNTER — Other Ambulatory Visit: Payer: Self-pay

## 2019-08-16 ENCOUNTER — Encounter: Payer: Self-pay | Admitting: Family

## 2019-08-16 ENCOUNTER — Ambulatory Visit (INDEPENDENT_AMBULATORY_CARE_PROVIDER_SITE_OTHER): Payer: Medicare PPO | Admitting: Family

## 2019-08-16 DIAGNOSIS — Z20822 Contact with and (suspected) exposure to covid-19: Secondary | ICD-10-CM

## 2019-08-16 DIAGNOSIS — R058 Other specified cough: Secondary | ICD-10-CM

## 2019-08-16 DIAGNOSIS — R05 Cough: Secondary | ICD-10-CM | POA: Diagnosis not present

## 2019-08-16 MED ORDER — ZINC GLUCONATE 50 MG PO TABS: 50.0000 mg | ORAL_TABLET | Freq: Every day | ORAL | 0 refills | Status: AC

## 2019-08-16 MED ORDER — VITAMIN D 25 MCG (1000 UNIT) PO TABS: 1000.0000 [IU] | ORAL_TABLET | Freq: Every day | ORAL | 3 refills | Status: DC

## 2019-08-16 MED ORDER — VITAMIN C 1000 MG PO TABS: 1000.0000 mg | ORAL_TABLET | Freq: Every day | ORAL | 3 refills | Status: DC

## 2019-08-16 NOTE — Patient Instructions (Signed)
- continue on OTC cough syrup as needed for cough. - Advised to increase Fluid intake or soup.  - Please get COVID-19 testing at [facility] [address] or desired place.book appointment through MyChart.Your Husband states your daughter will assist you with booking appointment.  - Get plenty of rest  - Start on Vitamin C,Vit D and Zinc as below:  - Ascorbic Acid (VITAMIN C) 1000 MG tablet; Take 1 tablet (1,000 mg total) by mouth daily.  Dispense: 30 tablet; Refill: 3  - cholecalciferol (VITAMIN D3) 25 MCG (1000 UNIT) tablet; Take 1 tablet (1,000 Units total) by mouth daily.  Dispense: 30 tablet; Refill: 3  - zinc gluconate 50 MG tablet; Take 1 tablet (50 mg total) by mouth daily for 14 days.  Dispense: 14 tablet; Refill: 0 - continue to wear facial mask,social distance and practice hand hygiene per CDC guidelines:  COVID-19 COVID-19 is a respiratory infection that is caused by a virus called severe acute respiratory syndrome coronavirus 2 (SARS-CoV-2). The disease is also known as coronavirus disease or novel coronavirus. In some people, the virus may not cause any symptoms. In others, it may cause a serious infection. The infection can get worse quickly and can lead to complications, such as:  Pneumonia, or infection of the lungs.  Acute respiratory distress syndrome or ARDS. This is a condition in which fluid build-up in the lungs prevents the lungs from filling with air and passing oxygen into the blood.  Acute respiratory failure. This is a condition in which there is not enough oxygen passing from the lungs to the body or when carbon dioxide is not passing from the lungs out of the body.  Sepsis or septic shock. This is a serious bodily reaction to an infection.  Blood clotting problems.  Secondary infections due to bacteria or fungus.  Organ failure. This is when your body's organs stop working. The virus that causes COVID-19 is contagious. This means that it can spread from person to person  through droplets from coughs and sneezes (respiratory secretions). What are the causes? This illness is caused by a virus. You may catch the virus by:  Breathing in droplets from an infected person. Droplets can be spread by a person breathing, speaking, singing, coughing, or sneezing.  Touching something, like a table or a doorknob, that was exposed to the virus (contaminated) and then touching your mouth, nose, or eyes. What increases the risk? Risk for infection You are more likely to be infected with this virus if you:  Are within 6 feet (2 meters) of a person with COVID-19.  Provide care for or live with a person who is infected with COVID-19.  Spend time in crowded indoor spaces or live in shared housing. Risk for serious illness You are more likely to become seriously ill from the virus if you:  Are 2 years of age or older. The higher your age, the more you are at risk for serious illness.  Live in a nursing home or long-term care facility.  Have cancer.  Have a long-term (chronic) disease such as: ? Chronic lung disease, including chronic obstructive pulmonary disease or asthma. ? A long-term disease that lowers your body's ability to fight infection (immunocompromised). ? Heart disease, including heart failure, a condition in which the arteries that lead to the heart become narrow or blocked (coronary artery disease), a disease which makes the heart muscle thick, weak, or stiff (cardiomyopathy). ? Diabetes. ? Chronic kidney disease. ? Sickle cell disease, a condition in which  red blood cells have an abnormal "sickle" shape. ? Liver disease.  Are obese. What are the signs or symptoms? Symptoms of this condition can range from mild to severe. Symptoms may appear any time from 2 to 14 days after being exposed to the virus. They include:  A fever or chills.  A cough.  Difficulty breathing.  Headaches, body aches, or muscle aches.  Runny or stuffy (congested)  nose.  A sore throat.  New loss of taste or smell. Some people may also have stomach problems, such as nausea, vomiting, or diarrhea. Other people may not have any symptoms of COVID-19. How is this diagnosed? This condition may be diagnosed based on:  Your signs and symptoms, especially if: ? You live in an area with a COVID-19 outbreak. ? You recently traveled to or from an area where the virus is common. ? You provide care for or live with a person who was diagnosed with COVID-19. ? You were exposed to a person who was diagnosed with COVID-19.  A physical exam.  Lab tests, which may include: ? Taking a sample of fluid from the back of your nose and throat (nasopharyngeal fluid), your nose, or your throat using a swab. ? A sample of mucus from your lungs (sputum). ? Blood tests.  Imaging tests, which may include, X-rays, CT scan, or ultrasound. How is this treated? At present, there is no medicine to treat COVID-19. Medicines that treat other diseases are being used on a trial basis to see if they are effective against COVID-19. Your health care provider will talk with you about ways to treat your symptoms. For most people, the infection is mild and can be managed at home with rest, fluids, and over-the-counter medicines. Treatment for a serious infection usually takes places in a hospital intensive care unit (ICU). It may include one or more of the following treatments. These treatments are given until your symptoms improve.  Receiving fluids and medicines through an IV.  Supplemental oxygen. Extra oxygen is given through a tube in the nose, a face mask, or a hood.  Positioning you to lie on your stomach (prone position). This makes it easier for oxygen to get into the lungs.  Continuous positive airway pressure (CPAP) or bi-level positive airway pressure (BPAP) machine. This treatment uses mild air pressure to keep the airways open. A tube that is connected to a motor delivers  oxygen to the body.  Ventilator. This treatment moves air into and out of the lungs by using a tube that is placed in your windpipe.  Tracheostomy. This is a procedure to create a hole in the neck so that a breathing tube can be inserted.  Extracorporeal membrane oxygenation (ECMO). This procedure gives the lungs a chance to recover by taking over the functions of the heart and lungs. It supplies oxygen to the body and removes carbon dioxide. Follow these instructions at home: Lifestyle  If you are sick, stay home except to get medical care. Your health care provider will tell you how long to stay home. Call your health care provider before you go for medical care.  Rest at home as told by your health care provider.  Do not use any products that contain nicotine or tobacco, such as cigarettes, e-cigarettes, and chewing tobacco. If you need help quitting, ask your health care provider.  Return to your normal activities as told by your health care provider. Ask your health care provider what activities are safe for you. General instructions  Take over-the-counter and prescription medicines only as told by your health care provider.  Drink enough fluid to keep your urine pale yellow.  Keep all follow-up visits as told by your health care provider. This is important. How is this prevented?  There is no vaccine to help prevent COVID-19 infection. However, there are steps you can take to protect yourself and others from this virus. To protect yourself:   Do not travel to areas where COVID-19 is a risk. The areas where COVID-19 is reported change often. To identify high-risk areas and travel restrictions, check the CDC travel website: FatFares.com.br  If you live in, or must travel to, an area where COVID-19 is a risk, take precautions to avoid infection. ? Stay away from people who are sick. ? Wash your hands often with soap and water for 20 seconds. If soap and water are not  available, use an alcohol-based hand sanitizer. ? Avoid touching your mouth, face, eyes, or nose. ? Avoid going out in public, follow guidance from your state and local health authorities. ? If you must go out in public, wear a cloth face covering or face mask. Make sure your mask covers your nose and mouth. ? Avoid crowded indoor spaces. Stay at least 6 feet (2 meters) away from others. ? Disinfect objects and surfaces that are frequently touched every day. This may include:  Counters and tables.  Doorknobs and light switches.  Sinks and faucets.  Electronics, such as phones, remote controls, keyboards, computers, and tablets. To protect others: If you have symptoms of COVID-19, take steps to prevent the virus from spreading to others.  If you think you have a COVID-19 infection, contact your health care provider right away. Tell your health care team that you think you may have a COVID-19 infection.  Stay home. Leave your house only to seek medical care. Do not use public transport.  Do not travel while you are sick.  Wash your hands often with soap and water for 20 seconds. If soap and water are not available, use alcohol-based hand sanitizer.  Stay away from other members of your household. Let healthy household members care for children and pets, if possible. If you have to care for children or pets, wash your hands often and wear a mask. If possible, stay in your own room, separate from others. Use a different bathroom.  Make sure that all people in your household wash their hands well and often.  Cough or sneeze into a tissue or your sleeve or elbow. Do not cough or sneeze into your hand or into the air.  Wear a cloth face covering or face mask. Make sure your mask covers your nose and mouth. Where to find more information  Centers for Disease Control and Prevention: PurpleGadgets.be  World Health Organization:  https://www.castaneda.info/ Contact a health care provider if:  You live in or have traveled to an area where COVID-19 is a risk and you have symptoms of the infection.  You have had contact with someone who has COVID-19 and you have symptoms of the infection. Get help right away if:  You have trouble breathing.  You have pain or pressure in your chest.  You have confusion.  You have bluish lips and fingernails.  You have difficulty waking from sleep.  You have symptoms that get worse. These symptoms may represent a serious problem that is an emergency. Do not wait to see if the symptoms will go away. Get medical help right away. Call your  local emergency services (911 in the [place] Do not drive yourself to the hospital. Let the emergency medical personnel know if you think you have COVID-19. Summary  COVID-19 is a respiratory infection that is caused by a virus. It is also known as coronavirus disease or novel coronavirus. It can cause serious infections, such as pneumonia, acute respiratory distress syndrome, acute respiratory failure, or sepsis.  The virus that causes COVID-19 is contagious. This means that it can spread from person to person through droplets from breathing, speaking, singing, coughing, or sneezing.  You are more likely to develop a serious illness if you are 68 years of age or older, have a weak immune system, live in a nursing home, or have chronic disease.  There is no medicine to treat COVID-19. Your health care provider will talk with you about ways to treat your symptoms.  Take steps to protect yourself and others from infection. Wash your hands often and disinfect objects and surfaces that are frequently touched every day. Stay away from people who are sick and wear a mask if you are sick. This information is not intended to replace advice given to you by your health care provider. Make sure you discuss any questions you have with your health care  provider. Document Revised: 04/26/2019 Document Reviewed: 08/02/2018 Elsevier Patient Education  2020 [employer].

## 2019-08-16 NOTE — Progress Notes (Signed)
Patient ID: Laurie Mays, female   DOB: Jun 21, 1941, 79 y.o.   MRN: [id number] This service is provided via telemedicine  No vital signs collected/recorded due to the encounter was a telemedicine visit.   Location of patient (ex: home, work):  HOME  Patient consents to a telephone visit:  YES  Location of the provider (ex: office, home):  OFFICE  Name of any referring provider:  Sherrie Mustache, NP  Names of all persons participating in the telemedicine service and their role in the encounter:  PATIENT, Laurie Mays, Laurie Mays, Laurie Mays, Laurie Mays. NP  Time spent on call:  3:48    Provider: Lizandro Spellman FNP-C  Laurie Chandler, NP  Patient Care Team: Laurie Chandler, NP as PCP - General (Geriatric Medicine) Laurie Dresser, MD as PCP - Cardiology (Cardiology)  Extended Emergency Contact Information Primary Emergency Contact: Laurie Mays Address: [address] [place], [place] [address] Laurie Mays of [place] Phone: [phone] Relation: Spouse Secondary Emergency Contact: Laurie Mays Home Phone: [phone] Work Phone: [phone] Mobile Phone: [phone] Relation: Daughter  Code Status: Full Code  Goals of care: Advanced Directive information Advanced Directives 01/28/2019  Does Patient Have a Medical Advance Directive? Yes  Type of Advance Directive [place]  Does patient want to make changes to medical advance directive? No - Guardian declined  Copy of Ashley in Chart? Yes - validated most recent copy scanned in chart (See row information)     Chief Complaint  Patient presents with  . Acute Visit    COVID QUESTIONS    HPI:  Pt is a 79 y.o. female seen today for an acute visit for evaluation of Cough x 2 days.she states her daughter was diagnosed with COVID-19.Daughter lives next door to her.she was in contact with daughter prior to positive COVID-19 test.she describes cough as  non-productive.Has had runny nose.she denies any body aches, fever,chills,loss of taste ,smell,nuasea,vomiting or diarrhea. Also denies any chest tightness,chest pain,shortness of breath or wheezing.    Past Medical History:  Diagnosis Date  . Gallbladder disorder 2009   Per New Patient Packet   . Heart disease   . High blood pressure    Per New Patient Packet   . High cholesterol    Past Surgical History:  Procedure Laterality Date  . ABDOMINAL HYSTERECTOMY     Per New Patient Packet   . CARDIAC SURGERY  2011   Open Heart Surgey, Per New Patient Packet    . REPLACEMENT TOTAL KNEE Left 2010   Per New Patient Packet     Allergies  Allergen Reactions  . Lisinopril Other (See Comments)  . Losartan Other (See Comments)  . Memantine Other (See Comments)    combative  . Peanut-Containing Drug Products   . Tramadol Hives    Outpatient Encounter Medications as of 08/16/2019  Medication Sig  . acetaminophen (TYLENOL) 325 MG tablet Take 650 mg by mouth as needed.  Marland Kitchen atorvastatin (LIPITOR) 40 MG tablet Take 1 tablet (40 mg total) by mouth daily.  . citalopram (CELEXA) 20 MG tablet Take 1 tablet (20 mg total) by mouth daily.  . diclofenac sodium (VOLTAREN) 1 % GEL Apply 4 g topically 4 (four) times daily. To both knees. May apply 2 gm to hands QID for arthritis  . donepezil (ARICEPT) 10 MG tablet TAKE 1 TABLET BY MOUTH EVERYDAY AT BEDTIME  . furosemide (LASIX) 20 MG tablet Take 1 tablet (20  mg total) by mouth daily as needed.  . traZODone (DESYREL) 50 MG tablet TAKE 1 TABLET BY MOUTH EVERY DAY IN THE MORNING AND 1/2 TABLET IN THE EVENING   No facility-administered encounter medications on file as of 08/16/2019.    Review of Systems  Constitutional: Positive for appetite change. Negative for chills, fatigue and fever.  HENT: Positive for hearing loss and rhinorrhea. Negative for congestion, sinus pressure, sinus pain, sneezing, sore throat and trouble swallowing.   Eyes: Negative for  discharge, redness and itching.  Respiratory: Positive for cough. Negative for chest tightness, shortness of breath and wheezing.   Cardiovascular: Negative for chest pain, palpitations and leg swelling.  Gastrointestinal: Negative for abdominal distention, abdominal pain, constipation, diarrhea, nausea and vomiting.  Genitourinary: Negative for decreased urine volume, difficulty urinating, dysuria, flank pain, frequency and urgency.  Musculoskeletal: Negative for myalgias.    Immunization History  Administered Date(s) Administered  . Fluad Quad(high Dose 65+) 04/24/2019  . Influenza, High Dose Seasonal PF 07/11/1968, 05/25/2017, 05/23/2018  . Pneumococcal Conjugate-13 01/19/2018  . Tdap 07/11/1988   Pertinent  Health Maintenance Due  Topic Date Due  . DEXA SCAN  07/28/2003  . PNA vac Low Risk Adult (2 of 2 - PPSV23) 01/20/2019  . INFLUENZA VACCINE  Completed   Fall Risk  08/16/2019 01/28/2019 01/14/2019 11/21/2018 05/23/2018  Falls in the past year? 0 0 0 0 0  Number falls in past yr: 0 0 0 0 -  Injury with Fall? 0 0 0 0 -   There were no vitals filed for this visit. There is no height or weight on file to calculate BMI. Physical Exam  Unable to complete on Telephone visit.   Labs reviewed: Recent Labs    11/19/18 0906  NA 140  K 3.5  CL 107  CO2 28  GLUCOSE 91  BUN 10  CREATININE 0.97*  CALCIUM 9.2   Recent Labs    11/19/18 0906  AST 13  ALT 9  BILITOT 0.6  PROT 6.7   Recent Labs    11/19/18 0906 01/14/19 1524  WBC 4.3 4.8  NEUTROABS 2,223 2,482  HGB 13.4 13.3  HCT 41.1 40.0  MCV 83.9 83.7  PLT 142 158   Lab Results  Component Value Date   TSH 1.11 11/01/2017   No results found for: HGBA1C Lab Results  Component Value Date   CHOL 174 11/19/2018   HDL 61 11/19/2018   LDLCALC 100 (H) 11/19/2018   TRIG 52 11/19/2018   CHOLHDL 2.9 11/19/2018    Significant Diagnostic Results in last 30 days:  No results found.  Assessment/Plan  Cough with  exposure to COVID-19 virus No fever or chills reported.Exposure to daughter positive for COVID-19.  - continue on OTC cough syrup as needed for cough. - Advised to increase Fluid intake or soup.  - Please get COVID-19 testing at [facility] [address] or desired place.Husband states daughter will book appointment for patient through MyChart. - Get plenty of rest  - Start on Vitamin C,Vit D and Zinc as below:  - Ascorbic Acid (VITAMIN C) 1000 MG tablet; Take 1 tablet (1,000 mg total) by mouth daily.  Dispense: 30 tablet; Refill: 3  - cholecalciferol (VITAMIN D3) 25 MCG (1000 UNIT) tablet; Take 1 tablet (1,000 Units total) by mouth daily.  Dispense: 30 tablet; Refill: 3  - zinc gluconate 50 MG tablet; Take 1 tablet (50 mg total) by mouth daily for 14 days.  Dispense: 14 tablet; Refill: 0 - continue to  wear facial mask,social distance and practice hand hygiene per CDC guidelines.Additional education information provided on AVS to mail to patient.  Family/ staff Communication: Reviewed plan of care with patient and Husband.   Labs/tests ordered: None   Next Appointment: Has upcoming appointment with PCP 08/26/2019 for 4 months follow up.   Spent 11 minutes of non-face to face with patient   Caesar Bookman, NP :

## 2019-08-26 ENCOUNTER — Other Ambulatory Visit: Payer: Self-pay

## 2019-08-26 ENCOUNTER — Encounter: Payer: Self-pay | Admitting: Nurse Practitioner

## 2019-08-26 ENCOUNTER — Ambulatory Visit (INDEPENDENT_AMBULATORY_CARE_PROVIDER_SITE_OTHER): Payer: Medicare PPO | Admitting: Nurse Practitioner

## 2019-08-26 VITALS — BP 116/68 | HR 56 | Temp 97.3°F | Ht 64.0 in | Wt 221.0 lb

## 2019-08-26 DIAGNOSIS — I10 Essential (primary) hypertension: Secondary | ICD-10-CM | POA: Diagnosis not present

## 2019-08-26 DIAGNOSIS — E782 Mixed hyperlipidemia: Secondary | ICD-10-CM

## 2019-08-26 DIAGNOSIS — F0390 Unspecified dementia without behavioral disturbance: Secondary | ICD-10-CM

## 2019-08-26 DIAGNOSIS — F3341 Major depressive disorder, recurrent, in partial remission: Secondary | ICD-10-CM

## 2019-08-26 DIAGNOSIS — Z6837 Body mass index (BMI) 37.0-37.9, adult: Secondary | ICD-10-CM

## 2019-08-26 NOTE — Progress Notes (Deleted)
Careteam: Patient Care Team: Lauree Chandler, NP as PCP - General (Geriatric Medicine) Buford Dresser, MD as PCP - Cardiology (Cardiology)  Advanced Directive information Does Patient Have a Medical Advance Directive?: Yes, Type of Advance Directive: [place], Does patient want to make changes to medical advance directive?: No - Patient declined  Allergies  Allergen Reactions  . Lisinopril Other (See Comments)  . Losartan Other (See Comments)  . Memantine Other (See Comments)    combative  . Peanut-Containing Drug Products   . Tramadol Hives    Chief Complaint  Patient presents with  . Medical Management of Chronic Issues    4 month follow-up. Patient with decreased appetite since Covid-19, graduall increasing. Here with daughter, [name]   . Immunizations    Discuss covid-19 vaccine   . Medication Management    Discuss what dose of ASA patient should take      HPI: Patient is a 79 y.o. female ***  Review of Systems:  ROS***  Past Medical History:  Diagnosis Date  . Gallbladder disorder 2009   Per New Patient Packet   . Heart disease   . High blood pressure    Per New Patient Packet   . High cholesterol    Past Surgical History:  Procedure Laterality Date  . ABDOMINAL HYSTERECTOMY     Per New Patient Packet   . CARDIAC SURGERY  2011   Open Heart Surgey, Per New Patient Packet    . REPLACEMENT TOTAL KNEE Left 2010   Per New Patient Packet    Social History:   reports that she quit smoking about 32 years ago. She has never used smokeless tobacco. She reports previous drug use. She reports that she does not drink alcohol.  Family History  Problem Relation Age of Onset  . Dementia Mother   . Arthritis Mother   . Breast cancer Mother   . Heart attack Father   . Arthritis Father   . Heart attack Sister   . Arthritis Sister   . Breast cancer Sister     Medications: Patient's Medications  New Prescriptions   No  medications on file  Previous Medications   ACETAMINOPHEN (TYLENOL) 325 MG TABLET    Take 650 mg by mouth daily.    ASCORBIC ACID (VITAMIN C) 1000 MG TABLET    Take 1 tablet (1,000 mg total) by mouth daily.   ASPIRIN EC 81 MG TABLET    Take 81 mg by mouth daily.   ATORVASTATIN (LIPITOR) 40 MG TABLET    Take 1 tablet (40 mg total) by mouth daily.   CHOLECALCIFEROL (VITAMIN D3) 25 MCG (1000 UNIT) TABLET    Take 1 tablet (1,000 Units total) by mouth daily.   CITALOPRAM (CELEXA) 20 MG TABLET    Take 1 tablet (20 mg total) by mouth daily.   DICLOFENAC SODIUM (VOLTAREN) 1 % GEL    Apply 4 g topically 4 (four) times daily. To both knees. May apply 2 gm to hands QID for arthritis   DONEPEZIL (ARICEPT) 10 MG TABLET    TAKE 1 TABLET BY MOUTH EVERYDAY AT BEDTIME   ENSURE (ENSURE)    Take 237 mLs by mouth daily.   FUROSEMIDE (LASIX) 20 MG TABLET    Take 1 tablet (20 mg total) by mouth daily as needed.   TRAZODONE (DESYREL) 50 MG TABLET    TAKE 1 TABLET BY MOUTH EVERY DAY IN THE MORNING AND 1/2 TABLET IN THE EVENING  ZINC GLUCONATE 50 MG TABLET    Take 1 tablet (50 mg total) by mouth daily for 14 days.  Modified Medications   No medications on file  Discontinued Medications   No medications on file    Physical Exam:  Vitals:   08/26/19 1502  BP: 116/68  Pulse: (!) 56  Temp: (!) 97.3 F (36.3 C)  TempSrc: Temporal  SpO2: 97%  Weight: 221 lb (100.2 kg)  Height: 5\' 4"  (1.626 m)   Body mass index is 37.93 kg/m. Wt Readings from Last 3 Encounters:  08/26/19 221 lb (100.2 kg)  04/24/19 229 lb (103.9 kg)  01/14/19 235 lb (106.6 kg)    Physical Exam***  Labs reviewed: Basic Metabolic Panel: Recent Labs    11/19/18 0906  NA 140  K 3.5  CL 107  CO2 28  GLUCOSE 91  BUN 10  CREATININE 0.97*  CALCIUM 9.2   Liver Function Tests: Recent Labs    11/19/18 0906  AST 13  ALT 9  BILITOT 0.6  PROT 6.7   No results for input(s): LIPASE, AMYLASE in the last 8760 hours. No results for  input(s): AMMONIA in the last 8760 hours. CBC: Recent Labs    11/19/18 0906 01/14/19 1524  WBC 4.3 4.8  NEUTROABS 2,223 2,482  HGB 13.4 13.3  HCT 41.1 40.0  MCV 83.9 83.7  PLT 142 158   Lipid Panel: Recent Labs    11/19/18 0906  CHOL 174  HDL 61  LDLCALC 100*  TRIG 52  CHOLHDL 2.9   TSH: No results for input(s): TSH in the last 8760 hours. A1C: No results found for: HGBA1C   Assessment/Plan There are no diagnoses linked to this encounter.  Next appt: *** Sheehan Stacey K. 01/19/19  [facility] [phone]

## 2019-08-26 NOTE — Patient Instructions (Signed)
Can you ASA enteric coated 81 mg daily  Encouraged meals and snacks with good protein

## 2019-08-26 NOTE — Progress Notes (Signed)
Careteam: Patient Care Team: [name], NP as PCP - General (Geriatric Medicine) [name], MD as PCP - Cardiology (Cardiology)  Advanced Directive information Does Patient Have a Medical Advance Directive?: Yes, Type of Advance Directive: Healthcare Power of Attorney, Does patient want to make changes to medical advance directive?: No - Patient declined  Allergies  Allergen Reactions  . Lisinopril Other (See Comments)  . Losartan Other (See Comments)  . Memantine Other (See Comments)    combative  . Peanut-Containing Drug Products   . Tramadol Hives    Chief Complaint  Patient presents with  . Medical Management of Chronic Issues    4 month follow-up. Patient with decreased appetite since Covid-19, graduall increasing. Here with daughter, Shawna Orleans   . Immunizations    Discuss covid-19 vaccine   . Medication Management    Discuss what dose of ASA patient should take      HPI: Patient is a 79 y.o. female seen in the office today for routine follow up. Patient's daughter is with her for the visit. She was recently diagnosed with COVID-19 but is recovering well. She lost her taste and appetite was not good prior and has worsened. She does drink Ensure.   OA - chronic joint pain that is controlled with tylenol 500 mg and voltaren gel.   Hyperlipidemia - currently on Lipitor 40 mg.   Depression/anxiety - Patient states that she has been sleeping well, taking trazodone. Daughter reports that she is calmer during the day and sleeping better at night. Continues to take celexa 20 mg daily.   Dementia - lives with her husband. She stays active around the house. Agitation has gotten much better according to patient's daughter. Continues aricept 10 mg daily.   Hypertension - blood pressure is controlled today. She has not been taking her blood pressure at home but feels that it has been controlled.   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 No further chest pains reported. HR has remained stable.   Review of Systems:  Review of Systems  Constitutional: Positive for weight loss. Negative for chills and fever.  HENT: Positive for hearing loss.   Eyes: Negative for blurred vision and double vision.  Respiratory: Negative for cough and shortness of breath.   Cardiovascular: Negative for chest pain and leg swelling.  Gastrointestinal: Negative for abdominal pain, nausea and vomiting.  Genitourinary: Negative for dysuria, frequency and urgency.  Musculoskeletal: Positive for joint pain. Negative for myalgias.  Neurological: Negative for dizziness and headaches.  Endo/Heme/Allergies: Negative.   Psychiatric/Behavioral: Positive for memory loss.    Past Medical History:  Diagnosis Date  . Gallbladder disorder 2009   Per New Patient Packet   . Heart disease   . High blood pressure    Per New Patient Packet   . High cholesterol    Past Surgical History:  Procedure Laterality Date  . ABDOMINAL HYSTERECTOMY     Per New Patient Packet   . CARDIAC SURGERY  2011   Open Heart Surgey, Per New Patient Packet    . REPLACEMENT TOTAL KNEE Left 2010   Per New Patient Packet    Social History:   reports that she quit smoking about 32 years ago. She has never used smokeless tobacco. She reports previous drug use. She reports that she does not drink alcohol.  Family History  Problem Relation Age of Onset  . Dementia Mother   . Arthritis Mother   . Breast cancer  Mother   . Heart attack Father   . Arthritis Father   . Heart attack Sister   . Arthritis Sister   . Breast cancer Sister     Medications: Patient's Medications  New Prescriptions   No medications on file  Previous Medications   ACETAMINOPHEN (TYLENOL) 325 MG TABLET    Take 650 mg by mouth daily.    ASCORBIC ACID (VITAMIN C) 1000 MG TABLET    Take 1 tablet (1,000 mg total) by mouth daily.   ASPIRIN EC 81 MG TABLET    Take 81 mg by mouth daily.    ATORVASTATIN (LIPITOR) 40 MG TABLET    Take 1 tablet (40 mg total) by mouth daily.   CHOLECALCIFEROL (VITAMIN D3) 25 MCG (1000 UNIT) TABLET    Take 1 tablet (1,000 Units total) by mouth daily.   CITALOPRAM (CELEXA) 20 MG TABLET    Take 1 tablet (20 mg total) by mouth daily.   DICLOFENAC SODIUM (VOLTAREN) 1 % GEL    Apply 4 g topically 4 (four) times daily. To both knees. May apply 2 gm to hands QID for arthritis   DONEPEZIL (ARICEPT) 10 MG TABLET    TAKE 1 TABLET BY MOUTH EVERYDAY AT BEDTIME   ENSURE (ENSURE)    Take 237 mLs by mouth daily.   FUROSEMIDE (LASIX) 20 MG TABLET    Take 1 tablet (20 mg total) by mouth daily as needed.   TRAZODONE (DESYREL) 50 MG TABLET    TAKE 1 TABLET BY MOUTH EVERY DAY IN THE MORNING AND 1/2 TABLET IN THE EVENING   ZINC GLUCONATE 50 MG TABLET    Take 1 tablet (50 mg total) by mouth daily for 14 days.  Modified Medications   No medications on file  Discontinued Medications   No medications on file    Physical Exam:  Vitals:   08/26/19 1502  BP: 116/68  Pulse: (!) 56  Temp: (!) 97.3 F (36.3 C)  TempSrc: Temporal  SpO2: 97%  Weight: 221 lb (100.2 kg)  Height: 5\' 4"  (1.626 m)   Body mass index is 37.93 kg/m. Wt Readings from Last 3 Encounters:  08/26/19 221 lb (100.2 kg)  04/24/19 229 lb (103.9 kg)  01/14/19 235 lb (106.6 kg)    Physical Exam HENT:     Head: Normocephalic and atraumatic.  Eyes:     Conjunctiva/sclera: Conjunctivae normal.     Pupils: Pupils are equal, round, and reactive to light.  Cardiovascular:     Rate and Rhythm: Normal rate and regular rhythm.  Pulmonary:     Effort: Pulmonary effort is normal.     Breath sounds: Normal breath sounds.  Abdominal:     General: Bowel sounds are normal.     Palpations: Abdomen is soft.     Tenderness: There is no abdominal tenderness.  Musculoskeletal:        General: Normal range of motion.     Cervical back: Normal range of motion.  Skin:    General: Skin is warm and dry.   Neurological:     Mental Status: She is alert.  Psychiatric:        Mood and Affect: Mood normal.     Comments: Memory loss     Labs reviewed: Basic Metabolic Panel: Recent Labs    11/19/18 0906  NA 140  K 3.5  CL 107  CO2 28  GLUCOSE 91  BUN 10  CREATININE 0.97*  CALCIUM 9.2   Liver Function Tests: Recent  Labs    11/19/18 0906  AST 13  ALT 9  BILITOT 0.6  PROT 6.7   No results for input(s): LIPASE, AMYLASE in the last 8760 hours. No results for input(s): AMMONIA in the last 8760 hours. CBC: Recent Labs    11/19/18 0906 01/14/19 1524  WBC 4.3 4.8  NEUTROABS 2,223 2,482  HGB 13.4 13.3  HCT 41.1 40.0  MCV 83.9 83.7  PLT 142 158   Lipid Panel: Recent Labs    11/19/18 0906  CHOL 174  HDL 61  LDLCALC 100*  TRIG 52  CHOLHDL 2.9   TSH: No results for input(s): TSH in the last 8760 hours. A1C: No results found for: HGBA1C   Assessment/Plan 1. Essential hypertension -controlled on diet, not taking medication at this time -can take ASA 81 mg daily EC - COMPLETE METABOLIC PANEL WITH GFR - CBC with Differential/Platelet  2. Dementia without behavioral disturbance, unspecified dementia type (HCC) -Encouraged to continue exercises and activities -Continue aricept 10 mg daily -Weight loss, most likely due to dementia and recent COVID diagnosis  3. Mixed hyperlipidemia -Continue atorvastatin 40 mg daily - Lipid panel  4. Recurrent major depressive disorder, in partial remission (HCC) - Agitation and anxiety has improved -Continue to stay stimulated with activities and exercise -Continue trazodone 50 mg in the morning and 25 mg in the evening  5. Class 2 severe obesity due to excess calories with serious comorbidity and body mass index (BMI) of 37.0 to 37.9 in adult (HCC) -Weight loss due to decreased appetite and COVID-19  -Encouraged healthy diet with good protein  -Continue with Ensure    Next appt: follow up in 4 months, sooner if needed   Larrisha Babineau K. Biagio Borg  [facility] [phone]   I personally was present during the history, physical exam and medical decision-making activities of this service and have verified that the service and findings are accurately documented in the student's note

## 2019-08-27 ENCOUNTER — Other Ambulatory Visit: Payer: Self-pay | Admitting: Nurse Practitioner

## 2019-08-27 LAB — CBC WITH DIFFERENTIAL/PLATELET
Absolute Monocytes: 422 cells/uL (ref 200–950)
Basophils Absolute: 29 cells/uL (ref 0–200)
Basophils Relative: 0.6 %
Eosinophils Absolute: 110 cells/uL (ref 15–500)
Eosinophils Relative: 2.3 %
HCT: 39.6 % (ref 35.0–45.0)
Hemoglobin: 12.8 g/dL (ref 11.7–15.5)
Lymphs Abs: 1805 cells/uL (ref 850–3900)
MCH: 27.6 pg (ref 27.0–33.0)
MCHC: 32.3 g/dL (ref 32.0–36.0)
MCV: 85.3 fL (ref 80.0–100.0)
MPV: 11 fL (ref 7.5–12.5)
Monocytes Relative: 8.8 %
Neutro Abs: 2434 cells/uL (ref 1500–7800)
Neutrophils Relative %: 50.7 %
Platelets: 252 10*3/uL (ref 140–400)
RBC: 4.64 10*6/uL (ref 3.80–5.10)
RDW: 14.2 % (ref 11.0–15.0)
Total Lymphocyte: 37.6 %
WBC: 4.8 10*3/uL (ref 3.8–10.8)

## 2019-08-27 LAB — COMPLETE METABOLIC PANEL WITH GFR
AG Ratio: 1 (calc) (ref 1.0–2.5)
ALT: 9 U/L (ref 6–29)
AST: 16 U/L (ref 10–35)
Albumin: 3.4 g/dL — ABNORMAL LOW (ref 3.6–5.1)
Alkaline phosphatase (APISO): 85 U/L (ref 37–153)
BUN/Creatinine Ratio: 7 (calc) (ref 6–22)
BUN: 6 mg/dL — ABNORMAL LOW (ref 7–25)
CO2: 27 mmol/L (ref 20–32)
Calcium: 9.4 mg/dL (ref 8.6–10.4)
Chloride: 104 mmol/L (ref 98–110)
Creat: 0.89 mg/dL — ABNORMAL HIGH (ref 0.60–0.88)
GFR, Est African American: 70 mL/min/{1.73_m2} (ref >=60)
GFR, Est Non African American: 61 mL/min/{1.73_m2} (ref >=60)
Globulin: 3.3 g/dL (calc) (ref 1.9–3.7)
Glucose, Bld: 88 mg/dL (ref 65–139)
Potassium: 3.3 mmol/L — ABNORMAL LOW (ref 3.5–5.3)
Sodium: 140 mmol/L (ref 135–146)
Total Bilirubin: 0.5 mg/dL (ref 0.2–1.2)
Total Protein: 6.7 g/dL (ref 6.1–8.1)

## 2019-08-27 LAB — LIPID PANEL
Cholesterol: 119 mg/dL (ref <200)
HDL: 39 mg/dL — ABNORMAL LOW (ref >=50)
LDL Cholesterol (Calc): 63 mg/dL (calc)
Non-HDL Cholesterol (Calc): 80 mg/dL (calc) (ref <130)
Total CHOL/HDL Ratio: 3.1 (calc) (ref <5.0)
Triglycerides: 85 mg/dL (ref <150)

## 2019-08-27 MED ORDER — POTASSIUM CHLORIDE ER 20 MEQ PO TBCR: 20.0000 meq | EXTENDED_RELEASE_TABLET | Freq: Every day | ORAL | 0 refills | Status: DC

## 2019-09-20 ENCOUNTER — Ambulatory Visit (INDEPENDENT_AMBULATORY_CARE_PROVIDER_SITE_OTHER): Payer: Medicare PPO | Admitting: Nurse Practitioner

## 2019-09-20 ENCOUNTER — Other Ambulatory Visit: Payer: Self-pay

## 2019-09-20 ENCOUNTER — Encounter: Payer: Self-pay | Admitting: Nurse Practitioner

## 2019-09-20 VITALS — BP 152/78 | HR 61 | Temp 97.1°F | Ht 64.0 in | Wt 218.0 lb

## 2019-09-20 DIAGNOSIS — I10 Essential (primary) hypertension: Secondary | ICD-10-CM | POA: Diagnosis not present

## 2019-09-20 DIAGNOSIS — L723 Sebaceous cyst: Secondary | ICD-10-CM | POA: Diagnosis not present

## 2019-09-20 MED ORDER — DOXYCYCLINE HYCLATE 100 MG PO TABS: 100.0000 mg | ORAL_TABLET | Freq: Two times a day (BID) | ORAL | 0 refills | Status: DC

## 2019-09-20 MED ORDER — SACCHAROMYCES BOULARDII 250 MG PO CAPS: 250.0000 mg | ORAL_CAPSULE | Freq: Two times a day (BID) | ORAL | 0 refills | Status: DC

## 2019-09-20 NOTE — Patient Instructions (Signed)
Warm compress to the back- 3 times daily Doxycycline 100 mg by mouth twice daily

## 2019-09-20 NOTE — Progress Notes (Signed)
Careteam: Patient Care Team: Lauree Chandler, NP as PCP - General (Geriatric Medicine) Buford Dresser, MD as PCP - Cardiology (Cardiology)  PLACE OF SERVICE:  [place]  Advanced Directive information    Allergies  Allergen Reactions  . Lisinopril Other (See Comments)  . Losartan Other (See Comments)  . Memantine Other (See Comments)    combative  . Peanut-Containing Drug Products   . Tramadol Hives    Chief Complaint  Patient presents with  . Acute Visit    Cyst on back with swelling, pain, and drainage      HPI: Patient is a 79 y.o. female is here for an acute visit for evaluation of a cyst on her back. Her husband states that it has been there for about a year. She states that it just started bothering her about a week ago. It is painful to touch and when she leans back in a chair. Husband reports that when he mashes it he can get it to drain. Drainage is clear and has an odor.  Daughter phoned in and stated that they have tried warm compression but not been successful in the past, husband reports they have not really done this recently. Reports they have had area cut open and drained in the past but generally husband is able to massage and express. They did not do this now because of the discomfort and redness noted.  Hypertension - blood pressure is eleavted on exam. She has not taken her medications yet today.   Review of Systems:  Review of Systems  Constitutional: Negative for chills and fever.  HENT: Negative for congestion and sore throat.   Eyes: Negative.   Respiratory: Negative for cough and shortness of breath.   Cardiovascular: Negative for chest pain.  Gastrointestinal: Negative for abdominal pain and diarrhea.  Skin:       Cyst- painful to back  Psychiatric/Behavioral: Positive for memory loss.    Past Medical History:  Diagnosis Date  . Gallbladder disorder 2009   Per New Patient Packet   . Heart disease   . High blood pressure     Per New Patient Packet   . High cholesterol    Past Surgical History:  Procedure Laterality Date  . ABDOMINAL HYSTERECTOMY     Per New Patient Packet   . CARDIAC SURGERY  2011   Open Heart Surgey, Per New Patient Packet    . REPLACEMENT TOTAL KNEE Left 2010   Per New Patient Packet    Social History:   reports that she quit smoking about 32 years ago. She has never used smokeless tobacco. She reports previous drug use. She reports that she does not drink alcohol.  Family History  Problem Relation Age of Onset  . Dementia Mother   . Arthritis Mother   . Breast cancer Mother   . Heart attack Father   . Arthritis Father   . Heart attack Sister   . Arthritis Sister   . Breast cancer Sister     Medications: Patient's Medications  New Prescriptions   No medications on file  Previous Medications   ACETAMINOPHEN (TYLENOL) 325 MG TABLET    Take 650 mg by mouth daily.    ASCORBIC ACID (VITAMIN C) 1000 MG TABLET    Take 1 tablet (1,000 mg total) by mouth daily.   ASPIRIN EC 81 MG TABLET    Take 81 mg by mouth daily.   ATORVASTATIN (LIPITOR) 40 MG TABLET    Take 1  tablet (40 mg total) by mouth daily.   CHOLECALCIFEROL (VITAMIN D3) 25 MCG (1000 UNIT) TABLET    Take 1 tablet (1,000 Units total) by mouth daily.   CITALOPRAM (CELEXA) 20 MG TABLET    Take 1 tablet (20 mg total) by mouth daily.   DICLOFENAC SODIUM (VOLTAREN) 1 % GEL    Apply 4 g topically 4 (four) times daily. To both knees. May apply 2 gm to hands QID for arthritis   DONEPEZIL (ARICEPT) 10 MG TABLET    TAKE 1 TABLET BY MOUTH EVERYDAY AT BEDTIME   ENSURE (ENSURE)    Take 237 mLs by mouth daily.   FUROSEMIDE (LASIX) 20 MG TABLET    Take 1 tablet (20 mg total) by mouth daily as needed.   POTASSIUM CHLORIDE 20 MEQ TBCR    Take 20 mEq by mouth daily for 2 days.   TRAZODONE (DESYREL) 50 MG TABLET    TAKE 1 TABLET BY MOUTH EVERY DAY IN THE MORNING AND 1/2 TABLET IN THE EVENING  Modified Medications   No medications on file   Discontinued Medications   No medications on file    Physical Exam:  Vitals:   09/20/19 1031  BP: (!) 168/70  Pulse: 61  Temp: (!) 97.1 F (36.2 C)  TempSrc: Temporal  SpO2: 98%  Weight: 218 lb (98.9 kg)  Height: 5\' 4"  (1.626 m)   Body mass index is 37.42 kg/m. Wt Readings from Last 3 Encounters:  09/20/19 218 lb (98.9 kg)  08/26/19 221 lb (100.2 kg)  04/24/19 229 lb (103.9 kg)    Physical Exam HENT:     Head: Normocephalic and atraumatic.  Eyes:     Pupils: Pupils are equal, round, and reactive to light.  Cardiovascular:     Rate and Rhythm: Normal rate and regular rhythm.     Pulses: Normal pulses.     Heart sounds: Normal heart sounds.  Pulmonary:     Effort: Pulmonary effort is normal.     Breath sounds: Normal breath sounds.  Musculoskeletal:     Cervical back: Normal range of motion and neck supple.  Skin:    General: Skin is warm.     Comments: Large tender Cyst on mid back, waxy white substance easily expressed.   Neurological:     Mental Status: She is alert. Mental status is at baseline.     Labs reviewed: Basic Metabolic Panel: Recent Labs    11/19/18 0906 08/26/19 1553  NA 140 140  K 3.5 3.3*  CL 107 104  CO2 28 27  GLUCOSE 91 88  BUN 10 6*  CREATININE 0.97* 0.89*  CALCIUM 9.2 9.4   Liver Function Tests: Recent Labs    11/19/18 0906 08/26/19 1553  AST 13 16  ALT 9 9  BILITOT 0.6 0.5  PROT 6.7 6.7   No results for input(s): LIPASE, AMYLASE in the last 8760 hours. No results for input(s): AMMONIA in the last 8760 hours. CBC: Recent Labs    11/19/18 0906 01/14/19 1524 08/26/19 1553  WBC 4.3 4.8 4.8  NEUTROABS 2,223 2,482 2,434  HGB 13.4 13.3 12.8  HCT 41.1 40.0 39.6  MCV 83.9 83.7 85.3  PLT 142 158 252   Lipid Panel: Recent Labs    11/19/18 0906 08/26/19 1553  CHOL 174 119  HDL 61 39*  LDLCALC 100* 63  TRIG 52 85  CHOLHDL 2.9 3.1   TSH: No results for input(s): TSH in the last 8760 hours. A1C: No results  found for: HGBA1C   Assessment/Plan 1. Sebaceous cyst -Large cyst on mid back that is tender to touch and positive for drainage.  Some color changes noted below cyst with tenderness. Will give antibiotic with warm compress at this time. She will follow up in 1 week if no improvement or worsening.   -Apply warm compress for 30 minutes 2-3 times per day with massage - doxycycline (VIBRA-TABS) 100 MG tablet; Take 1 tablet (100 mg total) by mouth 2 (two) times daily.  Dispense: 20 tablet; Refill: 0  2. Essential hypertension -Elevated on exam today - patient has not taken her medications.  -Continue with current regimen and monitoring at home   Next appt: 01/29/2020 as scheduled.  Janene Harvey. Biagio Borg  [facility] [phone]

## 2019-09-20 NOTE — Progress Notes (Deleted)
Careteam: Patient Care Team: Lauree Chandler, NP as PCP - General (Geriatric Medicine) Buford Dresser, MD as PCP - Cardiology (Cardiology)  PLACE OF SERVICE:  [place]  Advanced Directive information    Allergies  Allergen Reactions  . Lisinopril Other (See Comments)  . Losartan Other (See Comments)  . Memantine Other (See Comments)    combative  . Peanut-Containing Drug Products   . Tramadol Hives    Chief Complaint  Patient presents with  . Acute Visit    Cyst on back with swelling, pain, and drainage      HPI: Patient is a 79 y.o. female ***  Review of Systems:  ROS***  Past Medical History:  Diagnosis Date  . Gallbladder disorder 2009   Per New Patient Packet   . Heart disease   . High blood pressure    Per New Patient Packet   . High cholesterol    Past Surgical History:  Procedure Laterality Date  . ABDOMINAL HYSTERECTOMY     Per New Patient Packet   . CARDIAC SURGERY  2011   Open Heart Surgey, Per New Patient Packet    . REPLACEMENT TOTAL KNEE Left 2010   Per New Patient Packet    Social History:   reports that she quit smoking about 32 years ago. She has never used smokeless tobacco. She reports previous drug use. She reports that she does not drink alcohol.  Family History  Problem Relation Age of Onset  . Dementia Mother   . Arthritis Mother   . Breast cancer Mother   . Heart attack Father   . Arthritis Father   . Heart attack Sister   . Arthritis Sister   . Breast cancer Sister     Medications: Patient's Medications  New Prescriptions   No medications on file  Previous Medications   ACETAMINOPHEN (TYLENOL) 325 MG TABLET    Take 650 mg by mouth daily.    ASCORBIC ACID (VITAMIN C) 1000 MG TABLET    Take 1 tablet (1,000 mg total) by mouth daily.   ASPIRIN EC 81 MG TABLET    Take 81 mg by mouth daily.   ATORVASTATIN (LIPITOR) 40 MG TABLET    Take 1 tablet (40 mg total) by mouth daily.   CHOLECALCIFEROL (VITAMIN D3) 25  MCG (1000 UNIT) TABLET    Take 1 tablet (1,000 Units total) by mouth daily.   CITALOPRAM (CELEXA) 20 MG TABLET    Take 1 tablet (20 mg total) by mouth daily.   DICLOFENAC SODIUM (VOLTAREN) 1 % GEL    Apply 4 g topically 4 (four) times daily. To both knees. May apply 2 gm to hands QID for arthritis   DONEPEZIL (ARICEPT) 10 MG TABLET    TAKE 1 TABLET BY MOUTH EVERYDAY AT BEDTIME   ENSURE (ENSURE)    Take 237 mLs by mouth daily.   FUROSEMIDE (LASIX) 20 MG TABLET    Take 1 tablet (20 mg total) by mouth daily as needed.   POTASSIUM CHLORIDE 20 MEQ TBCR    Take 20 mEq by mouth daily for 2 days.   TRAZODONE (DESYREL) 50 MG TABLET    TAKE 1 TABLET BY MOUTH EVERY DAY IN THE MORNING AND 1/2 TABLET IN THE EVENING  Modified Medications   No medications on file  Discontinued Medications   No medications on file    Physical Exam:  Vitals:   09/20/19 1031  BP: (!) 168/70  Pulse: 61  Temp: (!) 97.1  F (36.2 C)  TempSrc: Temporal  SpO2: 98%  Weight: 218 lb (98.9 kg)  Height: 5\' 4"  (1.626 m)   Body mass index is 37.42 kg/m. Wt Readings from Last 3 Encounters:  09/20/19 218 lb (98.9 kg)  08/26/19 221 lb (100.2 kg)  04/24/19 229 lb (103.9 kg)    Physical Exam***  Labs reviewed: Basic Metabolic Panel: Recent Labs    11/19/18 0906 08/26/19 1553  NA 140 140  K 3.5 3.3*  CL 107 104  CO2 28 27  GLUCOSE 91 88  BUN 10 6*  CREATININE 0.97* 0.89*  CALCIUM 9.2 9.4   Liver Function Tests: Recent Labs    11/19/18 0906 08/26/19 1553  AST 13 16  ALT 9 9  BILITOT 0.6 0.5  PROT 6.7 6.7   No results for input(s): LIPASE, AMYLASE in the last 8760 hours. No results for input(s): AMMONIA in the last 8760 hours. CBC: Recent Labs    11/19/18 0906 01/14/19 1524 08/26/19 1553  WBC 4.3 4.8 4.8  NEUTROABS 2,223 2,482 2,434  HGB 13.4 13.3 12.8  HCT 41.1 40.0 39.6  MCV 83.9 83.7 85.3  PLT 142 158 252   Lipid Panel: Recent Labs    11/19/18 0906 08/26/19 1553  CHOL 174 119  HDL 61  39*  LDLCALC 100* 63  TRIG 52 85  CHOLHDL 2.9 3.1   TSH: No results for input(s): TSH in the last 8760 hours. A1C: No results found for: HGBA1C   Assessment/Plan There are no diagnoses linked to this encounter.  Next appt: *** Angeliz Settlemyre K. 08/28/19  [facility] [phone]

## 2019-09-26 ENCOUNTER — Telehealth: Payer: Self-pay

## 2019-09-26 NOTE — Telephone Encounter (Signed)
We can either see her and discuss this at a visit (virtual or in office) or she can go to a psychiatrist.

## 2019-09-26 NOTE — Telephone Encounter (Signed)
Left detailed message on voicemail for [place] with [name], NP response to cyst and behaviors. Melanie instructed to call back and advise if she would like to schedule an in office appointment, video visit, or we can provide information for Dr.Plovksy to address behaviors associated with dementia.

## 2019-09-26 NOTE — Telephone Encounter (Signed)
1.) Incoming call received from Shawna Orleans (patients daughter) to inform Laurie Seller, NP that cyst has gone down yet the drainage is dark in color, has an odor, and the location of cyst is very painful. Shawna Orleans is not sure what can be done but would like recommendations about the pain.   Shawna Orleans mentioned that maybe cyst needs to be cut open. Patient has 3-4 days left of antibiotic.  Please advise   2.) Shawna Orleans would also like advice or recommendations on progressive dementia. Patient is never calm, always on go mode. Patients behavior is getting out of control and her husband is worn out. Shawna Orleans is not sure if her mother needs to see a specialist (mentioned neurologist)  Please advise

## 2019-09-26 NOTE — Telephone Encounter (Signed)
Please clarify what is the next step we will do in managing behaviors?

## 2019-09-26 NOTE — Telephone Encounter (Signed)
Patients daughter called to inquire if I sent message to Shanda Bumps for its has been several hours with no reply. I informed Shawna Orleans that I am awaiting a response form Shanda Bumps and will call when response is available.

## 2019-09-26 NOTE — Telephone Encounter (Signed)
Spoke with Laurie Mays and she verbalized understanding of Jessica's responses. Laurie Mays will talk this over with her father and call back to schedule appointment to discuss behaviors. Laurie Mays states she is certain that her mother will deny behaviors when Shanda Bumps brings it up either way something has to be done.   Laurie Mays will follow instructions regarding cyst and call when antibiotic is complete if symptoms persist or progress

## 2019-09-26 NOTE — Telephone Encounter (Signed)
If it is draining that is a good sign, would continue antibiotics until complete with warm compresses throughout the day and massage to help express substance. If continues to be large, painful and foul odor after completion of antibiotic to notify.  In regards to a neuro referral we can help manage behaviors in office without referral to neurologist, she may want to look into a geriatric psychiatrist if anything. Dr Donell Beers is one in the area.

## 2019-09-27 ENCOUNTER — Ambulatory Visit (INDEPENDENT_AMBULATORY_CARE_PROVIDER_SITE_OTHER): Payer: Medicare PPO | Admitting: Nurse Practitioner

## 2019-09-27 ENCOUNTER — Other Ambulatory Visit: Payer: Self-pay

## 2019-09-27 ENCOUNTER — Encounter: Payer: Self-pay | Admitting: Nurse Practitioner

## 2019-09-27 VITALS — BP 160/80 | HR 86 | Temp 96.8°F | Ht 64.0 in | Wt 218.0 lb

## 2019-09-27 DIAGNOSIS — F0391 Unspecified dementia with behavioral disturbance: Secondary | ICD-10-CM

## 2019-09-27 DIAGNOSIS — L723 Sebaceous cyst: Secondary | ICD-10-CM

## 2019-09-27 DIAGNOSIS — L089 Local infection of the skin and subcutaneous tissue, unspecified: Secondary | ICD-10-CM | POA: Diagnosis not present

## 2019-09-27 MED ORDER — DOXYCYCLINE HYCLATE 100 MG PO TABS: 100.0000 mg | ORAL_TABLET | Freq: Two times a day (BID) | ORAL | 0 refills | Status: DC

## 2019-09-27 NOTE — Patient Instructions (Signed)
TWICE DAILY dressing changes with wound care  Remove dressing Apply warm compress Cleanse area  And redress wound

## 2019-09-27 NOTE — Progress Notes (Signed)
Careteam: Patient Care Team: [name], NP as PCP - General (Geriatric Medicine) [name], MD as PCP - Cardiology (Cardiology)  PLACE OF SERVICE:  [facility]  Advanced Directive information    Allergies  Allergen Reactions  . Lisinopril Other (See Comments)  . Losartan Other (See Comments)  . Memantine Other (See Comments)    combative  . Peanut-Containing Drug Products   . Tramadol Hives    Chief Complaint  Patient presents with  . Acute Visit    Examine cyst on back, patient c/o pain.      HPI: Patient is a 79 y.o. female due to painful cyst on back. She was in last week due to cyst.  Rx for doxycycline given last week but area has become larger and more painful. Redness around area as well. 2 areas have appeared with minimal cloudy drainage with odor. Family requesting area to be cut opened and drained- she had required this in the past but was ~10 years ago.   No fevers or chills.  Review of Systems:  Review of Systems  Constitutional: Negative for chills, fever and malaise/fatigue.  Skin:       Large painful cyst to back  Psychiatric/Behavioral: Positive for memory loss. The patient is nervous/anxious.        Appears to have some increase in confusion    Past Medical History:  Diagnosis Date  . Gallbladder disorder 2009   Per New Patient Packet   . Heart disease   . High blood pressure    Per New Patient Packet   . High cholesterol    Past Surgical History:  Procedure Laterality Date  . ABDOMINAL HYSTERECTOMY     Per New Patient Packet   . CARDIAC SURGERY  2011   Open Heart Surgey, Per New Patient Packet    . REPLACEMENT TOTAL KNEE Left 2010   Per New Patient Packet    Social History:   reports that she quit smoking about 32 years ago. She has never used smokeless tobacco. She reports previous drug use. She reports that she does not drink alcohol.  Family History  Problem Relation Age of Onset  . Dementia Mother   .  Arthritis Mother   . Breast cancer Mother   . Heart attack Father   . Arthritis Father   . Heart attack Sister   . Arthritis Sister   . Breast cancer Sister     Medications: Patient's Medications  New Prescriptions   No medications on file  Previous Medications   ACETAMINOPHEN (TYLENOL) 325 MG TABLET    Take 650 mg by mouth daily.    ASCORBIC ACID (VITAMIN C) 1000 MG TABLET    Take 1 tablet (1,000 mg total) by mouth daily.   ASPIRIN EC 81 MG TABLET    Take 81 mg by mouth daily.   ATORVASTATIN (LIPITOR) 40 MG TABLET    Take 1 tablet (40 mg total) by mouth daily.   CHOLECALCIFEROL (VITAMIN D3) 25 MCG (1000 UNIT) TABLET    Take 1 tablet (1,000 Units total) by mouth daily.   CITALOPRAM (CELEXA) 20 MG TABLET    Take 1 tablet (20 mg total) by mouth daily.   DICLOFENAC SODIUM (VOLTAREN) 1 % GEL    Apply 4 g topically 4 (four) times daily. To both knees. May apply 2 gm to hands QID for arthritis   DONEPEZIL (ARICEPT) 10 MG TABLET    TAKE 1 TABLET BY MOUTH EVERYDAY AT BEDTIME  DOXYCYCLINE (VIBRA-TABS) 100 MG TABLET    Take 1 tablet (100 mg total) by mouth 2 (two) times daily.   ENSURE (ENSURE)    Take 237 mLs by mouth daily.   FUROSEMIDE (LASIX) 20 MG TABLET    Take 1 tablet (20 mg total) by mouth daily as needed.   POTASSIUM CHLORIDE 20 MEQ TBCR    Take 20 mEq by mouth daily for 2 days.   SACCHAROMYCES BOULARDII (FLORASTOR) 250 MG CAPSULE    Take 1 capsule (250 mg total) by mouth 2 (two) times daily.   TRAZODONE (DESYREL) 50 MG TABLET    TAKE 1 TABLET BY MOUTH EVERY DAY IN THE MORNING AND 1/2 TABLET IN THE EVENING  Modified Medications   No medications on file  Discontinued Medications   No medications on file    Physical Exam:  Vitals:   09/27/19 1509  BP: (!) 160/80  Pulse: 86  Temp: (!) 96.8 F (36 C)  TempSrc: Temporal  Weight: 218 lb (98.9 kg)  Height: 5\' 4"  (1.626 m)   Body mass index is 37.42 kg/m. Wt Readings from Last 3 Encounters:  09/27/19 218 lb (98.9 kg)   09/20/19 218 lb (98.9 kg)  08/26/19 221 lb (100.2 kg)    Physical Exam Constitutional:      Appearance: Normal appearance.  HENT:     Head: Normocephalic and atraumatic.  Skin:    Findings: Abscess present.          Comments: Large abscess noted to mid-back right side of spine, very tender on exam. 2 open areas with purulent drainage and crusting   Neurological:     Mental Status: She is alert.     Labs reviewed: Basic Metabolic Panel: Recent Labs    11/19/18 0906 08/26/19 1553  NA 140 140  K 3.5 3.3*  CL 107 104  CO2 28 27  GLUCOSE 91 88  BUN 10 6*  CREATININE 0.97* 0.89*  CALCIUM 9.2 9.4   Liver Function Tests: Recent Labs    11/19/18 0906 08/26/19 1553  AST 13 16  ALT 9 9  BILITOT 0.6 0.5  PROT 6.7 6.7   No results for input(s): LIPASE, AMYLASE in the last 8760 hours. No results for input(s): AMMONIA in the last 8760 hours. CBC: Recent Labs    11/19/18 0906 01/14/19 1524 08/26/19 1553  WBC 4.3 4.8 4.8  NEUTROABS 2,223 2,482 2,434  HGB 13.4 13.3 12.8  HCT 41.1 40.0 39.6  MCV 83.9 83.7 85.3  PLT 142 158 252   Lipid Panel: Recent Labs    11/19/18 0906 08/26/19 1553  CHOL 174 119  HDL 61 39*  LDLCALC 100* 63  TRIG 52 85  CHOLHDL 2.9 3.1   TSH: No results for input(s): TSH in the last 8760 hours. A1C: No results found for: HGBA1C   Assessment/Plan 1. Infected sebaceous cyst -consent given for I&D, reviewed risk vs benefit with risk of infection, bleeding. Area was cleaned with betadine and alcohol swab, sprayed with numbing spray then area numbed with lidocaine. Once numb, cleanses area again with betadine prior to incision-  2 incisions placed over areas that had opened with drainage ~1 cm length. Large amount of bloody purulent drainage expressed and raised area went down. -area cleaned with NS and dressed with 4 x 4 gauze, instructions given to family for twice daily dressing changes -will extend antibiotic so she has a total of 1  more week of doxycyline.  - doxycycline (VIBRA-TABS) 100 MG tablet;  Take 1 tablet (100 mg total) by mouth 2 (two) times daily.  Dispense: 8 tablet; Refill: 0 -to notify if increase pain, redness, swelling   2. Dementia with behavioral disturbance, unspecified dementia type ([place]) -ongoing, resources given and discussed in details. She would benefit from a day program unfortunately her local day program stopped due to [place].    Next appt: 01/29/2020 as scheduled, sooner if needed  Teliah Buffalo K. Harle Battiest  [facility] [phone]   Total time 33mins  time greater than 50% of total time spent doing pt counseling and coordination of care

## 2019-09-27 NOTE — Telephone Encounter (Signed)
Daughter left message on triage VM and stated that her mother is complaining more about he cyst on her back, pt was up last night in pain. Per daughter the cyst is dark and they think it needs to be lanced and drained. Pt's husband would like this done asap. Please advise

## 2019-09-27 NOTE — Telephone Encounter (Signed)
If he wants to bring her in for an appt we can do this in office

## 2019-09-27 NOTE — Telephone Encounter (Signed)
appt scheduled

## 2019-10-07 ENCOUNTER — Ambulatory Visit (INDEPENDENT_AMBULATORY_CARE_PROVIDER_SITE_OTHER): Payer: Medicare PPO | Admitting: Podiatry

## 2019-10-07 ENCOUNTER — Other Ambulatory Visit: Payer: Self-pay

## 2019-10-07 DIAGNOSIS — L84 Corns and callosities: Secondary | ICD-10-CM

## 2019-10-07 DIAGNOSIS — N183 Chronic kidney disease, stage 3 unspecified: Secondary | ICD-10-CM

## 2019-10-07 DIAGNOSIS — B351 Tinea unguium: Secondary | ICD-10-CM

## 2019-10-07 NOTE — Progress Notes (Signed)
  Subjective:  Patient ID: Laurie Mays, female    DOB: [birth year],  MRN: [id number]  Chief Complaint  Patient presents with  . debride    Nails and callus care/trimming -pt not diabetic or on BT    79 y.o. female presents with the above complaint. History confirmed with patient.   Objective:  Physical Exam: warm, good capillary refill, nail exam onychomycosis of the toenails, no trophic changes or ulcerative lesions. DP pulses palpable, PT pulses palpable and protective sensation intact. HPK submet 5 bilat  No images are attached to the encounter.  Assessment:   1. Onychomycosis   2. Stage 3 chronic kidney disease, unspecified whether stage 3a or 3b CKD   3. Callus      Plan:  Patient was evaluated and treated and all questions answered.  Onychomycosis CKD -Nails palliatively debrided secondary to pain  Procedure: Nail Debridement Rationale: Patient meets criteria for routine foot care due to CKD Type of Debridement: manual, sharp debridement. Instrumentation: Nail nipper, rotary burr. Number of Nails: 10   Procedure: Paring of Lesion Rationale: painful hyperkeratotic lesion Type of Debridement: manual, sharp debridement. Instrumentation: 312 blade Number of Lesions: 2  Return in about 3 weeks (around 10/28/2019) for Diabetic Foot Care.

## 2019-10-22 ENCOUNTER — Telehealth: Payer: Self-pay | Admitting: *Deleted

## 2019-10-22 NOTE — Telephone Encounter (Signed)
Spoke with Shawna Orleans, daughter. Appointment scheduled for in Office 10/23/19 with Shanda Bumps.

## 2019-10-22 NOTE — Telephone Encounter (Signed)
Patient daughter, Shawna Orleans called requesting a medication to help keep patient calm in the evening. Stated that she needs something because her "engine is always on go" Stated that she is giving her husband a hard time. Daughter suspects patient is experiencing sundowners. Stated that they have tried Trazodone with no relief. Please Advise.

## 2019-10-22 NOTE — Telephone Encounter (Signed)
There are a few options but we would need to have a visit to discuss and talk about risk vs benefit on medications.  She can do this via virtual visit if that would be convenient for them.

## 2019-10-23 ENCOUNTER — Encounter: Payer: Self-pay | Admitting: Nurse Practitioner

## 2019-10-23 ENCOUNTER — Other Ambulatory Visit: Payer: Self-pay

## 2019-10-23 ENCOUNTER — Ambulatory Visit (INDEPENDENT_AMBULATORY_CARE_PROVIDER_SITE_OTHER): Payer: Medicare PPO | Admitting: Nurse Practitioner

## 2019-10-23 VITALS — BP 134/78 | HR 60 | Temp 97.1°F | Wt 221.0 lb

## 2019-10-23 DIAGNOSIS — F0390 Unspecified dementia without behavioral disturbance: Secondary | ICD-10-CM

## 2019-10-23 DIAGNOSIS — F419 Anxiety disorder, unspecified: Secondary | ICD-10-CM

## 2019-10-23 MED ORDER — BUSPIRONE HCL 5 MG PO TABS: 5.0000 mg | ORAL_TABLET | Freq: Two times a day (BID) | ORAL | 0 refills | Status: DC | PRN

## 2019-10-23 NOTE — Progress Notes (Signed)
Careteam: Patient Care Team: [name], NP as PCP - General (Geriatric Medicine) [name], MD as PCP - Cardiology (Cardiology)  PLACE OF SERVICE:  [facility]  Advanced Directive information    Allergies  Allergen Reactions  . Lisinopril Other (See Comments)  . Losartan Other (See Comments)  . Memantine Other (See Comments)    combative  . Peanut-Containing Drug Products   . Tramadol Hives    Chief Complaint  Patient presents with  . Acute Visit    Requesting medication to help calm patient in the evening. Here with husband, daughter will arrive later.      HPI: Patient is a 79 y.o. female to discuss medications for increase in anxiety in the afternoon. Daughter looking into more help for her Dad who is the primary caregiver.  She wants to continuous be on the go and if she does not she gets agitated  Husband reports he can deal with her most of the time but the daughter states that sometimes the anxiety and agitation is overwhelming.   Infected cyst continued to drained and has healed. Without ongoing tenderness.   Review of Systems:  Review of Systems  Constitutional: Negative for chills and fever.  Psychiatric/Behavioral: Positive for memory loss. Negative for depression. The patient is nervous/anxious.    Past Medical History:  Diagnosis Date  . Gallbladder disorder 2009   Per New Patient Packet   . Heart disease   . High blood pressure    Per New Patient Packet   . High cholesterol    Past Surgical History:  Procedure Laterality Date  . ABDOMINAL HYSTERECTOMY     Per New Patient Packet   . CARDIAC SURGERY  2011   Open Heart Surgey, Per New Patient Packet    . REPLACEMENT TOTAL KNEE Left 2010   Per New Patient Packet    Social History:   reports that she quit smoking about 32 years ago. She has never used smokeless tobacco. She reports previous drug use. She reports that she does not drink alcohol.  Family History  Problem  Relation Age of Onset  . Dementia Mother   . Arthritis Mother   . Breast cancer Mother   . Heart attack Father   . Arthritis Father   . Heart attack Sister   . Arthritis Sister   . Breast cancer Sister     Medications: Patient's Medications  New Prescriptions   No medications on file  Previous Medications   ACETAMINOPHEN (TYLENOL) 325 MG TABLET    Take 650 mg by mouth daily.    ASCORBIC ACID (VITAMIN C) 1000 MG TABLET    Take 1 tablet (1,000 mg total) by mouth daily.   ASPIRIN EC 81 MG TABLET    Take 81 mg by mouth daily.   ATORVASTATIN (LIPITOR) 40 MG TABLET    Take 1 tablet (40 mg total) by mouth daily.   CHOLECALCIFEROL (VITAMIN D3) 25 MCG (1000 UNIT) TABLET    Take 1 tablet (1,000 Units total) by mouth daily.   CITALOPRAM (CELEXA) 20 MG TABLET    Take 1 tablet (20 mg total) by mouth daily.   DICLOFENAC SODIUM (VOLTAREN) 1 % GEL    Apply 4 g topically 4 (four) times daily. To both knees. May apply 2 gm to hands QID for arthritis   DONEPEZIL (ARICEPT) 10 MG TABLET    TAKE 1 TABLET BY MOUTH EVERYDAY AT BEDTIME   DOXYCYCLINE (VIBRA-TABS) 100 MG TABLET  Take 1 tablet (100 mg total) by mouth 2 (two) times daily.   ENSURE (ENSURE)    Take 237 mLs by mouth daily.   FUROSEMIDE (LASIX) 20 MG TABLET    Take 1 tablet (20 mg total) by mouth daily as needed.   POTASSIUM CHLORIDE 20 MEQ TBCR    Take 20 mEq by mouth daily for 2 days.   SACCHAROMYCES BOULARDII (FLORASTOR) 250 MG CAPSULE    Take 1 capsule (250 mg total) by mouth 2 (two) times daily.   TRAZODONE (DESYREL) 50 MG TABLET    TAKE 1 TABLET BY MOUTH EVERY DAY IN THE MORNING AND 1/2 TABLET IN THE EVENING  Modified Medications   No medications on file  Discontinued Medications   No medications on file    Physical Exam:  Vitals:   10/23/19 1327  BP: 134/78  Pulse: 60  Temp: (!) 97.1 F (36.2 C)  TempSrc: Temporal  SpO2: 99%  Weight: 221 lb (100.2 kg)   Body mass index is 37.93 kg/m. Wt Readings from Last 3 Encounters:   10/23/19 221 lb (100.2 kg)  09/27/19 218 lb (98.9 kg)  09/20/19 218 lb (98.9 kg)    Physical Exam Constitutional:      Appearance: Normal appearance.  Skin:    General: Skin is warm and dry.  Neurological:     Mental Status: She is alert. Mental status is at baseline. She is disoriented.  Psychiatric:        Mood and Affect: Mood normal.        Speech: Speech normal.        Behavior: Behavior normal.     Labs reviewed: Basic Metabolic Panel: Recent Labs    11/19/18 0906 08/26/19 1553  NA 140 140  K 3.5 3.3*  CL 107 104  CO2 28 27  GLUCOSE 91 88  BUN 10 6*  CREATININE 0.97* 0.89*  CALCIUM 9.2 9.4   Liver Function Tests: Recent Labs    11/19/18 0906 08/26/19 1553  AST 13 16  ALT 9 9  BILITOT 0.6 0.5  PROT 6.7 6.7   No results for input(s): LIPASE, AMYLASE in the last 8760 hours. No results for input(s): AMMONIA in the last 8760 hours. CBC: Recent Labs    11/19/18 0906 01/14/19 1524 08/26/19 1553  WBC 4.3 4.8 4.8  NEUTROABS 2,223 2,482 2,434  HGB 13.4 13.3 12.8  HCT 41.1 40.0 39.6  MCV 83.9 83.7 85.3  PLT 142 158 252   Lipid Panel: Recent Labs    11/19/18 0906 08/26/19 1553  CHOL 174 119  HDL 61 39*  LDLCALC 100* 63  TRIG 52 85  CHOLHDL 2.9 3.1   TSH: No results for input(s): TSH in the last 8760 hours. A1C: No results found for: HGBA1C   Assessment/Plan 1. Dementia with behavioral disturbance, unspecified dementia type ([place]) Ongoing dementia, pt stays at home with husband and family is looking for a day program that she can attend but she is very busy and likes to be on the go, when she is not able to get out and about like she wants she can get agitated and it is lot for family. Husband reports he is able to handle it at this time but daughter would like something when she gets very agitated due to anxiety  2. Anxiety -pt currently on trazodone with celexa. In the evenings likes to get out and sometimes can not be redirected. Most of  the time the increase in agitation/anxiety is in  the evening due to not being able to ride. Will add buspar twice daily as needed. Discussed in length on possible side effects/adverse effect such as increase confusion, agitation, risk of falls and fatigue. They will use as needed but if causes any adverse reaction will stop and notify office.  - busPIRone (BUSPAR) 5 MG tablet; Take 1 tablet (5 mg total) by mouth 2 (two) times daily as needed.  Dispense: 60 tablet; Refill: 0  Next appt: 01/29/2020 as scheduled.  Janene Harvey. Biagio Borg  [facility] [phone]

## 2019-10-23 NOTE — Patient Instructions (Addendum)
To use buspar 5 mg by mouth up to twice daily as needed for ANXIETY

## 2019-10-31 ENCOUNTER — Other Ambulatory Visit: Payer: Self-pay | Admitting: Nurse Practitioner

## 2019-10-31 DIAGNOSIS — E782 Mixed hyperlipidemia: Secondary | ICD-10-CM

## 2019-11-14 ENCOUNTER — Other Ambulatory Visit: Payer: Self-pay | Admitting: Nurse Practitioner

## 2019-11-14 DIAGNOSIS — F419 Anxiety disorder, unspecified: Secondary | ICD-10-CM

## 2019-12-03 ENCOUNTER — Other Ambulatory Visit: Payer: Self-pay | Admitting: Nurse Practitioner

## 2019-12-03 DIAGNOSIS — F0390 Unspecified dementia without behavioral disturbance: Secondary | ICD-10-CM

## 2019-12-08 ENCOUNTER — Other Ambulatory Visit: Payer: Self-pay | Admitting: Nurse Practitioner

## 2019-12-08 DIAGNOSIS — F32A Depression, unspecified: Secondary | ICD-10-CM

## 2019-12-08 DIAGNOSIS — G4700 Insomnia, unspecified: Secondary | ICD-10-CM

## 2019-12-10 NOTE — Telephone Encounter (Signed)
Pended Rx and sent to [facility] for approval due to HIGH ALERT Warning.  

## 2019-12-11 ENCOUNTER — Other Ambulatory Visit: Payer: Self-pay | Admitting: Nurse Practitioner

## 2019-12-11 DIAGNOSIS — F419 Anxiety disorder, unspecified: Secondary | ICD-10-CM

## 2019-12-20 ENCOUNTER — Other Ambulatory Visit: Payer: Self-pay | Admitting: Nurse Practitioner

## 2019-12-20 DIAGNOSIS — M2540 Effusion, unspecified joint: Secondary | ICD-10-CM

## 2020-01-07 ENCOUNTER — Other Ambulatory Visit: Payer: Self-pay

## 2020-01-07 ENCOUNTER — Ambulatory Visit (INDEPENDENT_AMBULATORY_CARE_PROVIDER_SITE_OTHER): Payer: Medicare PPO | Admitting: Podiatry

## 2020-01-07 DIAGNOSIS — L84 Corns and callosities: Secondary | ICD-10-CM

## 2020-01-07 DIAGNOSIS — B351 Tinea unguium: Secondary | ICD-10-CM

## 2020-01-07 DIAGNOSIS — N183 Chronic kidney disease, stage 3 unspecified: Secondary | ICD-10-CM

## 2020-01-07 NOTE — Progress Notes (Signed)
  Subjective:  Patient ID: Laurie Mays, female    DOB: 1941/03/05,  MRN: [id number]  Chief Complaint  Patient presents with  . debride    RFC   79 y.o. female presents with the above complaint. History confirmed with patient.   Objective:  Physical Exam: warm, good capillary refill, nail exam onychomycosis of the toenails, no trophic changes or ulcerative lesions. DP pulses palpable, PT pulses palpable and protective sensation intact. HPK submet 5 bilat  No images are attached to the encounter.  Assessment:   No diagnosis found.   Plan:  Patient was evaluated and treated and all questions answered.  Onychomycosis CKD -Nails palliatively debrided secondary to pain  Procedure: Nail Debridement Rationale: Patient meets criteria for routine foot care due to CKD Type of Debridement: manual, sharp debridement. Instrumentation: Nail nipper, rotary burr. Number of Nails: 10   Procedure: Paring of Lesion Rationale: painful hyperkeratotic lesion Type of Debridement: manual, sharp debridement. Instrumentation: 312 blade Number of Lesions: 2   No follow-ups on file.

## 2020-01-24 ENCOUNTER — Ambulatory Visit: Payer: Medicare PPO | Admitting: Family

## 2020-01-29 ENCOUNTER — Other Ambulatory Visit: Payer: Self-pay

## 2020-01-29 ENCOUNTER — Encounter: Payer: Self-pay | Admitting: Nurse Practitioner

## 2020-01-29 ENCOUNTER — Ambulatory Visit (INDEPENDENT_AMBULATORY_CARE_PROVIDER_SITE_OTHER): Payer: Medicare PPO | Admitting: Nurse Practitioner

## 2020-01-29 VITALS — BP 126/60 | HR 76 | Temp 96.8°F | Ht 64.0 in | Wt 235.0 lb

## 2020-01-29 DIAGNOSIS — E2839 Other primary ovarian failure: Secondary | ICD-10-CM

## 2020-01-29 DIAGNOSIS — Z0000 Encounter for general adult medical examination without abnormal findings: Secondary | ICD-10-CM

## 2020-01-29 NOTE — Patient Instructions (Signed)
Laurie Mays , Thank you for taking time to come for your Medicare Wellness Visit. I appreciate your ongoing commitment to your health goals. Please review the following plan we discussed and let me know if I can assist you in the future.   Screening recommendations/referrals: Colonoscopy aged out Mammogram aged out Bone Density TO SCHEDULE APPT by calling  [phone] Recommended yearly ophthalmology/optometry visit for glaucoma screening and checkup Recommended yearly dental visit for hygiene and checkup  Vaccinations: Influenza vaccine up to date- to get in Sept at office or pharmacy Pneumococcal vaccine completed today Tdap vaccine NEEDS- to get at local pharmacy Shingles vaccine -NEEDS- to get at local pharmacy    Advanced directives: RECOMMENDED TO COMPLETE_ to look over MOST form with family, we can complete this together to put on your chart  Conditions/risks identified: advance age, progressive memory loss, fall risk,   Next appointment: 1 year    Preventive Care 60 Years and Older, Female Preventive care refers to lifestyle choices and visits with your health care provider that can promote health and wellness. What does preventive care include?  A yearly physical exam. This is also called an annual well check.  Dental exams once or twice a year.  Routine eye exams. Ask your health care provider how often you should have your eyes checked.  Personal lifestyle choices, including:  Daily care of your teeth and gums.  Regular physical activity.  Eating a healthy diet.  Avoiding tobacco and drug use.  Limiting alcohol use.  Practicing safe sex.  Taking low-dose aspirin every day.  Taking vitamin and mineral supplements as recommended by your health care provider. What happens during an annual well check? The services and screenings done by your health care provider during your annual well check will depend on your age, overall health, lifestyle risk factors, and  family history of disease. Counseling  Your health care provider may ask you questions about your:  Alcohol use.  Tobacco use.  Drug use.  Emotional well-being.  Home and relationship well-being.  Sexual activity.  Eating habits.  History of falls.  Memory and ability to understand (cognition).  Work and work Astronomer.  Reproductive health. Screening  You may have the following tests or measurements:  Height, weight, and BMI.  Blood pressure.  Lipid and cholesterol levels. These may be checked every 5 years, or more frequently if you are over 93 years old.  Skin check.  Lung cancer screening. You may have this screening every year starting at age 39 if you have a 30-pack-year history of smoking and currently smoke or have quit within the past 15 years.  Fecal occult blood test (FOBT) of the stool. You may have this test every year starting at age 37.  Flexible sigmoidoscopy or colonoscopy. You may have a sigmoidoscopy every 5 years or a colonoscopy every 10 years starting at age 90.  Hepatitis C blood test.  Hepatitis B blood test.  Sexually transmitted disease (STD) testing.  Diabetes screening. This is done by checking your blood sugar (glucose) after you have not eaten for a while (fasting). You may have this done every 1-3 years.  Bone density scan. This is done to screen for osteoporosis. You may have this done starting at age 78.  Mammogram. This may be done every 1-2 years. Talk to your health care provider about how often you should have regular mammograms. Talk with your health care provider about your test results, treatment options, and if necessary, the need for  more tests. Vaccines  Your health care provider may recommend certain vaccines, such as:  Influenza vaccine. This is recommended every year.  Tetanus, diphtheria, and acellular pertussis (Tdap, Td) vaccine. You may need a Td booster every 10 years.  Zoster vaccine. You may need this  after age 14.  Pneumococcal 13-valent conjugate (PCV13) vaccine. One dose is recommended after age 53.  Pneumococcal polysaccharide (PPSV23) vaccine. One dose is recommended after age 74. Talk to your health care provider about which screenings and vaccines you need and how often you need them. This information is not intended to replace advice given to you by your health care provider. Make sure you discuss any questions you have with your health care provider. Document Released: 07/24/2015 Document Revised: 03/16/2016 Document Reviewed: 04/28/2015 Elsevier Interactive Patient Education  2017 [place] Prevention in the Home Falls can cause injuries. They can happen to people of all ages. There are many things you can do to make your home safe and to help prevent falls. What can I do on the outside of my home?  Regularly fix the edges of walkways and driveways and fix any cracks.  Remove anything that might make you trip as you walk through a door, such as a raised step or threshold.  Trim any bushes or trees on the path to your home.  Use bright outdoor lighting.  Clear any walking paths of anything that might make someone trip, such as rocks or tools.  Regularly check to see if handrails are loose or broken. Make sure that both sides of any steps have handrails.  Any raised decks and porches should have guardrails on the edges.  Have any leaves, snow, or ice cleared regularly.  Use sand or salt on walking paths during winter.  Clean up any spills in your garage right away. This includes oil or grease spills. What can I do in the bathroom?  Use night lights.  Install grab bars by the toilet and in the tub and shower. Do not use towel bars as grab bars.  Use non-skid mats or decals in the tub or shower.  If you need to sit down in the shower, use a plastic, non-slip stool.  Keep the floor dry. Clean up any water that spills on the floor as soon as it  happens.  Remove soap buildup in the tub or shower regularly.  Attach bath mats securely with double-sided non-slip rug tape.  Do not have throw rugs and other things on the floor that can make you trip. What can I do in the bedroom?  Use night lights.  Make sure that you have a light by your bed that is easy to reach.  Do not use any sheets or blankets that are too big for your bed. They should not hang down onto the floor.  Have a firm chair that has side arms. You can use this for support while you get dressed.  Do not have throw rugs and other things on the floor that can make you trip. What can I do in the kitchen?  Clean up any spills right away.  Avoid walking on wet floors.  Keep items that you use a lot in easy-to-reach places.  If you need to reach something above you, use a strong step stool that has a grab bar.  Keep electrical cords out of the way.  Do not use floor polish or wax that makes floors slippery. If you must use wax, use non-skid  floor wax.  Do not have throw rugs and other things on the floor that can make you trip. What can I do with my stairs?  Do not leave any items on the stairs.  Make sure that there are handrails on both sides of the stairs and use them. Fix handrails that are broken or loose. Make sure that handrails are as long as the stairways.  Check any carpeting to make sure that it is firmly attached to the stairs. Fix any carpet that is loose or worn.  Avoid having throw rugs at the top or bottom of the stairs. If you do have throw rugs, attach them to the floor with carpet tape.  Make sure that you have a light switch at the top of the stairs and the bottom of the stairs. If you do not have them, ask someone to add them for you. What else can I do to help prevent falls?  Wear shoes that:  Do not have high heels.  Have rubber bottoms.  Are comfortable and fit you well.  Are closed at the toe. Do not wear sandals.  If you  use a stepladder:  Make sure that it is fully opened. Do not climb a closed stepladder.  Make sure that both sides of the stepladder are locked into place.  Ask someone to hold it for you, if possible.  Clearly mark and make sure that you can see:  Any grab bars or handrails.  First and last steps.  Where the edge of each step is.  Use tools that help you move around (mobility aids) if they are needed. These include:  Canes.  Walkers.  Scooters.  Crutches.  Turn on the lights when you go into a dark area. Replace any light bulbs as soon as they burn out.  Set up your furniture so you have a clear path. Avoid moving your furniture around.  If any of your floors are uneven, fix them.  If there are any pets around you, be aware of where they are.  Review your medicines with your doctor. Some medicines can make you feel dizzy. This can increase your chance of falling. Ask your doctor what other things that you can do to help prevent falls. This information is not intended to replace advice given to you by your health care provider. Make sure you discuss any questions you have with your health care provider. Document Released: 04/23/2009 Document Revised: 12/03/2015 Document Reviewed: 08/01/2014 Elsevier Interactive Patient Education  2017 [employer].

## 2020-01-29 NOTE — Progress Notes (Signed)
Subjective:   Laurie Mays is a 79 y.o. female who presents for Medicare Annual (Subsequent) preventive examination.  Review of Systems     Cardiac Risk Factors include: advanced age (>85men, >82 women);hypertension;family history of premature cardiovascular disease;dyslipidemia;obesity (BMI >30kg/m2)     Objective:    Today's Vitals   01/29/20 1003  BP: 126/60  Pulse: 76  Temp: (!) 96.8 F (36 C)  TempSrc: Temporal  SpO2: 99%  Weight: 235 lb (106.6 kg)  Height: 5\' 4"  (1.626 m)   Body mass index is 40.34 kg/m.  Advanced Directives 01/29/2020 08/26/2019 01/28/2019 11/21/2018 05/23/2018 01/19/2018 01/19/2018  Does Patient Have a Medical Advance Directive? Yes Yes Yes Yes Yes Yes Yes  Type of 03/22/2018 of [place];Living will Healthcare Power of [place] Power of [employer] Power of [employer] Power of [employer] Power of Attorney Healthcare Power of Attorney  Does patient want to make changes to medical advance directive? No - Patient declined No - Patient declined No - Guardian declined No - Patient declined - No - Patient declined No - Patient declined  Copy of Healthcare Power of Attorney in Chart? Yes - validated most recent copy scanned in chart (See row information) Yes - validated most recent copy scanned in chart (See row information) Yes - validated most recent copy scanned in chart (See row information) - - Yes Yes    Current Medications (verified) Outpatient Encounter Medications as of 01/29/2020  Medication Sig  . acetaminophen (TYLENOL) 325 MG tablet Take 650 mg by mouth daily.   . Ascorbic Acid (VITAMIN C) 1000 MG tablet Take 1 tablet (1,000 mg total) by mouth daily.  01/31/2020 aspirin EC 81 MG tablet Take 81 mg by mouth daily.  Marland Kitchen atorvastatin (LIPITOR) 40 MG tablet TAKE 1 TABLET BY MOUTH EVERY DAY  . busPIRone (BUSPAR) 5 MG tablet TAKE 1 TABLET BY MOUTH TWICE A DAY AS NEEDED  . cholecalciferol (VITAMIN D3) 25  MCG (1000 UNIT) tablet Take 1 tablet (1,000 Units total) by mouth daily.  . citalopram (CELEXA) 20 MG tablet TAKE 1 TABLET BY MOUTH EVERY DAY  . diclofenac sodium (VOLTAREN) 1 % GEL Apply 4 g topically 4 (four) times daily. To both knees. May apply 2 gm to hands QID for arthritis  . donepezil (ARICEPT) 10 MG tablet TAKE 1 TABLET BY MOUTH EVERYDAY AT BEDTIME  . doxycycline (VIBRA-TABS) 100 MG tablet Take 1 tablet (100 mg total) by mouth 2 (two) times daily.  . Ensure (ENSURE) Take 237 mLs by mouth daily.  . furosemide (LASIX) 20 MG tablet TAKE 1 TABLET BY MOUTH DAILY AS NEEDED  . saccharomyces boulardii (FLORASTOR) 250 MG capsule Take 1 capsule (250 mg total) by mouth 2 (two) times daily.  . traZODone (DESYREL) 50 MG tablet TAKE 1 TABLET BY MOUTH EVERY DAY IN THE MORNING AND 1/2 TAB IN THE EVENING  . potassium chloride 20 MEQ TBCR Take 20 mEq by mouth daily for 2 days.   No facility-administered encounter medications on file as of 01/29/2020.    Allergies (verified) Lisinopril, Losartan, Memantine, Peanut-containing drug products, and Tramadol   History: Past Medical History:  Diagnosis Date  . Gallbladder disorder 2009   Per New Patient Packet   . Heart disease   . High blood pressure    Per New Patient Packet   . High cholesterol    Past Surgical History:  Procedure Laterality Date  . ABDOMINAL HYSTERECTOMY     Per New Patient  Packet   . CARDIAC SURGERY  2011   Open Heart Surgey, Per New Patient Packet    . REPLACEMENT TOTAL KNEE Left 2010   Per New Patient Packet    Family History  Problem Relation Age of Onset  . Dementia Mother   . Arthritis Mother   . Breast cancer Mother   . Heart attack Father   . Arthritis Father   . Heart attack Sister   . Arthritis Sister   . Breast cancer Sister    Social History   Socioeconomic History  . Marital status: Married    Spouse name: Not on file  . Number of children: Not on file  . Years of education: Not on file  . Highest  education level: Not on file  Occupational History  . Not on file  Tobacco Use  . Smoking status: Former Smoker    Quit date: 07/12/1987    Years since quitting: 32.5  . Smokeless tobacco: Never Used  . Tobacco comment: Quit about 30 years ago as of 2019   Vaping Use  . Vaping Use: Never used  Substance and Sexual Activity  . Alcohol use: Never  . Drug use: Not Currently  . Sexual activity: Not on file  Other Topics Concern  . Not on file  Social History Narrative   Diet: N/A      Caffeine: Coffee and Pepsi      Married, if yes what year: Yes, 1961      Do you live in a house, apartment, assisted living, condo, trailer, ect: House, one stories, 1 person       Pets: None      Current/Past profession: Geologist, engineering       Exercise: No          Living Will: Yes   DNR:   POA/HPOA:      Functional Status:   Do you have difficulty bathing or dressing yourself? No   Do you have difficulty preparing food or eating? Yes   Do you have difficulty managing your medications? Yes    Do you have difficulty managing your finances? Yes    Do you have difficulty affording your medications? No   Social Determinants of Health   Financial Resource Strain:   . Difficulty of Paying Living Expenses:   Food Insecurity:   . Worried About Programme researcher, broadcasting/film/video in the Last Year:   . Barista in the Last Year:   Transportation Needs:   . Freight forwarder (Medical):   Marland Kitchen Lack of Transportation (Non-Medical):   Physical Activity:   . Days of Exercise per Week:   . Minutes of Exercise per Session:   Stress:   . Feeling of Stress :   Social Connections:   . Frequency of Communication with Friends and Family:   . Frequency of Social Gatherings with Friends and Family:   . Attends Religious Services:   . Active Member of Clubs or Organizations:   . Attends Banker Meetings:   Marland Kitchen Marital Status:     Tobacco Counseling Counseling given: Not Answered Comment:  Quit about 30 years ago as of 2019    Clinical Intake:  Pre-visit preparation completed: Yes  Pain : No/denies pain     BMI - recorded: 40.34 Nutritional Status: BMI > 30  Obese Diabetes: No  How often do you need to have someone help you when you read instructions, pamphlets, or other written materials from your  doctor or pharmacy?: 4 - Often  Diabetic?no         Activities of Daily Living In your present state of health, do you have any difficulty performing the following activities: 01/29/2020  Hearing? Y  Vision? N  Difficulty concentrating or making decisions? Y  Walking or climbing stairs? 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 N  Doing errands, shopping? Y  Comment husband or family goes with her  Preparing Food and eating ? N  Using the Toilet? N  In the past six months, have you accidently leaked urine? N  Managing your Medications? Y  Comment family helps  Managing your Finances? Y  Comment family helps  Housekeeping or managing your Housekeeping? Y  Comment family helps  Some recent data might be hidden    Patient Care Team: [name], NP as PCP - General (Geriatric Medicine) [name], MD as PCP - Cardiology (Cardiology)  Indicate any recent Medical Services you may have received from other than [facility] providers in the past year (date may be approximate).     Assessment:   This is a routine wellness examination for [place].  Hearing/Vision screen  Hearing Screening             Right ear:           Left ear:           Comments: Patient with hearing loss, states "I need hearing aids."    Visual Acuity Screening   Right eye Left eye Both eyes  Without correction:  With correction:     Comments: No exam within the last year    Dietary issues and exercise activities discussed: Current Exercise Habits: The patient does not participate  in regular exercise at present, Exercise limited by: orthopedic condition(s)  Goals    . Weight (lb) < 200 lb (90.7 kg)     To lose weight with healthy food choices       Depression Screen PHQ 2/9 Scores 01/29/2020 08/16/2019 01/28/2019 11/21/2018 01/19/2018 01/19/2018  PHQ - 2 Score 0 0 0 0 0 0    Fall Risk Fall Risk  01/29/2020 10/23/2019 08/26/2019 08/16/2019 01/28/2019  Falls in the past year? 0 0 0 0 0  Number falls in past yr: 0 0 0 0 0  Injury with Fall? 0 0 0 0 0    Any stairs in or around the home? No  If so, are there any without handrails? No  Home free of loose throw rugs in walkways, pet beds, electrical cords, etc? Yes  Adequate lighting in your home to reduce risk of falls? No   ASSISTIVE DEVICES UTILIZED TO PREVENT FALLS:  Life alert? No  Use of a cane, walker or w/c? Yes  Grab bars in the bathroom? No  Shower chair or bench in shower? Yes  Elevated toilet seat or a handicapped toilet? Yes   TIMED UP AND GO:  Was the test performed? No .  Length of time to ambulate 10 feet:  Gait slow and steady without use of assistive device  Cognitive Function: MMSE - Mini Mental State Exam 01/29/2020 01/19/2018  Orientation to time 1 1  Orientation to Place 4 3  Registration 3 3  Attention/ Calculation 2 5  Recall 0 0  Language- name 2 objects 2 2  Language- repeat 1 1  Language- follow 3 step command 3 3  Language- read & follow direction 1 1  Write a sentence 1 1  Copy design 0 0  Total score 18 20     6CIT Screen 01/28/2019  What Year? 0 points  What month? 0 points  What time? 0 points  Count back from 20 2 points  Months in reverse 0 points  Repeat phrase 4 points  Total Score 6    Immunizations Immunization History  Administered Date(s) Administered  . Fluad Quad(high Dose 65+) 04/24/2019  . Influenza, High Dose Seasonal PF 07/11/1968, 05/25/2017, 05/23/2018  . Pneumococcal Conjugate-13 01/19/2018  . Tdap 07/11/1988    Needs to get TDAP at  pharmacy Flu Vaccine status: Up to date Pneumococcal vaccine status: Completed during today's visit. Covid-19 vaccine status: Completed vaccines  Qualifies for Shingles Vaccine? Yes   Zostavax completed No   Shingrix Completed?: No.    Education has been provided regarding the importance of this vaccine. Patient has been advised to call insurance company to determine out of pocket expense if they have not yet received this vaccine. Advised may also receive vaccine at local pharmacy or Health Dept. Verbalized acceptance and understanding.  Screening Tests Health Maintenance  Topic Date Due  . COVID-19 Vaccine (1) Never done  . TETANUS/TDAP  07/11/1998  . DEXA SCAN  Never done  . PNA vac Low Risk Adult (2 of 2 - PPSV23) 01/20/2019  . INFLUENZA VACCINE  02/09/2020    Health Maintenance  Health Maintenance Due  Topic Date Due  . COVID-19 Vaccine (1) Never done  . TETANUS/TDAP  07/11/1998  . DEXA SCAN  Never done  . PNA vac Low Risk Adult (2 of 2 - PPSV23) 01/20/2019    Colorectal cancer screening: No longer required.  Mammogram status: No longer required.  Bone Density status: Ordered today. Pt provided with contact info and advised to call to schedule appt.  Lung Cancer Screening: (Low Dose CT Chest recommended if Age 77-80 years, 30 pack-year currently smoking OR have quit w/in 15years.) does not qualify.   Lung Cancer Screening Referral:   Additional Screening:  Hepatitis C Screening: does not qualify; Completed na  Vision Screening: Recommended annual ophthalmology exams for early detection of glaucoma and other disorders of the eye. Is the patient up to date with their annual eye exam?  No  Who is the provider or what is the name of the office in which the patient attends annual eye exams? [facility] If pt is not established with a provider, would they like to be referred to a provider to establish care? No .   Dental Screening: Recommended annual dental  exams for proper oral hygiene  Community Resource Referral / Chronic Care Management: CRR required this visit?  No   CCM required this visit?  No      Plan:     I have personally reviewed and noted the following in the patient's chart:   . Medical and social history . Use of alcohol, tobacco or illicit drugs  . Current medications and supplements . Functional ability and status . Nutritional status . Physical activity . Advanced directives . List of other physicians . Hospitalizations, surgeries, and ER visits in previous 12 months . Vitals . Screenings to include cognitive, depression, and falls . Referrals and appointments  In addition, I have reviewed and discussed with patient certain preventive protocols, quality metrics, and best practice recommendations. A written personalized care plan for preventive services as well as general preventive health recommendations were provided to patient.     Shanda BumpsJessica  Idolina Primer, NP   01/29/2020

## 2020-01-30 ENCOUNTER — Telehealth: Payer: Self-pay

## 2020-01-30 DIAGNOSIS — M6530 Trigger finger, unspecified finger: Secondary | ICD-10-CM

## 2020-01-30 NOTE — Telephone Encounter (Addendum)
Patient's daughter, Shawna Orleans, is requesting a referral to Ortho for trigger finger.  She requests to see [facility] on [address], did not specify a provider.   I pended a referral, but not sure of diagnosis code.

## 2020-04-06 ENCOUNTER — Ambulatory Visit: Payer: Medicare PPO | Admitting: Podiatry

## 2020-04-06 ENCOUNTER — Telehealth: Payer: Self-pay | Admitting: Nurse Practitioner

## 2020-04-06 NOTE — Telephone Encounter (Signed)
pts daughter(Melanie) called saying they've seen some changes with her mom & ready for referral to Neurology.  Wants to see Dr Naomie Dean   Thanks,  Rosezella [place]

## 2020-04-06 NOTE — Telephone Encounter (Signed)
Pt has not had recent follow up and does not have one on file. Let have her come in for routine follow up and can make referral at this time.

## 2020-04-09 ENCOUNTER — Ambulatory Visit: Payer: Medicare PPO | Admitting: Podiatry

## 2020-04-10 ENCOUNTER — Ambulatory Visit (INDEPENDENT_AMBULATORY_CARE_PROVIDER_SITE_OTHER): Payer: Medicare PPO | Admitting: Nurse Practitioner

## 2020-04-10 ENCOUNTER — Other Ambulatory Visit: Payer: Self-pay

## 2020-04-10 ENCOUNTER — Encounter: Payer: Self-pay | Admitting: Nurse Practitioner

## 2020-04-10 VITALS — BP 150/70 | HR 53 | Temp 98.0°F | Ht 64.0 in | Wt 236.6 lb

## 2020-04-10 DIAGNOSIS — F0390 Unspecified dementia without behavioral disturbance: Secondary | ICD-10-CM

## 2020-04-10 DIAGNOSIS — N3941 Urge incontinence: Secondary | ICD-10-CM

## 2020-04-10 DIAGNOSIS — F419 Anxiety disorder, unspecified: Secondary | ICD-10-CM

## 2020-04-10 DIAGNOSIS — Z23 Encounter for immunization: Secondary | ICD-10-CM | POA: Diagnosis not present

## 2020-04-10 DIAGNOSIS — E782 Mixed hyperlipidemia: Secondary | ICD-10-CM

## 2020-04-10 DIAGNOSIS — I10 Essential (primary) hypertension: Secondary | ICD-10-CM

## 2020-04-10 DIAGNOSIS — I2510 Atherosclerotic heart disease of native coronary artery without angina pectoris: Secondary | ICD-10-CM

## 2020-04-10 DIAGNOSIS — Z6841 Body Mass Index (BMI) 40.0 and over, adult: Secondary | ICD-10-CM

## 2020-04-10 MED ORDER — BUSPIRONE HCL 10 MG PO TABS: 10.0000 mg | ORAL_TABLET | Freq: Two times a day (BID) | ORAL | 1 refills | Status: DC

## 2020-04-10 MED ORDER — INCONTINENCE SUPPLY DISPOSABLE MISC: 11 refills | Status: DC

## 2020-04-10 NOTE — Progress Notes (Signed)
Careteam: Patient Care Team: [name], NP as PCP - General (Geriatric Medicine) [name], MD as PCP - Cardiology (Cardiology)  PLACE OF SERVICE:  [facility]  Advanced Directive information    Allergies  Allergen Reactions  . Lisinopril Other (See Comments)  . Losartan Other (See Comments)  . Memantine Other (See Comments)    combative  . Peanut-Containing Drug Products   . Tramadol Hives    Chief Complaint  Patient presents with  . Acute Visit    Referral to neurologist. Patient has been depressed, combative with husband. Patient cries often.     HPI: Patient is a 79 y.o. female due to dementia  Always on the "go"  Reports she;s always on 100 Reports occasionally combative.  She is sleeping well at this time. On trazodone at bedtime  Anxiety- continues on celexa, trazodone and buspar She is taking Buspar twice daily routinely- doing better at night but very anxious during the day. Continues on celexa 20 mg daily   Hypertension- elevated today, had pork chop prior to visit today  CAD- not on asa, continues on lipitor  No chest pain reported  Eats a lot of cookies, drinks coffee and sodas frequently per daughter.    Review of Systems:  Review of Systems  Constitutional: Negative for chills, fever and weight loss.  HENT: Negative for tinnitus.   Respiratory: Negative for cough, sputum production and shortness of breath.   Cardiovascular: Negative for chest pain, palpitations and leg swelling.  Gastrointestinal: Negative for abdominal pain, constipation, diarrhea and heartburn.  Genitourinary: Negative for dysuria, frequency and urgency.  Musculoskeletal: Negative for back pain, falls, joint pain and myalgias.  Skin: Negative.   Neurological: Negative for dizziness and headaches.  Psychiatric/Behavioral: Positive for memory loss. Negative for depression. The patient is nervous/anxious. The patient does not have insomnia.     Past  Medical History:  Diagnosis Date  . Gallbladder disorder 2009   Per New Patient Packet   . Heart disease   . High blood pressure    Per New Patient Packet   . High cholesterol    Past Surgical History:  Procedure Laterality Date  . ABDOMINAL HYSTERECTOMY     Per New Patient Packet   . CARDIAC SURGERY  2011   Open Heart Surgey, Per New Patient Packet    . REPLACEMENT TOTAL KNEE Left 2010   Per New Patient Packet    Social History:   reports that she quit smoking about 32 years ago. She has never used smokeless tobacco. She reports previous drug use. She reports that she does not drink alcohol.  Family History  Problem Relation Age of Onset  . Dementia Mother   . Arthritis Mother   . Breast cancer Mother   . Heart attack Father   . Arthritis Father   . Heart attack Sister   . Arthritis Sister   . Breast cancer Sister     Medications: Patient's Medications  New Prescriptions   No medications on file  Previous Medications   ACETAMINOPHEN (TYLENOL) 325 MG TABLET    Take 650 mg by mouth daily.    ASCORBIC ACID (VITAMIN C) 1000 MG TABLET    Take 1 tablet (1,000 mg total) by mouth daily.   ATORVASTATIN (LIPITOR) 40 MG TABLET    TAKE 1 TABLET BY MOUTH EVERY DAY   BUSPIRONE (BUSPAR) 5 MG TABLET    TAKE 1 TABLET BY MOUTH TWICE A DAY AS NEEDED   CHOLECALCIFEROL (  VITAMIN D3) 25 MCG (1000 UNIT) TABLET    Take 1 tablet (1,000 Units total) by mouth daily.   CITALOPRAM (CELEXA) 20 MG TABLET    TAKE 1 TABLET BY MOUTH EVERY DAY   DICLOFENAC SODIUM (VOLTAREN) 1 % GEL    Apply 4 g topically 4 (four) times daily. To both knees. May apply 2 gm to hands QID for arthritis   DONEPEZIL (ARICEPT) 10 MG TABLET    TAKE 1 TABLET BY MOUTH EVERYDAY AT BEDTIME   ENSURE (ENSURE)    Take 237 mLs by mouth daily.   FUROSEMIDE (LASIX) 20 MG TABLET    TAKE 1 TABLET BY MOUTH DAILY AS NEEDED   POTASSIUM CHLORIDE 20 MEQ TBCR    Take 20 mEq by mouth daily for 2 days.   SACCHAROMYCES BOULARDII (FLORASTOR) 250 MG  CAPSULE    Take 1 capsule (250 mg total) by mouth 2 (two) times daily.   TRAZODONE (DESYREL) 50 MG TABLET    TAKE 1 TABLET BY MOUTH EVERY DAY IN THE MORNING AND 1/2 TAB IN THE EVENING  Modified Medications   No medications on file  Discontinued Medications   ASPIRIN EC 81 MG TABLET    Take 81 mg by mouth daily.   DOXYCYCLINE (VIBRA-TABS) 100 MG TABLET    Take 1 tablet (100 mg total) by mouth 2 (two) times daily.    Physical Exam:  Vitals:   04/10/20 1555  BP: (!) 150/70  Pulse: (!) 53  Temp: 98 F (36.7 C)  SpO2: 98%  Weight: 236 lb 9.6 oz (107.3 kg)  Height: 5\' 4"  (1.626 m)   Body mass index is 40.61 kg/m. Wt Readings from Last 3 Encounters:  04/10/20 236 lb 9.6 oz (107.3 kg)  01/29/20 235 lb (106.6 kg)  10/23/19 221 lb (100.2 kg)    Physical Exam Constitutional:      General: She is not in acute distress.    Appearance: She is well-developed. She is not diaphoretic.  HENT:     Head: Normocephalic and atraumatic.     Mouth/Throat:     Pharynx: No oropharyngeal exudate.  Eyes:     Conjunctiva/sclera: Conjunctivae normal.     Pupils: Pupils are equal, round, and reactive to light.  Cardiovascular:     Rate and Rhythm: Normal rate and regular rhythm.     Heart sounds: Normal heart sounds.  Pulmonary:     Effort: Pulmonary effort is normal.     Breath sounds: Normal breath sounds.  Abdominal:     General: Bowel sounds are normal.     Palpations: Abdomen is soft.  Musculoskeletal:        General: No tenderness.     Cervical back: Normal range of motion and neck supple.  Skin:    General: Skin is warm and dry.  Neurological:     Mental Status: She is alert and oriented to person, place, and time.     Labs reviewed: Basic Metabolic Panel: Recent Labs    08/26/19 1553  NA 140  K 3.3*  CL 104  CO2 27  GLUCOSE 88  BUN 6*  CREATININE 0.89*  CALCIUM 9.4   Liver Function Tests: Recent Labs    08/26/19 1553  AST 16  ALT 9  BILITOT 0.5  PROT 6.7    No results for input(s): LIPASE, AMYLASE in the last 8760 hours. No results for input(s): AMMONIA in the last 8760 hours. CBC: Recent Labs    08/26/19 1553  WBC 4.8  NEUTROABS 2,434  HGB 12.8  HCT 39.6  MCV 85.3  PLT 252   Lipid Panel: Recent Labs    08/26/19 1553  CHOL 119  HDL 39*  LDLCALC 63  TRIG 85  CHOLHDL 3.1   TSH: No results for input(s): TSH in the last 8760 hours. A1C: No results found for: HGBA1C   Assessment/Plan 1. Anxiety -ongoing, has improved with adding buspar but not at goal. wil increase buspar to 10 mg BID, continues on celexa and trazodone as well.  Also encouraged dietary changes, to limit caffeine and sugar at this time.  - busPIRone (BUSPAR) 10 MG tablet; Take 1 tablet (10 mg total) by mouth 2 (two) times daily.  Dispense: 60 tablet; Refill: 1  2. Dementia without behavioral disturbance, unspecified dementia type (HCC) Slow progressive decline, she does have increase in anxiety associated with dementia.  Continues on aricept 10 mg daily. Consider adding namenda at next follow up appt.   3. Mixed hyperlipidemia Continues on lipitor, LDL at goal on last labs.  - COMPLETE METABOLIC PANEL WITH GFR - CBC with Differential/Platelet  4. Need for influenza vaccination - Flu Vaccine QUAD High Dose(Fluad)  5. Urge incontinence of urine - Incontinence Supply Disposable MISC; XL incontinence brief to use as directed Dx: incontinence of urine  Dispense: 120 each; Refill: 11  6. Essential hypertension -encouraged dietary modifications at this time. To take bp at home, goal <140/90  7. Morbid obesity with BMI of 40.0-44.9, adult (HCC) -encouraged dietary changes, limit cookies, sugar free, caffeine free sodas.   8. Coronary artery disease involving native coronary artery of native heart without angina pectoris To make sure she is taking ASA 81 mg daily  Next appt: 4 months.  Janene Harvey. Biagio Borg  [facility] [phone]

## 2020-04-10 NOTE — Patient Instructions (Addendum)
Increase buspar to 10 mg twice daily - can give when she first wakes up and second dose in the afternoon/evening   To cut back on caffeine   Would recommend decaf coffee and decaf sodas   Check blood pressure at home

## 2020-04-11 LAB — CBC WITH DIFFERENTIAL/PLATELET
Absolute Monocytes: 429 cells/uL (ref 200–950)
Basophils Absolute: 42 cells/uL (ref 0–200)
Basophils Relative: 0.8 %
Eosinophils Absolute: 133 cells/uL (ref 15–500)
Eosinophils Relative: 2.5 %
HCT: 41.7 % (ref 35.0–45.0)
Hemoglobin: 13.2 g/dL (ref 11.7–15.5)
Lymphs Abs: 1802 cells/uL (ref 850–3900)
MCH: 27 pg (ref 27.0–33.0)
MCHC: 31.7 g/dL — ABNORMAL LOW (ref 32.0–36.0)
MCV: 85.5 fL (ref 80.0–100.0)
MPV: 10 fL (ref 7.5–12.5)
Monocytes Relative: 8.1 %
Neutro Abs: 2894 cells/uL (ref 1500–7800)
Neutrophils Relative %: 54.6 %
Platelets: 210 10*3/uL (ref 140–400)
RBC: 4.88 10*6/uL (ref 3.80–5.10)
RDW: 13.6 % (ref 11.0–15.0)
Total Lymphocyte: 34 %
WBC: 5.3 10*3/uL (ref 3.8–10.8)

## 2020-04-11 LAB — COMPLETE METABOLIC PANEL WITH GFR
AG Ratio: 1.2 (calc) (ref 1.0–2.5)
ALT: 10 U/L (ref 6–29)
AST: 13 U/L (ref 10–35)
Albumin: 3.7 g/dL (ref 3.6–5.1)
Alkaline phosphatase (APISO): 75 U/L (ref 37–153)
BUN/Creatinine Ratio: 10 (calc) (ref 6–22)
BUN: 12 mg/dL (ref 7–25)
CO2: 26 mmol/L (ref 20–32)
Calcium: 9.2 mg/dL (ref 8.6–10.4)
Chloride: 103 mmol/L (ref 98–110)
Creat: 1.18 mg/dL — ABNORMAL HIGH (ref 0.60–0.88)
GFR, Est African American: 50 mL/min/{1.73_m2} — ABNORMAL LOW (ref >=60)
GFR, Est Non African American: 43 mL/min/{1.73_m2} — ABNORMAL LOW (ref >=60)
Globulin: 3.2 g/dL (calc) (ref 1.9–3.7)
Glucose, Bld: 96 mg/dL (ref 65–139)
Potassium: 3.7 mmol/L (ref 3.5–5.3)
Sodium: 138 mmol/L (ref 135–146)
Total Bilirubin: 0.4 mg/dL (ref 0.2–1.2)
Total Protein: 6.9 g/dL (ref 6.1–8.1)

## 2020-04-13 ENCOUNTER — Ambulatory Visit (INDEPENDENT_AMBULATORY_CARE_PROVIDER_SITE_OTHER): Payer: Medicare PPO | Admitting: Podiatry

## 2020-04-13 ENCOUNTER — Other Ambulatory Visit: Payer: Self-pay

## 2020-04-13 ENCOUNTER — Encounter: Payer: Self-pay | Admitting: Podiatry

## 2020-04-13 DIAGNOSIS — L84 Corns and callosities: Secondary | ICD-10-CM | POA: Diagnosis not present

## 2020-04-13 DIAGNOSIS — N183 Chronic kidney disease, stage 3 unspecified: Secondary | ICD-10-CM | POA: Diagnosis not present

## 2020-04-13 DIAGNOSIS — B351 Tinea unguium: Secondary | ICD-10-CM | POA: Diagnosis not present

## 2020-04-13 NOTE — Progress Notes (Signed)
°  Subjective:  Patient ID: Laurie Mays, female    DOB: Jan 05, 1941,  MRN: [id number]  Chief Complaint  Patient presents with   Nail Problem    trim nails   79 y.o. female presents with the above complaint. History confirmed with patient. Denies new isuses.  Objective:  Physical Exam: warm, good capillary refill, nail exam onychomycosis of the toenails, no trophic changes or ulcerative lesions. DP pulses palpable, PT pulses palpable and protective sensation intact. HPK submet 5 bilat, right hallux  No images are attached to the encounter.  Assessment:   1. Onychomycosis   2. Stage 3 chronic kidney disease, unspecified whether stage 3a or 3b CKD (HCC)   3. Callus    Plan:  Patient was evaluated and treated and all questions answered.  Onychomycosis CKD -Nails and calluses debrided  Procedure: Nail Debridement Rationale: Patient meets criteria for routine foot care due to CKD Type of Debridement: manual, sharp debridement. Instrumentation: Nail nipper, rotary burr. Number of Nails: 10  Procedure: Paring of Lesion Rationale: painful hyperkeratotic lesion Type of Debridement: manual, sharp debridement. Instrumentation: 312 blade Number of Lesions: 3   Return in about 3 months (around 07/14/2020) for At Risk Foot Care - CKD.

## 2020-04-24 ENCOUNTER — Other Ambulatory Visit: Payer: Self-pay | Admitting: Nurse Practitioner

## 2020-04-24 DIAGNOSIS — F419 Anxiety disorder, unspecified: Secondary | ICD-10-CM

## 2020-04-28 ENCOUNTER — Other Ambulatory Visit: Payer: Self-pay | Admitting: Nurse Practitioner

## 2020-04-28 DIAGNOSIS — E782 Mixed hyperlipidemia: Secondary | ICD-10-CM

## 2020-06-09 ENCOUNTER — Other Ambulatory Visit: Payer: Self-pay | Admitting: Nurse Practitioner

## 2020-06-09 DIAGNOSIS — F32A Depression, unspecified: Secondary | ICD-10-CM

## 2020-06-09 DIAGNOSIS — G4700 Insomnia, unspecified: Secondary | ICD-10-CM

## 2020-06-09 DIAGNOSIS — F419 Anxiety disorder, unspecified: Secondary | ICD-10-CM

## 2020-06-09 NOTE — Telephone Encounter (Signed)
Is trazodone considered a controlled substance?   Last OV 04/10/20 Last labs 04/10/20 Last refilled 03/12/20 No contract

## 2020-06-09 NOTE — Telephone Encounter (Signed)
No, refill approved.

## 2020-06-14 ENCOUNTER — Other Ambulatory Visit: Payer: Self-pay | Admitting: Nurse Practitioner

## 2020-06-14 DIAGNOSIS — F0390 Unspecified dementia without behavioral disturbance: Secondary | ICD-10-CM

## 2020-06-19 ENCOUNTER — Other Ambulatory Visit: Payer: Self-pay | Admitting: Nurse Practitioner

## 2020-06-19 DIAGNOSIS — M2540 Effusion, unspecified joint: Secondary | ICD-10-CM

## 2020-07-10 ENCOUNTER — Other Ambulatory Visit: Payer: Self-pay | Admitting: Nurse Practitioner

## 2020-07-10 DIAGNOSIS — E782 Mixed hyperlipidemia: Secondary | ICD-10-CM

## 2020-07-13 NOTE — Telephone Encounter (Signed)
Left message on voicemail for patient to return call when available , reason for call: Patient needs to scheudle a routine follow-up with with [name], NP that was due in June 2021 with fasting labs same day

## 2020-07-15 ENCOUNTER — Encounter: Payer: Self-pay | Admitting: Nurse Practitioner

## 2020-07-15 ENCOUNTER — Other Ambulatory Visit: Payer: Self-pay

## 2020-07-15 ENCOUNTER — Ambulatory Visit (INDEPENDENT_AMBULATORY_CARE_PROVIDER_SITE_OTHER): Payer: Medicare PPO | Admitting: Nurse Practitioner

## 2020-07-15 VITALS — BP 118/62 | HR 56 | Temp 96.9°F | Ht 64.0 in | Wt 242.0 lb

## 2020-07-15 DIAGNOSIS — R8290 Unspecified abnormal findings in urine: Secondary | ICD-10-CM

## 2020-07-15 DIAGNOSIS — M170 Bilateral primary osteoarthritis of knee: Secondary | ICD-10-CM

## 2020-07-15 DIAGNOSIS — E782 Mixed hyperlipidemia: Secondary | ICD-10-CM | POA: Diagnosis not present

## 2020-07-15 DIAGNOSIS — Z23 Encounter for immunization: Secondary | ICD-10-CM

## 2020-07-15 DIAGNOSIS — I10 Essential (primary) hypertension: Secondary | ICD-10-CM | POA: Diagnosis not present

## 2020-07-15 DIAGNOSIS — F0390 Unspecified dementia without behavioral disturbance: Secondary | ICD-10-CM

## 2020-07-15 DIAGNOSIS — Z6841 Body Mass Index (BMI) 40.0 and over, adult: Secondary | ICD-10-CM

## 2020-07-15 DIAGNOSIS — I2510 Atherosclerotic heart disease of native coronary artery without angina pectoris: Secondary | ICD-10-CM

## 2020-07-15 DIAGNOSIS — R5381 Other malaise: Secondary | ICD-10-CM

## 2020-07-15 DIAGNOSIS — F3341 Major depressive disorder, recurrent, in partial remission: Secondary | ICD-10-CM

## 2020-07-15 LAB — POCT URINALYSIS DIPSTICK
Bilirubin, UA: NEGATIVE
Blood, UA: NEGATIVE
Glucose, UA: NEGATIVE
Ketones, UA: NEGATIVE
Leukocytes, UA: NEGATIVE
Nitrite, UA: NEGATIVE
Protein, UA: POSITIVE — AB
Spec Grav, UA: 1.015 (ref 1.010–1.025)
Urobilinogen, UA: 1 E.U./dL
pH, UA: 6 (ref 5.0–8.0)

## 2020-07-15 NOTE — Patient Instructions (Addendum)
To make sure you are staying hydrated  Limiting sodas and caffeine Decaf coffee, decaf sodas when drinking soda  Start ASA 81 mg by mouth daily   Increase physical activity you tube say yes to next - can subscribe walks  silver sneakers videos online.

## 2020-07-15 NOTE — Progress Notes (Signed)
Careteam: Patient Care Team: Lauree Chandler, NP as PCP - General (Geriatric Medicine) Buford Dresser, MD as PCP - Cardiology (Cardiology)  PLACE OF SERVICE:  [place]  Advanced Directive information    Allergies  Allergen Reactions  . Lisinopril Other (See Comments)  . Losartan Other (See Comments)  . Memantine Other (See Comments)    combative  . Peanut-Containing Drug Products   . Tramadol Hives    Chief Complaint  Patient presents with  . Acute Visit    Possible UTI. Laurie Mays with granddaughter, Laurie Mays     HPI: Patient is a 80 y.o. female for possible  Last week was more confused and slept a lot yesterday.  Granddaughter said Laurie Mays urine smelled foul and was frothy. Wanted to be safe so they brought Laurie Mays in for an evaluation and wanted to rule out UTI.   Dementia-ongoing, worsening confusion over the holidays with a lot going on.   Anxiety- overall controlled. Some days are worse than others. Continues on buspar 10 mg BID and celexa 20 mg daily   Depression- well controlled on trazodone and celexa.  Hyperlipidemia- not fasting today, continues on lipitor 40 mg daily  CAD- no chest pains, has not restarted ASA  Osteoarthritis of Bilateral knee- continues to complain of pain, impaired mobility. Taking tylenol     Review of Systems:  Review of Systems  Constitutional: Negative for chills, fever and weight loss.  HENT: Negative for tinnitus.   Respiratory: Negative for cough, sputum production and shortness of breath.   Cardiovascular: Negative for chest pain, palpitations and leg swelling.  Gastrointestinal: Negative for abdominal pain, constipation, diarrhea and heartburn.  Genitourinary: Negative for dysuria, frequency and urgency.  Musculoskeletal: Positive for joint pain. Negative for back pain, falls and myalgias.  Skin: Negative.   Neurological: Negative for dizziness and headaches.  Psychiatric/Behavioral: Positive for memory loss. Negative for  depression. The patient is nervous/anxious. The patient does not have insomnia.     Past Medical History:  Diagnosis Date  . Gallbladder disorder 2009   Per New Patient Packet   . Heart disease   . High blood pressure    Per New Patient Packet   . High cholesterol    Past Surgical History:  Procedure Laterality Date  . ABDOMINAL HYSTERECTOMY     Per New Patient Packet   . CARDIAC SURGERY  2011   Open Heart Surgey, Per New Patient Packet    . REPLACEMENT TOTAL KNEE Left 2010   Per New Patient Packet    Social History:   reports that she quit smoking about 33 years ago. She has never used smokeless tobacco. She reports previous drug use. She reports that she does not drink alcohol.  Family History  Problem Relation Age of Onset  . Dementia Mother   . Arthritis Mother   . Breast cancer Mother   . Heart attack Father   . Arthritis Father   . Heart attack Sister   . Arthritis Sister   . Breast cancer Sister     Medications: Patient's Medications  New Prescriptions   No medications on file  Previous Medications   ACETAMINOPHEN (TYLENOL) 325 MG TABLET    Take 650 mg by mouth daily.    ASCORBIC ACID (VITAMIN C) 1000 MG TABLET    Take 1 tablet (1,000 mg total) by mouth daily.   ATORVASTATIN (LIPITOR) 40 MG TABLET    TAKE 1 TABLET BY MOUTH EVERY DAY   BUSPIRONE (BUSPAR) 10 MG  TABLET    TAKE 1 TABLET BY MOUTH TWICE A DAY   CHOLECALCIFEROL (VITAMIN D3) 25 MCG (1000 UNIT) TABLET    Take 1 tablet (1,000 Units total) by mouth daily.   CITALOPRAM (CELEXA) 20 MG TABLET    TAKE 1 TABLET BY MOUTH EVERY DAY   DICLOFENAC SODIUM (VOLTAREN) 1 % GEL    Apply 4 g topically 4 (four) times daily. To both knees. May apply 2 gm to hands QID for arthritis   DONEPEZIL (ARICEPT) 10 MG TABLET    TAKE 1 TABLET BY MOUTH EVERYDAY AT BEDTIME   FUROSEMIDE (LASIX) 20 MG TABLET    TAKE 1 TABLET BY MOUTH EVERY DAY AS NEEDED   INCONTINENCE SUPPLY DISPOSABLE MISC    XL incontinence brief to use as directed  Dx: incontinence of urine   POTASSIUM CHLORIDE 20 MEQ TBCR    Take 20 mEq by mouth daily for 2 days.   TRAZODONE (DESYREL) 50 MG TABLET    TAKE 1 TABLET BY MOUTH EVERY DAY IN THE MORNING AND 1/2 TAB IN THE EVENING  Modified Medications   No medications on file  Discontinued Medications   No medications on file    Physical Exam:  Vitals:   07/15/20 1143  BP: 118/62  Pulse: (!) 56  Temp: (!) 96.9 F (36.1 C)  TempSrc: Temporal  SpO2: 97%  Weight: 242 lb (109.8 kg)  Height: $Remove'5\' 4"'UeRqaMw$  (1.626 m)   Body mass index is 41.54 kg/m. Wt Readings from Last 3 Encounters:  07/15/20 242 lb (109.8 kg)  04/10/20 236 lb 9.6 oz (107.3 kg)  01/29/20 235 lb (106.6 kg)    Physical Exam Constitutional:      General: She is not in acute distress.    Appearance: She is well-developed and well-nourished. She is not diaphoretic.  HENT:     Head: Normocephalic and atraumatic.     Mouth/Throat:     Mouth: Oropharynx is clear and moist.     Pharynx: No oropharyngeal exudate.  Eyes:     Conjunctiva/sclera: Conjunctivae normal.     Pupils: Pupils are equal, Mays, and reactive to light.  Cardiovascular:     Rate and Rhythm: Normal rate and regular rhythm.     Heart sounds: Normal heart sounds.  Pulmonary:     Effort: Pulmonary effort is normal.     Breath sounds: Normal breath sounds.  Abdominal:     General: Bowel sounds are normal.     Palpations: Abdomen is soft.  Musculoskeletal:        General: No tenderness or edema.     Cervical back: Normal range of motion and neck supple.  Skin:    General: Skin is warm and dry.  Neurological:     Mental Status: She is alert.     Gait: Gait abnormal.  Psychiatric:        Mood and Affect: Mood and affect and mood normal.     Labs reviewed: Basic Metabolic Panel: Recent Labs    08/26/19 1553 04/10/20 1622  NA 140 138  K 3.3* 3.7  CL 104 103  CO2 27 26  GLUCOSE 88 96  BUN 6* 12  CREATININE 0.89* 1.18*  CALCIUM 9.4 9.2   Liver Function  Tests: Recent Labs    08/26/19 1553 04/10/20 1622  AST 16 13  ALT 9 10  BILITOT 0.5 0.4  PROT 6.7 6.9   No results for input(s): LIPASE, AMYLASE in the last 8760 hours. No results for input(s):  AMMONIA in the last 8760 hours. CBC: Recent Labs    08/26/19 1553 04/10/20 1622  WBC 4.8 5.3  NEUTROABS 2,434 2,894  HGB 12.8 13.2  HCT 39.6 41.7  MCV 85.3 85.5  PLT 252 210   Lipid Panel: Recent Labs    08/26/19 1553  CHOL 119  HDL 39*  LDLCALC 63  TRIG 85  CHOLHDL 3.1   TSH: No results for input(s): TSH in the last 8760 hours. A1C: No results found for: HGBA1C   Assessment/Plan 1. Dementia without behavioral disturbance, unspecified dementia type ([place]) -progressive memory loss noted. Needs increase physical activity as she has become more immobile and debilitated. Continues on aricept 10 mg daily. If labs normal will add namenda to regimen.  - POC Urinalysis Dipstick - Ambulatory referral to [facility] [place]  2. Mixed hyperlipidemia -continues on Lipitor. Not fasting today but due for follow up lipids.  - CMP with eGFR([facility]) - Lipid Panel  3. Essential hypertension Controlled on current regimen.  - CMP with eGFR([facility]) - CBC with Differential/Platelet  4. Recurrent major depressive disorder, in partial remission (HCC) Stable on celexa. Also on trazodone which has been effective.   5. Morbid obesity with BMI of 40.0-44.9, adult ([place]) Ongoing, encouraged weight loss to help with OA of knees, mobility and healthy lifestyle through dietary modifications and increase in physical activity.   6. Coronary artery disease involving native coronary artery of native heart without angina pectoris Stable, without chest pains, to start ASA 81 mg daily.   7. Abnormal urine odor - POC Urinalysis Dipstick- normal, encouraged to increase hydration with more water.  8. Debility - Ambulatory referral to [place]. Need for pneumococcal vaccination - Pneumococcal  polysaccharide vaccine 23-valent greater than or equal to 2yo subcutaneous/IM  10. Primary osteoarthritis of both knees -continues on tylenol 1000 mg TID, worsening knee pain. Encouraged increase in mobility with weight loss. Home health PT would also be of great benefit.  - Ambulatory referral to [place]  Next appt: 4 months.  Carlos American. [place], [facility] [phone]

## 2020-07-16 ENCOUNTER — Ambulatory Visit: Payer: Medicare PPO | Admitting: Podiatry

## 2020-07-16 LAB — COMPLETE METABOLIC PANEL WITH GFR
AG Ratio: 1.2 (calc) (ref 1.0–2.5)
ALT: 10 U/L (ref 6–29)
AST: 16 U/L (ref 10–35)
Albumin: 3.8 g/dL (ref 3.6–5.1)
Alkaline phosphatase (APISO): 72 U/L (ref 37–153)
BUN/Creatinine Ratio: 8 (calc) (ref 6–22)
BUN: 8 mg/dL (ref 7–25)
CO2: 28 mmol/L (ref 20–32)
Calcium: 9.4 mg/dL (ref 8.6–10.4)
Chloride: 103 mmol/L (ref 98–110)
Creat: 1.04 mg/dL — ABNORMAL HIGH (ref 0.60–0.88)
GFR, Est African American: 58 mL/min/{1.73_m2} — ABNORMAL LOW (ref >=60)
GFR, Est Non African American: 50 mL/min/{1.73_m2} — ABNORMAL LOW (ref >=60)
Globulin: 3.1 g/dL (calc) (ref 1.9–3.7)
Glucose, Bld: 81 mg/dL (ref 65–99)
Potassium: 3.7 mmol/L (ref 3.5–5.3)
Sodium: 137 mmol/L (ref 135–146)
Total Bilirubin: 0.6 mg/dL (ref 0.2–1.2)
Total Protein: 6.9 g/dL (ref 6.1–8.1)

## 2020-07-16 LAB — LIPID PANEL
Cholesterol: 149 mg/dL (ref <200)
HDL: 55 mg/dL (ref >=50)
LDL Cholesterol (Calc): 79 mg/dL (calc)
Non-HDL Cholesterol (Calc): 94 mg/dL (calc) (ref <130)
Total CHOL/HDL Ratio: 2.7 (calc) (ref <5.0)
Triglycerides: 74 mg/dL (ref <150)

## 2020-07-16 LAB — CBC WITH DIFFERENTIAL/PLATELET
Absolute Monocytes: 473 cells/uL (ref 200–950)
Basophils Absolute: 41 cells/uL (ref 0–200)
Basophils Relative: 0.9 %
Eosinophils Absolute: 189 cells/uL (ref 15–500)
Eosinophils Relative: 4.2 %
HCT: 41.3 % (ref 35.0–45.0)
Hemoglobin: 13.4 g/dL (ref 11.7–15.5)
Lymphs Abs: 1719 cells/uL (ref 850–3900)
MCH: 27.3 pg (ref 27.0–33.0)
MCHC: 32.4 g/dL (ref 32.0–36.0)
MCV: 84.1 fL (ref 80.0–100.0)
MPV: 10.9 fL (ref 7.5–12.5)
Monocytes Relative: 10.5 %
Neutro Abs: 2079 cells/uL (ref 1500–7800)
Neutrophils Relative %: 46.2 %
Platelets: 181 10*3/uL (ref 140–400)
RBC: 4.91 10*6/uL (ref 3.80–5.10)
RDW: 13.9 % (ref 11.0–15.0)
Total Lymphocyte: 38.2 %
WBC: 4.5 10*3/uL (ref 3.8–10.8)

## 2020-07-20 ENCOUNTER — Ambulatory Visit: Payer: Medicare PPO | Admitting: Podiatry

## 2020-07-20 ENCOUNTER — Other Ambulatory Visit: Payer: Self-pay

## 2020-07-20 DIAGNOSIS — B351 Tinea unguium: Secondary | ICD-10-CM | POA: Diagnosis not present

## 2020-07-20 DIAGNOSIS — N183 Chronic kidney disease, stage 3 unspecified: Secondary | ICD-10-CM

## 2020-07-20 NOTE — Progress Notes (Signed)
  Subjective:  Patient ID: Laurie Mays, female    DOB: Nov 30, 1940,  MRN: [id number]  No chief complaint on file.  80 y.o. female presents with the above complaint. History confirmed with patient. Denies new isuses.  Objective:  Physical Exam: warm, good capillary refill, nail exam onychomycosis of the toenails, no trophic changes or ulcerative lesions. DP pulses palpable, PT pulses palpable and protective sensation intact. HPK submet 5 bilat, right hallux  No images are attached to the encounter.  Assessment:   1. Onychomycosis   2. Stage 3 chronic kidney disease, unspecified whether stage 3a or 3b CKD (HCC)    Plan:  Patient was evaluated and treated and all questions answered.  Onychomycosis  -Nails palliatively debrided secondary to pain  Procedure: Nail Debridement Type of Debridement: manual, sharp debridement. Instrumentation: Nail nipper, rotary burr. Number of Nails: 10  No follow-ups on file.

## 2020-07-22 ENCOUNTER — Other Ambulatory Visit: Payer: Self-pay | Admitting: Nurse Practitioner

## 2020-07-22 DIAGNOSIS — F32A Depression, unspecified: Secondary | ICD-10-CM

## 2020-07-22 DIAGNOSIS — F419 Anxiety disorder, unspecified: Secondary | ICD-10-CM

## 2020-07-23 NOTE — Telephone Encounter (Signed)
Patient has request refill on medication "Buspirone" and "Citalopram". Patient last refill for Citaloprm was 12/10/2019 with 90 tablets to be taken once daily and 1 additional refill. Last refill for Buspirone was 04/27/2020 with 180 tablets to be taken twice daily with no additional refills. Patient is due for refills. Medication has warning. Medication pend and sent to PCP [name], NP . Please Advise.

## 2020-08-03 DIAGNOSIS — F419 Anxiety disorder, unspecified: Secondary | ICD-10-CM

## 2020-08-03 DIAGNOSIS — I10 Essential (primary) hypertension: Secondary | ICD-10-CM

## 2020-08-03 DIAGNOSIS — M1711 Unilateral primary osteoarthritis, right knee: Secondary | ICD-10-CM

## 2020-08-03 DIAGNOSIS — E782 Mixed hyperlipidemia: Secondary | ICD-10-CM

## 2020-08-03 DIAGNOSIS — I2510 Atherosclerotic heart disease of native coronary artery without angina pectoris: Secondary | ICD-10-CM

## 2020-08-03 DIAGNOSIS — E7800 Pure hypercholesterolemia, unspecified: Secondary | ICD-10-CM

## 2020-08-03 DIAGNOSIS — F0390 Unspecified dementia without behavioral disturbance: Secondary | ICD-10-CM

## 2020-08-03 DIAGNOSIS — F3341 Major depressive disorder, recurrent, in partial remission: Secondary | ICD-10-CM

## 2020-09-17 ENCOUNTER — Other Ambulatory Visit: Payer: Self-pay | Admitting: Nurse Practitioner

## 2020-09-17 DIAGNOSIS — M2540 Effusion, unspecified joint: Secondary | ICD-10-CM

## 2020-09-22 ENCOUNTER — Telehealth: Payer: Self-pay

## 2020-09-22 ENCOUNTER — Other Ambulatory Visit: Payer: Self-pay

## 2020-09-22 ENCOUNTER — Telehealth (INDEPENDENT_AMBULATORY_CARE_PROVIDER_SITE_OTHER): Payer: Medicare PPO | Admitting: Nurse Practitioner

## 2020-09-22 DIAGNOSIS — M2550 Pain in unspecified joint: Secondary | ICD-10-CM

## 2020-09-22 NOTE — Progress Notes (Signed)
This service is provided via telemedicine  No vital signs collected/recorded due to the encounter was a telemedicine visit.   Location of patient (ex: home, work): Home  Patient consents to a telephone visit:  Yes, see encounter dated 09/22/2020  Location of the provider (ex: office, home): [facility]  Name of any referring provider:  N/A  Names of all persons participating in the telemedicine service and their role in the encounter: Abbey Chatters, Nurse Practitioner, Elveria Royals, CMA, patient and daughter, Shawna Orleans.  Time spent on call:  7 ,minutes with medical assistant

## 2020-09-22 NOTE — Telephone Encounter (Signed)
Ms. mayme, profeta are scheduled for a virtual visit with your provider today.    Just as we do with appointments in the office, we must obtain your consent to participate.  Your consent will be active for this visit and any virtual visit you may have with one of our providers in the next 365 days.    If you have a MyChart account, I can also send a copy of this consent to you electronically.  All virtual visits are billed to your insurance company just like a traditional visit in the office.  As this is a virtual visit, video technology does not allow for your provider to perform a traditional examination.  This may limit your provider's ability to fully assess your condition.  If your provider identifies any concerns that need to be evaluated in person or the need to arrange testing such as labs, EKG, etc, we will make arrangements to do so.    Although advances in technology are sophisticated, we cannot ensure that it will always work on either your end or our end.  If the connection with a video visit is poor, we may have to switch to a telephone visit.  With either a video or telephone visit, we are not always able to ensure that we have a secure connection.   I need to obtain your verbal consent now.   Are you willing to proceed with your visit today?   KRISTALYNN CODDINGTON has provided verbal consent on 09/22/2020 for a virtual visit (video or telephone).   Elveria Royals, [facility] 09/22/2020  8:33 AM

## 2020-09-22 NOTE — Progress Notes (Signed)
Careteam: Patient Care Team: [name], NP as PCP - General (Geriatric Medicine) [name], MD as PCP - Cardiology (Cardiology)  Advanced Directive information    Allergies  Allergen Reactions  . Lisinopril Other (See Comments)  . Losartan Other (See Comments)  . Memantine Other (See Comments)    combative  . Peanut-Containing Drug Products   . Tramadol Hives    Chief Complaint  Patient presents with  . Acute Visit    Left foot swollen. Still dragging foot. Daughter thinks it may be gout. Foot has been swollen since Sunday. Patient seems to still be dragging foot. Sunday she complained of some pain. The daughter rubbed it down with arthritis cream, kept it elevated and gave her Tylenol, which all seemed to help.      HPI: Patient is a 80 y.o. female for follow up on left foot.  Got well well this morning. Swelling down.  Was dragging but got up better this morning.  Questioned gout when it started due to the pain. Applied muscle cream which significantly helped.  She was doing PT and signed off.  Daughter is trying to do the exercises with her.  No gout diagnosis.  She does have OA and required prednisone in the past.  No redness, heat or discoloration.   She likes to ride in the car and husband can not get her out of the car. She will not get out because she likes to keep riding. Weaning coffee to decaf.    Review of Systems:  Review of Systems  Constitutional: Negative for chills and fever.  Musculoskeletal: Positive for joint pain and myalgias. Negative for falls.  Psychiatric/Behavioral: Positive for memory loss.    Past Medical History:  Diagnosis Date  . Gallbladder disorder 2009   Per New Patient Packet   . Heart disease   . High blood pressure    Per New Patient Packet   . High cholesterol    Past Surgical History:  Procedure Laterality Date  . ABDOMINAL HYSTERECTOMY     Per New Patient Packet   . CARDIAC SURGERY  2011    Open Heart Surgey, Per New Patient Packet    . REPLACEMENT TOTAL KNEE Left 2010   Per New Patient Packet    Social History:   reports that she quit smoking about 33 years ago. She has never used smokeless tobacco. She reports previous drug use. She reports that she does not drink alcohol.  Family History  Problem Relation Age of Onset  . Dementia Mother   . Arthritis Mother   . Breast cancer Mother   . Heart attack Father   . Arthritis Father   . Heart attack Sister   . Arthritis Sister   . Breast cancer Sister     Medications: Patient's Medications  New Prescriptions   No medications on file  Previous Medications   ACETAMINOPHEN (TYLENOL) 325 MG TABLET    Take 650 mg by mouth daily.    ASCORBIC ACID (VITAMIN C) 1000 MG TABLET    Take 1 tablet (1,000 mg total) by mouth daily.   ATORVASTATIN (LIPITOR) 40 MG TABLET    TAKE 1 TABLET BY MOUTH EVERY DAY   BUSPIRONE (BUSPAR) 10 MG TABLET    TAKE 1 TABLET BY MOUTH TWICE A DAY   CHOLECALCIFEROL (VITAMIN D3) 25 MCG (1000 UNIT) TABLET    Take 1 tablet (1,000 Units total) by mouth daily.   CITALOPRAM (CELEXA) 20 MG TABLET  TAKE 1 TABLET BY MOUTH EVERY DAY   DICLOFENAC SODIUM (VOLTAREN) 1 % GEL    Apply 4 g topically 4 (four) times daily. To both knees. May apply 2 gm to hands QID for arthritis   DONEPEZIL (ARICEPT) 10 MG TABLET    TAKE 1 TABLET BY MOUTH EVERYDAY AT BEDTIME   FUROSEMIDE (LASIX) 20 MG TABLET    TAKE 1 TABLET BY MOUTH EVERY DAY AS NEEDED   INCONTINENCE SUPPLY DISPOSABLE MISC    XL incontinence brief to use as directed Dx: incontinence of urine   TRAZODONE (DESYREL) 50 MG TABLET    TAKE 1 TABLET BY MOUTH EVERY DAY IN THE MORNING AND 1/2 TAB IN THE EVENING  Modified Medications   No medications on file  Discontinued Medications   POTASSIUM CHLORIDE 20 MEQ TBCR    Take 20 mEq by mouth daily for 2 days.    Physical Exam:  There were no vitals filed for this visit. There is no height or weight on file to calculate BMI. Wt  Readings from Last 3 Encounters:  07/15/20 242 lb (109.8 kg)  04/10/20 236 lb 9.6 oz (107.3 kg)  01/29/20 235 lb (106.6 kg)      Labs reviewed: Basic Metabolic Panel: Recent Labs    04/10/20 1622 07/15/20 1204  NA 138 137  K 3.7 3.7  CL 103 103  CO2 26 28  GLUCOSE 96 81  BUN 12 8  CREATININE 1.18* 1.04*  CALCIUM 9.2 9.4   Liver Function Tests: Recent Labs    04/10/20 1622 07/15/20 1204  AST 13 16  ALT 10 10  BILITOT 0.4 0.6  PROT 6.9 6.9   No results for input(s): LIPASE, AMYLASE in the last 8760 hours. No results for input(s): AMMONIA in the last 8760 hours. CBC: Recent Labs    04/10/20 1622 07/15/20 1204  WBC 5.3 4.5  NEUTROABS 2,894 2,079  HGB 13.2 13.4  HCT 41.7 41.3  MCV 85.5 84.1  PLT 210 181   Lipid Panel: Recent Labs    07/15/20 1204  CHOL 149  HDL 55  LDLCALC 79  TRIG 74  CHOLHDL 2.7   TSH: No results for input(s): TSH in the last 8760 hours. A1C: No results found for: HGBA1C   Assessment/Plan 1. Arthralgia of multiple joints -reassurance provided. To continue current regimen. Can add ice/cold pack to ankle if needed. Suspect swelling due to acute flare of arthritis. Seems to be improving.  Can also apply Voltaren gel QID after ice PRN.  Next appt: follow up for routine follow up in May, daughter will call and make appt.  Sooner If needed  Janene Harvey. Biagio Borg  [facility] [phone]    Virtual Visit via Earleen Reaper  I connected with patient on 09/22/20 at  8:30 AM EDT by video and verified that I am speaking with the correct person using two identifiers.  Location: Patient: home Provider: [facility]    I discussed the limitations, risks, security and privacy concerns of performing an evaluation and management service by telephone and the availability of in person appointments. I also discussed with the patient that there may be a patient responsible charge related to this service. The patient  expressed understanding and agreed to proceed.   I discussed the assessment and treatment plan with the patient. The patient was provided an opportunity to ask questions and all were answered. The patient agreed with the plan and demonstrated an understanding of the instructions.  The patient was advised to call back or seek an in-person evaluation if the symptoms worsen or if the condition fails to improve as anticipated.  I provided 15 minutes of non-face-to-face time during this encounter.  Janene Harvey. Biagio Borg Avs printed and mailed

## 2020-10-15 ENCOUNTER — Other Ambulatory Visit: Payer: Self-pay | Admitting: Nurse Practitioner

## 2020-10-15 DIAGNOSIS — E782 Mixed hyperlipidemia: Secondary | ICD-10-CM

## 2020-10-17 ENCOUNTER — Other Ambulatory Visit: Payer: Self-pay | Admitting: Nurse Practitioner

## 2020-10-17 DIAGNOSIS — F419 Anxiety disorder, unspecified: Secondary | ICD-10-CM

## 2020-10-19 ENCOUNTER — Ambulatory Visit (INDEPENDENT_AMBULATORY_CARE_PROVIDER_SITE_OTHER): Payer: Medicare PPO | Admitting: Podiatry

## 2020-10-19 ENCOUNTER — Other Ambulatory Visit: Payer: Self-pay

## 2020-10-19 DIAGNOSIS — N183 Chronic kidney disease, stage 3 unspecified: Secondary | ICD-10-CM

## 2020-10-19 DIAGNOSIS — B351 Tinea unguium: Secondary | ICD-10-CM | POA: Diagnosis not present

## 2020-10-19 NOTE — Progress Notes (Signed)
  Subjective:  Patient ID: Laurie Mays, female    DOB: 12-25-1940,  MRN: [id number]  Chief Complaint  Patient presents with  . debride    RFC   80 y.o. female presents with the above complaint. History confirmed with patient. Denies new isuses.  Objective:  Physical Exam: warm, good capillary refill, nail exam onychomycosis of the toenails, no trophic changes or ulcerative lesions. DP pulses palpable, PT pulses palpable and protective sensation intact. HPK submet 5 bilat, right hallux Assessment:   1. Onychomycosis   2. Stage 3 chronic kidney disease, unspecified whether stage 3a or 3b CKD (HCC)    Plan:  Patient was evaluated and treated and all questions answered.  Onychomycosis  -Nails debrided. Meets criteria for at risk foot care 2/2 CKD  Procedure: Nail Debridement Type of Debridement: manual, sharp debridement. Instrumentation: Nail nipper, rotary burr. Number of Nails: 10  Return in about 4 months (around 02/18/2021).

## 2020-11-11 ENCOUNTER — Encounter: Payer: Self-pay | Admitting: Nurse Practitioner

## 2020-11-11 ENCOUNTER — Ambulatory Visit (INDEPENDENT_AMBULATORY_CARE_PROVIDER_SITE_OTHER): Payer: Medicare PPO | Admitting: Nurse Practitioner

## 2020-11-11 ENCOUNTER — Other Ambulatory Visit: Payer: Self-pay

## 2020-11-11 VITALS — BP 130/70 | HR 62 | Temp 96.8°F | Ht 64.0 in | Wt 216.0 lb

## 2020-11-11 DIAGNOSIS — F0390 Unspecified dementia without behavioral disturbance: Secondary | ICD-10-CM | POA: Diagnosis not present

## 2020-11-11 DIAGNOSIS — E782 Mixed hyperlipidemia: Secondary | ICD-10-CM

## 2020-11-11 DIAGNOSIS — F3341 Major depressive disorder, recurrent, in partial remission: Secondary | ICD-10-CM

## 2020-11-11 DIAGNOSIS — I2510 Atherosclerotic heart disease of native coronary artery without angina pectoris: Secondary | ICD-10-CM

## 2020-11-11 DIAGNOSIS — I10 Essential (primary) hypertension: Secondary | ICD-10-CM

## 2020-11-11 DIAGNOSIS — F32A Depression, unspecified: Secondary | ICD-10-CM

## 2020-11-11 DIAGNOSIS — F419 Anxiety disorder, unspecified: Secondary | ICD-10-CM

## 2020-11-11 DIAGNOSIS — R634 Abnormal weight loss: Secondary | ICD-10-CM

## 2020-11-11 MED ORDER — CITALOPRAM HYDROBROMIDE 10 MG PO TABS: 10.0000 mg | ORAL_TABLET | Freq: Every day | ORAL | Status: DC

## 2020-11-11 NOTE — Progress Notes (Signed)
Careteam: Patient Care Team: [name], NP as PCP - General (Geriatric Medicine) [name], MD as PCP - Cardiology (Cardiology)  PLACE OF SERVICE:  [facility]  Advanced Directive information    Allergies  Allergen Reactions  . Lisinopril Other (See Comments)  . Losartan Other (See Comments)  . Memantine Other (See Comments)    combative  . Peanut-Containing Drug Products   . Tramadol Hives    Chief Complaint  Patient presents with  . Acute Visit    Patient with decreased appetite x several weeks. Patient with 26 pound weight loss      HPI: Patient is a 80 y.o. female for follow up.    Dementia- progressive decline. Sleeping more. They have cut back on her caffeine due to hyperactivity and wanting to always be on the go. Decreased appetite and has lost a lot of weight due to not eating. Drinking well.  No nausea, vomiting, diarrhea or constipation. No pain in mouth or trouble swelling  Osteoarthritis of bilateral knees- ongoing, uses tylenol   Anxiety/depression/insomnia- she is alone a lot of the time at home with husband, feels like that contributes to feeling of depression. Sometimes will cry if her husband does not take her where she wants to go.   CAD- without chest pain   Review of Systems:  Review of Systems  Unable to perform ROS: Dementia    Past Medical History:  Diagnosis Date  . Gallbladder disorder 2009   Per New Patient Packet   . Heart disease   . High blood pressure    Per New Patient Packet   . High cholesterol    Past Surgical History:  Procedure Laterality Date  . ABDOMINAL HYSTERECTOMY     Per New Patient Packet   . CARDIAC SURGERY  2011   Open Heart Surgey, Per New Patient Packet    . REPLACEMENT TOTAL KNEE Left 2010   Per New Patient Packet    Social History:   reports that she quit smoking about 33 years ago. She has never used smokeless tobacco. She reports previous drug use. She reports that she does  not drink alcohol.  Family History  Problem Relation Age of Onset  . Dementia Mother   . Arthritis Mother   . Breast cancer Mother   . Heart attack Father   . Arthritis Father   . Heart attack Sister   . Arthritis Sister   . Breast cancer Sister     Medications: Patient's Medications  New Prescriptions   No medications on file  Previous Medications   ACETAMINOPHEN (TYLENOL) 325 MG TABLET    Take 650 mg by mouth daily.    ASCORBIC ACID (VITAMIN C) 1000 MG TABLET    Take 1 tablet (1,000 mg total) by mouth daily.   ATORVASTATIN (LIPITOR) 40 MG TABLET    TAKE 1 TABLET BY MOUTH EVERY DAY   BUSPIRONE (BUSPAR) 10 MG TABLET    TAKE 1 TABLET BY MOUTH TWICE A DAY   CHOLECALCIFEROL (VITAMIN D3) 25 MCG (1000 UNIT) TABLET    Take 1 tablet (1,000 Units total) by mouth daily.   CITALOPRAM (CELEXA) 20 MG TABLET    TAKE 1 TABLET BY MOUTH EVERY DAY   DICLOFENAC SODIUM (VOLTAREN) 1 % GEL    Apply 4 g topically 4 (four) times daily. To both knees. May apply 2 gm to hands QID for arthritis   DONEPEZIL (ARICEPT) 10 MG TABLET    TAKE 1 TABLET BY  MOUTH EVERYDAY AT BEDTIME   FUROSEMIDE (LASIX) 20 MG TABLET    TAKE 1 TABLET BY MOUTH EVERY DAY AS NEEDED   INCONTINENCE SUPPLY DISPOSABLE MISC    XL incontinence brief to use as directed Dx: incontinence of urine   TRAZODONE (DESYREL) 50 MG TABLET    TAKE 1 TABLET BY MOUTH EVERY DAY IN THE MORNING AND 1/2 TAB IN THE EVENING  Modified Medications   No medications on file  Discontinued Medications   No medications on file    Physical Exam:  Vitals:   11/11/20 1517  BP: 130/70  Pulse: 62  Temp: (!) 96.8 F (36 C)  TempSrc: Temporal  SpO2: 97%  Weight: 216 lb (98 kg)  Height: 5\' 4"  (1.626 m)   Body mass index is 37.08 kg/m. Wt Readings from Last 3 Encounters:  11/11/20 216 lb (98 kg)  07/15/20 242 lb (109.8 kg)  04/10/20 236 lb 9.6 oz (107.3 kg)    Physical Exam Constitutional:      General: She is not in acute distress.    Appearance: She  is well-developed. She is not diaphoretic.  HENT:     Head: Normocephalic and atraumatic.     Mouth/Throat:     Pharynx: No oropharyngeal exudate.  Eyes:     Conjunctiva/sclera: Conjunctivae normal.     Pupils: Pupils are equal, round, and reactive to light.  Cardiovascular:     Rate and Rhythm: Normal rate and regular rhythm.     Heart sounds: Normal heart sounds.  Pulmonary:     Effort: Pulmonary effort is normal.     Breath sounds: Normal breath sounds.  Abdominal:     General: Bowel sounds are normal.     Palpations: Abdomen is soft.  Musculoskeletal:        General: No tenderness.     Cervical back: Normal range of motion and neck supple.  Skin:    General: Skin is warm and dry.  Neurological:     Mental Status: She is alert. Mental status is at baseline.     Labs reviewed: Basic Metabolic Panel: Recent Labs    04/10/20 1622 07/15/20 1204  NA 138 137  K 3.7 3.7  CL 103 103  CO2 26 28  GLUCOSE 96 81  BUN 12 8  CREATININE 1.18* 1.04*  CALCIUM 9.2 9.4   Liver Function Tests: Recent Labs    04/10/20 1622 07/15/20 1204  AST 13 16  ALT 10 10  BILITOT 0.4 0.6  PROT 6.9 6.9   No results for input(s): LIPASE, AMYLASE in the last 8760 hours. No results for input(s): AMMONIA in the last 8760 hours. CBC: Recent Labs    04/10/20 1622 07/15/20 1204  WBC 5.3 4.5  NEUTROABS 2,894 2,079  HGB 13.2 13.4  HCT 41.7 41.3  MCV 85.5 84.1  PLT 210 181   Lipid Panel: Recent Labs    07/15/20 1204  CHOL 149  HDL 55  LDLCALC 79  TRIG 74  CHOLHDL 2.7   TSH: No results for input(s): TSH in the last 8760 hours. A1C: No results found for: HGBA1C   Assessment/Plan 1. Coronary artery disease involving native coronary artery of native heart without angina pectoris -stable, without chest pains, continues on ASA 81 mg daily - CBC with Differential/Platelet  2. Essential hypertension Controlled on current regimen..  3. Dementia without behavioral disturbance,  unspecified dementia type (HCC) Progressive decline. She has lost weight and expect ongoing weight loss as disease progresses. Continues on  aricept and supportive care from family   - COMPLETE METABOLIC PANEL WITH GFR - CBC with Differential/Platelet - Vitamin B12  4. Mixed hyperlipidemia -LDL at goal on lipitor.  - COMPLETE METABOLIC PANEL WITH GFR  5. Recurrent major depressive disorder, in partial remission (HCC) Continues on celexa and trazodone, will decrease celexa for GDR  - Vitamin B12  6. Weight loss -26 lb weight loss since January. She is not eating much per daughter. She has tried to encouraged her to eat and making food more appealing with presentation. Can liberalize diet at this time. Add supplement daily.  - COMPLETE METABOLIC PANEL WITH GFR - TSH - CBC with Differential/Platelet  Next appt: 3 months. Janene Harvey. Biagio Borg  [facility] [phone]

## 2020-11-11 NOTE — Patient Instructions (Addendum)
Decrease celexa to 10 mg by mouth daily  Continue all other medication as prescribed   ASA EC 81 mg daily   encourage 3 meals a day- to use nutritional supplement daily  Try boost breeze.  Continue to make meals appealing.  Liberalized diet.

## 2020-11-12 LAB — COMPLETE METABOLIC PANEL WITH GFR
AG Ratio: 1.1 (calc) (ref 1.0–2.5)
ALT: 8 U/L (ref 6–29)
AST: 16 U/L (ref 10–35)
Albumin: 3.7 g/dL (ref 3.6–5.1)
Alkaline phosphatase (APISO): 86 U/L (ref 37–153)
BUN/Creatinine Ratio: 5 (calc) — ABNORMAL LOW (ref 6–22)
BUN: 5 mg/dL — ABNORMAL LOW (ref 7–25)
CO2: 23 mmol/L (ref 20–32)
Calcium: 9.1 mg/dL (ref 8.6–10.4)
Chloride: 101 mmol/L (ref 98–110)
Creat: 1.01 mg/dL — ABNORMAL HIGH (ref 0.60–0.88)
GFR, Est African American: 60 mL/min/{1.73_m2} (ref >=60)
GFR, Est Non African American: 52 mL/min/{1.73_m2} — ABNORMAL LOW (ref >=60)
Globulin: 3.3 g/dL (calc) (ref 1.9–3.7)
Glucose, Bld: 92 mg/dL (ref 65–139)
Potassium: 3.3 mmol/L — ABNORMAL LOW (ref 3.5–5.3)
Sodium: 137 mmol/L (ref 135–146)
Total Bilirubin: 0.7 mg/dL (ref 0.2–1.2)
Total Protein: 7 g/dL (ref 6.1–8.1)

## 2020-11-12 LAB — CBC WITH DIFFERENTIAL/PLATELET
Absolute Monocytes: 395 cells/uL (ref 200–950)
Basophils Absolute: 42 cells/uL (ref 0–200)
Basophils Relative: 0.8 %
Eosinophils Absolute: 52 cells/uL (ref 15–500)
Eosinophils Relative: 1 %
HCT: 43.1 % (ref 35.0–45.0)
Hemoglobin: 13.8 g/dL (ref 11.7–15.5)
Lymphs Abs: 1612 cells/uL (ref 850–3900)
MCH: 27.4 pg (ref 27.0–33.0)
MCHC: 32 g/dL (ref 32.0–36.0)
MCV: 85.7 fL (ref 80.0–100.0)
MPV: 11.4 fL (ref 7.5–12.5)
Monocytes Relative: 7.6 %
Neutro Abs: 3099 cells/uL (ref 1500–7800)
Neutrophils Relative %: 59.6 %
Platelets: 170 10*3/uL (ref 140–400)
RBC: 5.03 10*6/uL (ref 3.80–5.10)
RDW: 13.5 % (ref 11.0–15.0)
Total Lymphocyte: 31 %
WBC: 5.2 10*3/uL (ref 3.8–10.8)

## 2020-11-12 LAB — TSH: TSH: 0.92 mIU/L (ref 0.40–4.50)

## 2020-11-12 LAB — VITAMIN B12: Vitamin B-12: 437 pg/mL (ref 200–1100)

## 2020-11-13 ENCOUNTER — Telehealth: Payer: Self-pay

## 2020-11-13 NOTE — Telephone Encounter (Signed)
-----   Message from [name], NP sent at 11/12/2020  8:42 AM EDT ----- Her potassium is slightly low, likely from getting lasix every day- hold lasix and encouraged food and supplements also can use Gatorade- high in potasium over the next several days  To use lasix as needed for swelling on lower legs. If you start to notice swelling more regularly we can discussing adding the lasix back in but starting a potassium supplement as well.  Thyroid function normal.  Blood counts normal.  B12 on the low side- recommend to start vit b12 1000 mcg daily supplement (this is over the counter)

## 2020-12-04 ENCOUNTER — Telehealth: Payer: Self-pay | Admitting: *Deleted

## 2020-12-04 NOTE — Telephone Encounter (Signed)
Patient daughter, Shawna Orleans, called and stated that patient is still not eating a lot. Stated that she will eat Chicken Noodle Soup, Gatorade, and water. Still using the bathroom and swallowing fine.  Daughter wants to know if there is anything else they can do to try and Increase patient's appetite.    Please Advise. (forwarded to Amy due to Shanda Bumps out of office)

## 2020-12-04 NOTE — Telephone Encounter (Signed)
LMOM to return call.

## 2020-12-04 NOTE — Telephone Encounter (Signed)
Recommend high calorie Ensure or Boost shakes twice daily- add vanilla ice cram or frozen yogurt for additional calories.

## 2020-12-04 NOTE — Telephone Encounter (Signed)
Patient daughter notified and agreed.  

## 2020-12-11 ENCOUNTER — Other Ambulatory Visit: Payer: Self-pay | Admitting: Nurse Practitioner

## 2020-12-11 DIAGNOSIS — F32A Depression, unspecified: Secondary | ICD-10-CM

## 2020-12-11 DIAGNOSIS — G4700 Insomnia, unspecified: Secondary | ICD-10-CM

## 2020-12-11 NOTE — Telephone Encounter (Signed)
Patient last office visit was on 11/11/2020 and upcoming appointment is on 02/02/21. Last refill was on 06/09/2020. Please advise.

## 2020-12-24 ENCOUNTER — Other Ambulatory Visit: Payer: Self-pay | Admitting: Nurse Practitioner

## 2020-12-24 DIAGNOSIS — M2540 Effusion, unspecified joint: Secondary | ICD-10-CM

## 2021-01-01 ENCOUNTER — Telehealth: Payer: Self-pay | Admitting: *Deleted

## 2021-01-01 NOTE — Telephone Encounter (Signed)
Laurie Mays, daughter called and stated that when she gives Furosemide, 1/2 Trazodone and Buspirone patient is more Combative.   Stated that when she leaves off the Furosemide patient is fine. Only gives the Furosemide as needed for swelling.   Daughter is wanting to know if this is reacting with each other and what she should do.   Please Advise.

## 2021-01-02 ENCOUNTER — Encounter: Payer: Self-pay | Admitting: Nurse Practitioner

## 2021-01-02 NOTE — Telephone Encounter (Signed)
It sounds like it, would stop lasix and if there is a problem with increase swelling that does not improve to notify office.

## 2021-01-04 NOTE — Telephone Encounter (Signed)
Laurie Mays, daughter, notified and agreed.

## 2021-01-04 NOTE — Telephone Encounter (Signed)
LMOM to return call.

## 2021-01-05 NOTE — Telephone Encounter (Signed)
err

## 2021-01-07 ENCOUNTER — Other Ambulatory Visit: Payer: Self-pay | Admitting: Nurse Practitioner

## 2021-01-07 DIAGNOSIS — E782 Mixed hyperlipidemia: Secondary | ICD-10-CM

## 2021-01-17 ENCOUNTER — Other Ambulatory Visit: Payer: Self-pay | Admitting: Nurse Practitioner

## 2021-01-17 DIAGNOSIS — F32A Depression, unspecified: Secondary | ICD-10-CM

## 2021-01-17 DIAGNOSIS — F419 Anxiety disorder, unspecified: Secondary | ICD-10-CM

## 2021-01-18 ENCOUNTER — Ambulatory Visit: Payer: Medicare PPO | Admitting: Podiatry

## 2021-01-19 ENCOUNTER — Telehealth: Payer: Self-pay

## 2021-01-19 NOTE — Telephone Encounter (Signed)
Let have her set up a visit to discuss medication management.

## 2021-01-19 NOTE — Telephone Encounter (Signed)
Laurie Mays, the patient's daughter called stating the patient's dementia is progressing and they need something to help rest better at night. She is raging and wakes up in the middle of the night wanting to call the police. She heard of a medication called quetiapine and wonder if this would be a good idea for her mother to start or increasing her buspirone. To Areatha Keas.NP.

## 2021-01-19 NOTE — Telephone Encounter (Signed)
Discussed with Melonie. She declined scheduling an apnt. And will wait for July 26 AWV.

## 2021-01-20 ENCOUNTER — Other Ambulatory Visit: Payer: Self-pay | Admitting: Nurse Practitioner

## 2021-01-20 DIAGNOSIS — F32A Depression, unspecified: Secondary | ICD-10-CM

## 2021-01-24 ENCOUNTER — Other Ambulatory Visit: Payer: Self-pay | Admitting: Nurse Practitioner

## 2021-01-24 DIAGNOSIS — F32A Depression, unspecified: Secondary | ICD-10-CM

## 2021-01-27 ENCOUNTER — Other Ambulatory Visit: Payer: Self-pay | Admitting: Nurse Practitioner

## 2021-01-27 DIAGNOSIS — F419 Anxiety disorder, unspecified: Secondary | ICD-10-CM

## 2021-01-27 DIAGNOSIS — F32A Depression, unspecified: Secondary | ICD-10-CM

## 2021-01-28 ENCOUNTER — Other Ambulatory Visit: Payer: Self-pay | Admitting: *Deleted

## 2021-01-28 DIAGNOSIS — F32A Depression, unspecified: Secondary | ICD-10-CM

## 2021-01-28 MED ORDER — CITALOPRAM HYDROBROMIDE 10 MG PO TABS: 10.0000 mg | ORAL_TABLET | Freq: Every day | ORAL | 1 refills | Status: DC

## 2021-01-28 NOTE — Telephone Encounter (Signed)
Patient daughter requested refill.  Pended and sent to [facility] for approval due to HIGH ALERT Warning. Shanda Bumps out of office)

## 2021-01-29 ENCOUNTER — Encounter: Payer: Medicare PPO | Admitting: Nurse Practitioner

## 2021-02-02 ENCOUNTER — Ambulatory Visit (INDEPENDENT_AMBULATORY_CARE_PROVIDER_SITE_OTHER): Payer: Medicare PPO | Admitting: Nurse Practitioner

## 2021-02-02 ENCOUNTER — Other Ambulatory Visit: Payer: Self-pay

## 2021-02-02 ENCOUNTER — Encounter: Payer: Self-pay | Admitting: Nurse Practitioner

## 2021-02-02 DIAGNOSIS — Z0120 Encounter for dental examination and cleaning without abnormal findings: Secondary | ICD-10-CM

## 2021-02-02 DIAGNOSIS — Z0000 Encounter for general adult medical examination without abnormal findings: Secondary | ICD-10-CM | POA: Diagnosis not present

## 2021-02-02 DIAGNOSIS — F32A Depression, unspecified: Secondary | ICD-10-CM

## 2021-02-02 DIAGNOSIS — F419 Anxiety disorder, unspecified: Secondary | ICD-10-CM

## 2021-02-02 MED ORDER — BUSPIRONE HCL 10 MG PO TABS: 10.0000 mg | ORAL_TABLET | Freq: Three times a day (TID) | ORAL | 0 refills | Status: DC

## 2021-02-02 NOTE — Progress Notes (Signed)
This service is provided via telemedicine  No vital signs collected/recorded due to the encounter was a telemedicine visit.   Location of patient (ex: home, work):  Home  Patient consents to a telephone visit:  Yes, see encounter dated 09/22/2020  Location of the provider (ex: office, home):  [facility]  Name of any referring provider:  N/A  Names of all persons participating in the telemedicine service and their role in the encounter:  Abbey Chatters, Nurse Practitioner, Elveria Royals, CMA, patient and daughter,Melanie  Time spent on call:  10 minutes with medical assistant

## 2021-02-02 NOTE — Patient Instructions (Signed)
Laurie Mays , Thank you for taking time to come for your Medicare Wellness Visit. I appreciate your ongoing commitment to your health goals. Please review the following plan we discussed and let me know if I can assist you in the future.   Screening recommendations/referrals: Colonoscopy aged out Mammogram aged out Bone Density unable to complete Recommended yearly ophthalmology/optometry visit for glaucoma screening and checkup Recommended yearly dental visit for hygiene and checkup  Vaccinations: Influenza vaccine up to date Pneumococcal vaccine up to date Tdap vaccine RECOMMENDED to get at the local pharmacy Shingles vaccine RECOMMENDED to get at the local pharmacy     Advanced directives: on file.   Conditions/risks identified: worsening memory loss.   Next appointment: 1 year for AWV.    Preventive Care 82 Years and Older, Female Preventive care refers to lifestyle choices and visits with your health care provider that can promote health and wellness. What does preventive care include? A yearly physical exam. This is also called an annual well check. Dental exams once or twice a year. Routine eye exams. Ask your health care provider how often you should have your eyes checked. Personal lifestyle choices, including: Daily care of your teeth and gums. Regular physical activity. Eating a healthy diet. Avoiding tobacco and drug use. Limiting alcohol use. Practicing safe sex. Taking low-dose aspirin every day. Taking vitamin and mineral supplements as recommended by your health care provider. What happens during an annual well check? The services and screenings done by your health care provider during your annual well check will depend on your age, overall health, lifestyle risk factors, and family history of disease. Counseling  Your health care provider may ask you questions about your: Alcohol use. Tobacco use. Drug use. Emotional well-being. Home and relationship  well-being. Sexual activity. Eating habits. History of falls. Memory and ability to understand (cognition). Work and work Astronomer. Reproductive health. Screening  You may have the following tests or measurements: Height, weight, and BMI. Blood pressure. Lipid and cholesterol levels. These may be checked every 5 years, or more frequently if you are over 13 years old. Skin check. Lung cancer screening. You may have this screening every year starting at age 48 if you have a 30-pack-year history of smoking and currently smoke or have quit within the past 15 years. Fecal occult blood test (FOBT) of the stool. You may have this test every year starting at age 70. Flexible sigmoidoscopy or colonoscopy. You may have a sigmoidoscopy every 5 years or a colonoscopy every 10 years starting at age 62. Hepatitis C blood test. Hepatitis B blood test. Sexually transmitted disease (STD) testing. Diabetes screening. This is done by checking your blood sugar (glucose) after you have not eaten for a while (fasting). You may have this done every 1-3 years. Bone density scan. This is done to screen for osteoporosis. You may have this done starting at age 64. Mammogram. This may be done every 1-2 years. Talk to your health care provider about how often you should have regular mammograms. Talk with your health care provider about your test results, treatment options, and if necessary, the need for more tests. Vaccines  Your health care provider may recommend certain vaccines, such as: Influenza vaccine. This is recommended every year. Tetanus, diphtheria, and acellular pertussis (Tdap, Td) vaccine. You may need a Td booster every 10 years. Zoster vaccine. You may need this after age 22. Pneumococcal 13-valent conjugate (PCV13) vaccine. One dose is recommended after age 71. Pneumococcal polysaccharide (PPSV23) vaccine.  One dose is recommended after age 81. Talk to your health care provider about which  screenings and vaccines you need and how often you need them. This information is not intended to replace advice given to you by your health care provider. Make sure you discuss any questions you have with your health care provider. Document Released: 07/24/2015 Document Revised: 03/16/2016 Document Reviewed: 04/28/2015 Elsevier Interactive Patient Education  2017 [place] Prevention in the Home Falls can cause injuries. They can happen to people of all ages. There are many things you can do to make your home safe and to help prevent falls. What can I do on the outside of my home? Regularly fix the edges of walkways and driveways and fix any cracks. Remove anything that might make you trip as you walk through a door, such as a raised step or threshold. Trim any bushes or trees on the path to your home. Use bright outdoor lighting. Clear any walking paths of anything that might make someone trip, such as rocks or tools. Regularly check to see if handrails are loose or broken. Make sure that both sides of any steps have handrails. Any raised decks and porches should have guardrails on the edges. Have any leaves, snow, or ice cleared regularly. Use sand or salt on walking paths during winter. Clean up any spills in your garage right away. This includes oil or grease spills. What can I do in the bathroom? Use night lights. Install grab bars by the toilet and in the tub and shower. Do not use towel bars as grab bars. Use non-skid mats or decals in the tub or shower. If you need to sit down in the shower, use a plastic, non-slip stool. Keep the floor dry. Clean up any water that spills on the floor as soon as it happens. Remove soap buildup in the tub or shower regularly. Attach bath mats securely with double-sided non-slip rug tape. Do not have throw rugs and other things on the floor that can make you trip. What can I do in the bedroom? Use night lights. Make sure that you have a  light by your bed that is easy to reach. Do not use any sheets or blankets that are too big for your bed. They should not hang down onto the floor. Have a firm chair that has side arms. You can use this for support while you get dressed. Do not have throw rugs and other things on the floor that can make you trip. What can I do in the kitchen? Clean up any spills right away. Avoid walking on wet floors. Keep items that you use a lot in easy-to-reach places. If you need to reach something above you, use a strong step stool that has a grab bar. Keep electrical cords out of the way. Do not use floor polish or wax that makes floors slippery. If you must use wax, use non-skid floor wax. Do not have throw rugs and other things on the floor that can make you trip. What can I do with my stairs? Do not leave any items on the stairs. Make sure that there are handrails on both sides of the stairs and use them. Fix handrails that are broken or loose. Make sure that handrails are as long as the stairways. Check any carpeting to make sure that it is firmly attached to the stairs. Fix any carpet that is loose or worn. Avoid having throw rugs at the top or bottom of  the stairs. If you do have throw rugs, attach them to the floor with carpet tape. Make sure that you have a light switch at the top of the stairs and the bottom of the stairs. If you do not have them, ask someone to add them for you. What else can I do to help prevent falls? Wear shoes that: Do not have high heels. Have rubber bottoms. Are comfortable and fit you well. Are closed at the toe. Do not wear sandals. If you use a stepladder: Make sure that it is fully opened. Do not climb a closed stepladder. Make sure that both sides of the stepladder are locked into place. Ask someone to hold it for you, if possible. Clearly mark and make sure that you can see: Any grab bars or handrails. First and last steps. Where the edge of each step  is. Use tools that help you move around (mobility aids) if they are needed. These include: Canes. Walkers. Scooters. Crutches. Turn on the lights when you go into a dark area. Replace any light bulbs as soon as they burn out. Set up your furniture so you have a clear path. Avoid moving your furniture around. If any of your floors are uneven, fix them. If there are any pets around you, be aware of where they are. Review your medicines with your doctor. Some medicines can make you feel dizzy. This can increase your chance of falling. Ask your doctor what other things that you can do to help prevent falls. This information is not intended to replace advice given to you by your health care provider. Make sure you discuss any questions you have with your health care provider. Document Released: 04/23/2009 Document Revised: 12/03/2015 Document Reviewed: 08/01/2014 Elsevier Interactive Patient Education  2017 [employer].

## 2021-02-02 NOTE — Progress Notes (Signed)
Subjective:   Laurie Mays is a 80 y.o. female who presents for Medicare Annual (Subsequent) preventive examination.  Review of Systems     Cardiac Risk Factors include: advanced age (>[id number], 22>65 women);sedentary lifestyle;hypertension;dyslipidemia;obesity (BMI >30kg/m2)     Objective:    There were no vitals filed for this visit. There is no height or weight on file to calculate BMI.  Advanced Directives 02/02/2021 01/29/2020 08/26/2019 01/28/2019 11/21/2018 05/23/2018 01/19/2018  Does Patient Have a Medical Advance Directive? Yes Yes Yes Yes Yes Yes Yes  Type of Estate agentAdvance Directive Healthcare Power of State Street Corporationttorney Healthcare Power of RosmanAttorney;Living will Healthcare Power of [employer]ttorney Healthcare Power of [employer]ttorney Healthcare Power of [employer] Healthcare Power of Attorney Healthcare Power of Attorney  Does patient want to make changes to medical advance directive? No - Patient declined No - Patient declined No - Patient declined No - Guardian declined No - Patient declined - No - Patient declined  Copy of Healthcare Power of Attorney in Chart? Yes - validated most recent copy scanned in chart (See row information) Yes - validated most recent copy scanned in chart (See row information) Yes - validated most recent copy scanned in chart (See row information) Yes - validated most recent copy scanned in chart (See row information) - - Yes    Current Medications (verified) Outpatient Encounter Medications as of 02/02/2021  Medication Sig   acetaminophen (TYLENOL) 325 MG tablet Take 650 mg by mouth daily.    Ascorbic Acid (VITAMIN C) 1000 MG tablet Take 1 tablet (1,000 mg total) by mouth daily.   atorvastatin (LIPITOR) 40 MG tablet TAKE 1 TABLET BY MOUTH EVERY DAY   cholecalciferol (VITAMIN D3) 25 MCG (1000 UNIT) tablet Take 1 tablet (1,000 Units total) by mouth daily.   citalopram (CELEXA) 10 MG tablet Take 1 tablet (10 mg total) by mouth daily.   cyanocobalamin 1000 MCG tablet Take 1,000 mcg by mouth  daily.   diclofenac sodium (VOLTAREN) 1 % GEL Apply 4 g topically 4 (four) times daily. To both knees. May apply 2 gm to hands QID for arthritis   donepezil (ARICEPT) 10 MG tablet TAKE 1 TABLET BY MOUTH EVERYDAY AT BEDTIME   Incontinence Supply Disposable MISC XL incontinence brief to use as directed Dx: incontinence of urine   traZODone (DESYREL) 50 MG tablet TAKE 1 TABLET BY MOUTH EVERY DAY IN THE MORNING AND 1/2 TAB IN THE EVENING   [DISCONTINUED] busPIRone (BUSPAR) 10 MG tablet TAKE 1 TABLET BY MOUTH TWICE A DAY   busPIRone (BUSPAR) 10 MG tablet Take 1 tablet (10 mg total) by mouth 3 (three) times daily.   furosemide (LASIX) 20 MG tablet TAKE 1 TABLET BY MOUTH EVERY DAY AS NEEDED (Patient not taking: Reported on 02/02/2021)   [DISCONTINUED] Multiple Vitamin (MULTIVITAMIN) tablet Take 1 tablet by mouth daily.   No facility-administered encounter medications on file as of 02/02/2021.    Allergies (verified) Lisinopril, Losartan, Memantine, Peanut-containing drug products, and Tramadol   History: Past Medical History:  Diagnosis Date   Gallbladder disorder 2009   Per New Patient Packet    Heart disease    High blood pressure    Per New Patient Packet    High cholesterol    Past Surgical History:  Procedure Laterality Date   ABDOMINAL HYSTERECTOMY     Per New Patient Packet    CARDIAC SURGERY  2011   Open Heart Surgey, Per New Patient Packet     REPLACEMENT TOTAL KNEE Left 2010  Per New Patient Packet    Family History  Problem Relation Age of Onset   Dementia Mother    Arthritis Mother    Breast cancer Mother    Heart attack Father    Arthritis Father    Heart attack Sister    Arthritis Sister    Breast cancer Sister    Social History   Socioeconomic History   Marital status: Married    Spouse name: Not on file   Number of children: Not on file   Years of education: Not on file   Highest education level: Not on file  Occupational History   Not on file  Tobacco  Use   Smoking status: Former    Types: Cigarettes    Quit date: 07/12/1987    Years since quitting: 33.5   Smokeless tobacco: Never   Tobacco comments:    Quit about 30 years ago as of 2019   Vaping Use   Vaping Use: Never used  Substance and Sexual Activity   Alcohol use: Never   Drug use: Not Currently   Sexual activity: Not on file  Other Topics Concern   Not on file  Social History Narrative   Diet: N/A      Caffeine: Coffee and Pepsi      Married, if yes what year: Yes, 1961      Do you live in a house, apartment, assisted living, condo, trailer, ect: House, one stories, 1 person       Pets: None      Current/Past profession: Geologist, engineering       Exercise: No          Living Will: Yes   DNR:   POA/HPOA:      Functional Status:   Do you have difficulty bathing or dressing yourself? No   Do you have difficulty preparing food or eating? Yes   Do you have difficulty managing your medications? Yes    Do you have difficulty managing your finances? Yes    Do you have difficulty affording your medications? No   Social Determinants of Corporate investment banker Strain: Not on file  Food Insecurity: Not on file  Transportation Needs: Not on file  Physical Activity: Not on file  Stress: Not on file  Social Connections: Not on file    Tobacco Counseling Counseling given: Not Answered Tobacco comments: Quit about 30 years ago as of 2019    Clinical Intake:  Pre-visit preparation completed: Yes  Pain : No/denies pain     BMI - recorded: 37 Nutritional Status: BMI > 30  Obese Nutritional Risks: Unintentional weight loss Diabetes: No  How often do you need to have someone help you when you read instructions, pamphlets, or other written materials from your doctor or pharmacy?: 5 - Always  Diabetic?no         Activities of Daily Living In your present state of health, do you have any difficulty performing the following activities: 02/02/2021   Hearing? Y  Vision? N  Difficulty concentrating or making decisions? Y  Walking or climbing stairs? Y  Dressing or bathing? Y  Doing errands, shopping? Y  Preparing Food and eating ? Y  Using the Toilet? Y  In the past six months, have you accidently leaked urine? Y  Do you have problems with loss of bowel control? N  Managing your Medications? Y  Managing your Finances? Y  Housekeeping or managing your Housekeeping? Y  Some recent data might  be hidden    Patient Care Team: [name], NP as PCP - General (Geriatric Medicine) [name], MD as PCP - Cardiology (Cardiology)  Indicate any recent Medical Services you may have received from other than [facility] providers in the past year (date may be approximate).     Assessment:   This is a routine wellness examination for Ashanty.  Hearing/Vision screen No results found.  Dietary issues and exercise activities discussed: Current Exercise Habits: The patient does not participate in regular exercise at present   Goals Addressed   None    Depression Screen PHQ 2/9 Scores 02/02/2021 01/29/2020 08/16/2019 01/28/2019 11/21/2018 01/19/2018 01/19/2018  PHQ - 2 Score 0 0 0 0 0 0 0    Fall Risk Fall Risk  02/02/2021 01/29/2020 10/23/2019 08/26/2019 08/16/2019  Falls in the past year? 1 0 0 0 0  Number falls in past yr: 0 0 0 0 0  Injury with Fall? 0 0 0 0 0  Risk for fall due to : History of fall(s) - - - -  Follow up Falls evaluation completed - - - -    FALL RISK PREVENTION PERTAINING TO THE HOME:  Any stairs in or around the home? No  If so, are there any without handrails?  Home free of loose throw rugs in walkways, pet beds, electrical cords, etc? Yes  Adequate lighting in your home to reduce risk of falls? Yes   ASSISTIVE DEVICES UTILIZED TO PREVENT FALLS:  Life alert? No  Use of a cane, walker or w/c? Yes  Grab bars in the bathroom? Yes  Shower chair or bench in shower? Yes  Elevated toilet seat or a  handicapped toilet? Yes   TIMED UP AND GO:  Was the test performed? No .   Cognitive Function: MMSE - Mini Mental State Exam 01/29/2020 01/19/2018  Orientation to time 1 1  Orientation to Place 4 3  Registration 3 3  Attention/ Calculation 2 5  Recall 0 0  Language- name 2 objects 2 2  Language- repeat 1 1  Language- follow 3 step command 3 3  Language- read & follow direction 1 1  Write a sentence 1 1  Copy design 0 0  Total score 18 20     6CIT Screen 02/02/2021 01/28/2019  What Year? 0 points 0 points  What month? 3 points 0 points  What time? 3 points 0 points  Count back from 20 2 points 2 points  Months in reverse 4 points 0 points  Repeat phrase 10 points 4 points  Total Score 22 6    Immunizations Immunization History  Administered Date(s) Administered   Fluad Quad(high Dose 65+) 04/24/2019, 04/10/2020   Influenza, High Dose Seasonal PF 07/11/1968, 05/25/2017, 05/23/2018   Pneumococcal Conjugate-13 01/19/2018   Pneumococcal Polysaccharide-23 07/15/2020   Tdap 07/11/1988    TDAP status: Due, Education has been provided regarding the importance of this vaccine. Advised may receive this vaccine at local pharmacy or Health Dept. Aware to provide a copy of the vaccination record if obtained from local pharmacy or Health Dept. Verbalized acceptance and understanding.  Flu Vaccine status: Up to date  Pneumococcal vaccine status: Up to date  Covid-19 vaccine status: Information provided on how to obtain vaccines.   Qualifies for Shingles Vaccine? Yes   Zostavax completed No   Shingrix Completed?: No.    Education has been provided regarding the importance of this vaccine. Patient has been advised to call insurance company to determine out  of pocket expense if they have not yet received this vaccine. Advised may also receive vaccine at local pharmacy or Health Dept. Verbalized acceptance and understanding.  Screening Tests Health Maintenance  Topic Date Due    COVID-19 Vaccine (1) Never done   Zoster Vaccines- Shingrix (1 of 2) Never done   TETANUS/TDAP  07/11/1998   INFLUENZA VACCINE  02/08/2021   PNA vac Low Risk Adult  Completed   HPV VACCINES  Aged Out   DEXA SCAN  Discontinued    Health Maintenance  Health Maintenance Due  Topic Date Due   COVID-19 Vaccine (1) Never done   Zoster Vaccines- Shingrix (1 of 2) Never done   TETANUS/TDAP  07/11/1998    Colorectal cancer screening: No longer required.   Mammogram status: No longer required due to aged out.  Unable to complete.   Lung Cancer Screening: (Low Dose CT Chest recommended if Age 28-80 years, 30 pack-year currently smoking OR have quit w/in 15years.) does not qualify.   Lung Cancer Screening Referral: na  Additional Screening:  Hepatitis C Screening: does not qualify; Completed na  Vision Screening: Recommended annual ophthalmology exams for early detection of glaucoma and other disorders of the eye. Is the patient up to date with their annual eye exam?  No  Who is the provider or what is the name of the office in which the patient attends annual eye exams? Dr Georganna Skeans.  If pt is not established with a provider, would they like to be referred to a provider to establish care? No .   Dental Screening: Recommended annual dental exams for proper oral hygiene  Community Resource Referral / Chronic Care Management: CRR required this visit?  No   CCM required this visit?  No      Plan:     I have personally reviewed and noted the following in the patient's chart:   Medical and social history Use of alcohol, tobacco or illicit drugs  Current medications and supplements including opioid prescriptions.  Functional ability and status Nutritional status Physical activity Advanced directives List of other physicians Hospitalizations, surgeries, and ER visits in previous 12 months Vitals Screenings to include cognitive, depression, and falls Referrals and  appointments  In addition, I have reviewed and discussed with patient certain preventive protocols, quality metrics, and best practice recommendations. A written personalized care plan for preventive services as well as general preventive health recommendations were provided to patient.     [name], NP   02/02/2021    Virtual Visit via Telephone Note  I connected withNAME@ on 02/02/21 at  9:00 AM EDT by telephone and verified that I am speaking with the correct person using two identifiers.  Location: Patient: home Provider: [facility]   I discussed the limitations, risks, security and privacy concerns of performing an evaluation and management service by telephone and the availability of in person appointments. I also discussed with the patient that there may be a patient responsible charge related to this service. The patient expressed understanding and agreed to proceed.   I discussed the assessment and treatment plan with the patient. The patient was provided an opportunity to ask questions and all were answered. The patient agreed with the plan and demonstrated an understanding of the instructions.   The patient was advised to call back or seek an in-person evaluation if the symptoms worsen or if the condition fails to improve as anticipated.  I provided 20 minutes of non-face-to-face time during this encounter.  Carlos [employer]. Harle Battiest Avs printed and mailed

## 2021-02-10 ENCOUNTER — Telehealth: Payer: Self-pay | Admitting: *Deleted

## 2021-02-10 NOTE — Telephone Encounter (Signed)
Patient daughter notified and agreed.  

## 2021-02-10 NOTE — Telephone Encounter (Signed)
Laurie Mays, daughter, called and stated that patient went to dentist today to have her teeth pulled but they were unable to do it.  Stated that she is going to have to go to a Transport planner and be put to sleep.   Daughter is wondering if patient is ok to be put to sleep.   Please Advise.

## 2021-02-10 NOTE — Telephone Encounter (Signed)
It depends on how they are going to "put her to sleep" I would have the consult with the doctor and they will give you more details

## 2021-04-17 ENCOUNTER — Other Ambulatory Visit: Payer: Self-pay | Admitting: Nurse Practitioner

## 2021-04-17 DIAGNOSIS — F419 Anxiety disorder, unspecified: Secondary | ICD-10-CM

## 2021-05-03 ENCOUNTER — Other Ambulatory Visit: Payer: Self-pay

## 2021-05-03 ENCOUNTER — Ambulatory Visit: Payer: Medicare PPO | Admitting: Podiatry

## 2021-05-03 DIAGNOSIS — B351 Tinea unguium: Secondary | ICD-10-CM | POA: Diagnosis not present

## 2021-05-03 DIAGNOSIS — N183 Chronic kidney disease, stage 3 unspecified: Secondary | ICD-10-CM

## 2021-05-03 NOTE — Progress Notes (Signed)
  Subjective:  Patient ID: Laurie Mays, female    DOB: [birth year],  MRN: [id number]  Chief Complaint  Patient presents with   debride    RFC -BL nails trimming   80 y.o. female presents with the above complaint. History confirmed with patient. Denies complaints.  Objective:  Physical Exam: warm, good capillary refill, no trophic changes or ulcerative lesions, normal DP and PT pulses, and normal sensory exam. Nails thickened and dystrophic  No images are attached to the encounter.   Assessment:   1. Onychomycosis   2. Stage 3 chronic kidney disease, unspecified whether stage 3a or 3b CKD (HCC)    Plan:  Patient was evaluated and treated and all questions answered.  Onychomycosis and CKD -Nails palliatively debrided secondary to pain  Procedure: Nail Debridement Type of Debridement: manual, sharp debridement. Instrumentation: Nail nipper, rotary burr. Number of Nails: 10  No follow-ups on file.

## 2021-06-09 ENCOUNTER — Other Ambulatory Visit: Payer: Self-pay | Admitting: Nurse Practitioner

## 2021-06-09 DIAGNOSIS — F419 Anxiety disorder, unspecified: Secondary | ICD-10-CM

## 2021-06-09 DIAGNOSIS — F0390 Unspecified dementia without behavioral disturbance: Secondary | ICD-10-CM

## 2021-06-09 DIAGNOSIS — G4700 Insomnia, unspecified: Secondary | ICD-10-CM

## 2021-06-09 NOTE — Telephone Encounter (Signed)
Last routine visit was in May 2022 and patient was told to follow-up in 3 month which would've been in August 2022.    Patient had a AWV in July and an appointment was not scheduled for next AWV nor was patient prompted to schedule a follow-up. Please advise if and when patient should follow-up.

## 2021-06-09 NOTE — Telephone Encounter (Signed)
Spoke with patients daughter Shawna Orleans and scheduled a follow-up for next Wednesday @ 10:30 am. When patient checks out next Wednesday we can indicate on AVS to schedule AWV for next year along with any other future recommended appointments

## 2021-06-09 NOTE — Telephone Encounter (Signed)
Please have pt (daughter will schedule) schedule follow up for next available with me (If it has to be a few weeks out due to scheduling that is fine) and set up AWV as well via telephone. Thank you

## 2021-06-16 ENCOUNTER — Other Ambulatory Visit: Payer: Self-pay

## 2021-06-16 ENCOUNTER — Ambulatory Visit: Payer: Medicare PPO | Admitting: Nurse Practitioner

## 2021-06-16 ENCOUNTER — Encounter: Payer: Self-pay | Admitting: Nurse Practitioner

## 2021-06-16 VITALS — BP 140/70 | HR 57 | Temp 97.1°F | Ht 64.0 in | Wt 216.2 lb

## 2021-06-16 DIAGNOSIS — I10 Essential (primary) hypertension: Secondary | ICD-10-CM

## 2021-06-16 DIAGNOSIS — I2510 Atherosclerotic heart disease of native coronary artery without angina pectoris: Secondary | ICD-10-CM

## 2021-06-16 DIAGNOSIS — F32A Depression, unspecified: Secondary | ICD-10-CM

## 2021-06-16 DIAGNOSIS — G4700 Insomnia, unspecified: Secondary | ICD-10-CM | POA: Diagnosis not present

## 2021-06-16 DIAGNOSIS — F419 Anxiety disorder, unspecified: Secondary | ICD-10-CM | POA: Diagnosis not present

## 2021-06-16 DIAGNOSIS — E782 Mixed hyperlipidemia: Secondary | ICD-10-CM

## 2021-06-16 DIAGNOSIS — Z23 Encounter for immunization: Secondary | ICD-10-CM

## 2021-06-16 MED ORDER — TRAZODONE HCL 50 MG PO TABS: 50.0000 mg | ORAL_TABLET | Freq: Every day | ORAL | 1 refills | Status: DC

## 2021-06-16 NOTE — Patient Instructions (Addendum)
Stop trazodone in the morning and take 1 tablet in the evening

## 2021-06-16 NOTE — Progress Notes (Signed)
Careteam: Patient Care Team: [name], NP as PCP - General (Geriatric Medicine) [name], MD as PCP - Cardiology (Cardiology)  PLACE OF SERVICE:  [facility]  Advanced Directive information Does Patient Have a Medical Advance Directive?: Yes, Type of Advance Directive: Healthcare Power of Attorney, Does patient want to make changes to medical advance directive?: No - Patient declined  Allergies  Allergen Reactions   Lisinopril Other (See Comments)   Losartan Other (See Comments)   Memantine Other (See Comments)    combative   Peanut-Containing Drug Products    Tramadol Hives    Chief Complaint  Patient presents with   Medical Management of Chronic Issues    Routine follow up visit.   Health Maintenance    Zoster vaccine, tetanus/tdap, 2nd COVID booster, Flu vaccine     HPI: Patient is a 80 y.o. female for routine follow up.  She generally sleeps until 10;30 am and then wide open. Still loves to ride.  Continues to have appetite.  Sleeping well at night.  Uses buspar twice daily   Not taking lasix at all. No LE edema, shortness of breath, chest pain.   OA knees- taking tylenol twice daily.   Review of Systems:  Review of Systems  Constitutional:  Negative for chills, fever and weight loss.  HENT:  Negative for tinnitus.   Respiratory:  Negative for cough, sputum production and shortness of breath.   Cardiovascular:  Negative for chest pain, palpitations and leg swelling.  Gastrointestinal:  Negative for abdominal pain, constipation, diarrhea and heartburn.  Genitourinary:  Negative for dysuria, frequency and urgency.  Musculoskeletal:  Positive for joint pain. Negative for back pain, falls and myalgias.  Skin: Negative.   Neurological:  Negative for dizziness and headaches.  Psychiatric/Behavioral:  Positive for memory loss. Negative for depression. The patient does not have insomnia.    Past Medical History:  Diagnosis Date    Gallbladder disorder 2009   Per New Patient Packet    Heart disease    High blood pressure    Per New Patient Packet    High cholesterol    Past Surgical History:  Procedure Laterality Date   ABDOMINAL HYSTERECTOMY     Per New Patient Packet    CARDIAC SURGERY  2011   Open Heart Surgey, Per New Patient Packet     REPLACEMENT TOTAL KNEE Left 2010   Per New Patient Packet    Social History:   reports that she quit smoking about 33 years ago. Her smoking use included cigarettes. She has never used smokeless tobacco. She reports that she does not currently use drugs. She reports that she does not drink alcohol.  Family History  Problem Relation Age of Onset   Dementia Mother    Arthritis Mother    Breast cancer Mother    Heart attack Father    Arthritis Father    Heart attack Sister    Arthritis Sister    Breast cancer Sister     Medications: Patient's Medications  New Prescriptions   No medications on file  Previous Medications   ACETAMINOPHEN (TYLENOL) 325 MG TABLET    Take 650 mg by mouth daily.    ASCORBIC ACID (VITAMIN C) 1000 MG TABLET    Take 1 tablet (1,000 mg total) by mouth daily.   ATORVASTATIN (LIPITOR) 40 MG TABLET    TAKE 1 TABLET BY MOUTH EVERY DAY   BUSPIRONE (BUSPAR) 10 MG TABLET    TAKE 1 TABLET  BY MOUTH TWICE A DAY   CHOLECALCIFEROL (VITAMIN D3) 25 MCG (1000 UNIT) TABLET    Take 1 tablet (1,000 Units total) by mouth daily.   CITALOPRAM (CELEXA) 10 MG TABLET    Take 1 tablet (10 mg total) by mouth daily.   CYANOCOBALAMIN 1000 MCG TABLET    Take 1,000 mcg by mouth daily.   DICLOFENAC SODIUM (VOLTAREN) 1 % GEL    Apply 4 g topically 4 (four) times daily. To both knees. May apply 2 gm to hands QID for arthritis   DONEPEZIL (ARICEPT) 10 MG TABLET    TAKE 1 TABLET BY MOUTH EVERYDAY AT BEDTIME   FUROSEMIDE (LASIX) 20 MG TABLET    TAKE 1 TABLET BY MOUTH EVERY DAY AS NEEDED   INCONTINENCE SUPPLY DISPOSABLE MISC    XL incontinence brief to use as directed Dx:  incontinence of urine   TRAZODONE (DESYREL) 50 MG TABLET    TAKE 1 TABLET BY MOUTH EVERY DAY IN THE MORNING AND 1/2 TAB IN THE EVENING  Modified Medications   No medications on file  Discontinued Medications   No medications on file    Physical Exam:  Vitals:   06/16/21 1052  BP: 140/70  Pulse: (!) 57  Temp: (!) 97.1 F (36.2 C)  TempSrc: Temporal  SpO2: 96%  Weight: 216 lb 3.2 oz (98.1 kg)  Height: 5\' 4"  (1.626 m)   Body mass index is 37.11 kg/m. Wt Readings from Last 3 Encounters:  06/16/21 216 lb 3.2 oz (98.1 kg)  11/11/20 216 lb (98 kg)  07/15/20 242 lb (109.8 kg)    Physical Exam Constitutional:      General: She is not in acute distress.    Appearance: She is well-developed. She is not diaphoretic.  HENT:     Head: Normocephalic and atraumatic.     Mouth/Throat:     Pharynx: No oropharyngeal exudate.  Eyes:     Conjunctiva/sclera: Conjunctivae normal.     Pupils: Pupils are equal, round, and reactive to light.  Cardiovascular:     Rate and Rhythm: Normal rate and regular rhythm.     Heart sounds: Normal heart sounds.  Pulmonary:     Effort: Pulmonary effort is normal.     Breath sounds: Normal breath sounds.  Abdominal:     General: Bowel sounds are normal.     Palpations: Abdomen is soft.  Musculoskeletal:     Cervical back: Normal range of motion and neck supple.     Right lower leg: No edema.     Left lower leg: No edema.  Skin:    General: Skin is warm and dry.  Neurological:     Mental Status: She is alert.  Psychiatric:        Mood and Affect: Mood normal.    Labs reviewed: Basic Metabolic Panel: Recent Labs    07/15/20 1204 11/11/20 1600  NA 137 137  K 3.7 3.3*  CL 103 101  CO2 28 23  GLUCOSE 81 92  BUN 8 5*  CREATININE 1.04* 1.01*  CALCIUM 9.4 9.1  TSH  --  0.92   Liver Function Tests: Recent Labs    07/15/20 1204 11/11/20 1600  AST 16 16  ALT 10 8  BILITOT 0.6 0.7  PROT 6.9 7.0   No results for input(s): LIPASE,  AMYLASE in the last 8760 hours. No results for input(s): AMMONIA in the last 8760 hours. CBC: Recent Labs    07/15/20 1204 11/11/20 1600  WBC 4.5  5.2  NEUTROABS 2,079 3,099  HGB 13.4 13.8  HCT 41.3 43.1  MCV 84.1 85.7  PLT 181 170   Lipid Panel: Recent Labs    07/15/20 1204  CHOL 149  HDL 55  LDLCALC 79  TRIG 74  CHOLHDL 2.7   TSH: Recent Labs    11/11/20 1600  TSH 0.92   A1C: No results found for: HGBA1C   Assessment/Plan 1. Insomnia, unspecified type -ongoing but stable. Will have her reduce trazodone to 50 mg daily at bedtime only at this time. Continue lifestyle modifications.  - traZODone (DESYREL) 50 MG tablet; Take 1 tablet (50 mg total) by mouth at bedtime.  Dispense: 90 tablet; Refill: 1  2. Anxiety and depression -ongoing, in attempts for dose reduction will have her only take trazodone at bedtime.  - traZODone (DESYREL) 50 MG tablet; Take 1 tablet (50 mg total) by mouth at bedtime.  Dispense: 90 tablet; Refill: 1  3. Coronary artery disease involving native coronary artery of native heart without angina pectoris -stable, encouraged ASA 81 mg daily in addition to statin  4. Essential hypertension -Blood pressure well controlled Continue current medications Recheck metabolic panel   5. Mixed hyperlipidemia -continues on lipitor, will follow up labs at next visit  6. Need for influenza vaccination - Flu Vaccine QUAD High Dose(Fluad)  Next appt: 6 months.  Laurie Mays. Biagio Borg  [facility] [phone]

## 2021-07-06 ENCOUNTER — Other Ambulatory Visit: Payer: Self-pay | Admitting: Family

## 2021-07-06 DIAGNOSIS — F419 Anxiety disorder, unspecified: Secondary | ICD-10-CM

## 2021-08-05 ENCOUNTER — Ambulatory Visit: Payer: Medicare PPO | Admitting: Podiatry

## 2021-08-05 ENCOUNTER — Encounter: Payer: Self-pay | Admitting: Podiatry

## 2021-08-05 DIAGNOSIS — B351 Tinea unguium: Secondary | ICD-10-CM | POA: Diagnosis not present

## 2021-08-05 DIAGNOSIS — N183 Chronic kidney disease, stage 3 unspecified: Secondary | ICD-10-CM | POA: Diagnosis not present

## 2021-08-05 NOTE — Progress Notes (Signed)
°  Subjective:  Patient ID: Laurie Mays, female    DOB: [birth year],  MRN: [id number]  Chief Complaint  Patient presents with   Nail Problem    Trim nails    81 y.o. female presents with the above complaint. History confirmed with patient. Here for care of her nails. Presents with her son.  Objective:  Physical Exam: warm, good capillary refill, no trophic changes or ulcerative lesions, normal DP and PT pulses, and normal sensory exam. Nails thickened and dystrophic  No images are attached to the encounter.   Assessment:   1. Onychomycosis   2. Stage 3 chronic kidney disease, unspecified whether stage 3a or 3b CKD ([place])    Plan:  Patient was evaluated and treated and all questions answered.  Onychomycosis and CKD -Nails palliatively debrided secondary to pain   Procedure: Nail Debridement Type of Debridement: manual, sharp debridement. Instrumentation: Nail nipper, rotary burr. Number of Nails: 10   Return in about 3 months (around 11/03/2021) for Diabetic Foot Care.

## 2021-10-12 ENCOUNTER — Ambulatory Visit: Payer: Medicare PPO | Admitting: Family

## 2021-10-21 ENCOUNTER — Telehealth: Payer: Self-pay

## 2021-10-21 NOTE — Telephone Encounter (Signed)
Called Sister, no answer, Called Melony, no answer. Left message on Melony's voicemail to return call.  ?

## 2021-10-21 NOTE — Telephone Encounter (Signed)
Per our records she is only using the buspar twice daily, she can use up to three times daily.  ?

## 2021-10-21 NOTE — Telephone Encounter (Signed)
Melony called back stating she believe she knows why her mom was up last night like she was. She gave her benadryl because her fingers were itching and nose was running. Most days she only takes one Buspar but aware they can add second dose if needed. ?

## 2021-10-21 NOTE — Telephone Encounter (Signed)
Patient's daughter Karen Kays called stating the patient is getting up in middle of night confused and wonders if they can give her an extra dose of buspar. [phone]/RJJOAC [phone] ? ?To Areatha Keas, NP ?

## 2021-11-04 ENCOUNTER — Other Ambulatory Visit: Payer: Self-pay | Admitting: Nurse Practitioner

## 2021-11-04 DIAGNOSIS — F32A Depression, unspecified: Secondary | ICD-10-CM

## 2021-11-04 DIAGNOSIS — E782 Mixed hyperlipidemia: Secondary | ICD-10-CM

## 2021-11-04 NOTE — Telephone Encounter (Signed)
Patient has request refill on medication "Atorvastatin"refilled 01/07/2021, and "Citalopram" refilled 07/06/2021. Medications have High Risk Warnings. Medication pend and sent to PCP Dewaine Oats Carlos American, NP for approval. Please Advise. ?

## 2021-11-08 ENCOUNTER — Ambulatory Visit: Payer: Medicare PPO | Admitting: Podiatry

## 2021-11-11 ENCOUNTER — Ambulatory Visit: Payer: Medicare PPO | Admitting: Podiatry

## 2021-11-11 ENCOUNTER — Encounter: Payer: Self-pay | Admitting: Podiatry

## 2021-11-11 DIAGNOSIS — B351 Tinea unguium: Secondary | ICD-10-CM | POA: Diagnosis not present

## 2021-11-21 NOTE — Progress Notes (Signed)
?  Subjective:  ?Patient ID: Laurie Mays, female    DOB: 1940/12/19,  MRN: [id number] ? ?Laurie Mays presents to clinic today for painful elongated mycotic toenails 1-5 bilaterally which are tender when wearing enclosed shoe gear. Pain is relieved with periodic professional debridement. ? ?Patient is accompanied by her son on today's visit. Husband is waiting in the lobby. ? ?New problem(s): None.  ? ?PCP is [name], NP , and last visit was June 16, 2021. ? ?Allergies  ?Allergen Reactions  ? Lisinopril Other (See Comments)  ? Losartan Other (See Comments)  ? Memantine Other (See Comments)  ?  combative  ? Peanut-Containing Drug Products   ? Tramadol Hives  ? ? ?Review of Systems: Negative except as noted in the HPI. ?Objective:  ? ?Constitutional Laurie Mays is a pleasant 81 y.o. African American female, obese in NAD. AAO x 3.   ?Vascular CFT <3 seconds b/l LE. Palpable DP pulse(s) b/l LE. Palpable PT pulse(s) b/l LE. Pedal hair sparse. No pain with calf compression b/l. Trace edema noted BLE. Evidence of chronic venous insufficiency b/l LE.  ?Neurologic Normal speech. Oriented to person, place, and time. Protective sensation intact 5/5 intact bilaterally with 10g monofilament b/l. Vibratory sensation intact b/l.  ?Dermatologic Pedal skin is warm and supple b/l LE. No open wounds b/l LE. No interdigital macerations noted b/l LE. Toenails 1-5 b/l elongated, discolored, dystrophic, thickened, crumbly with subungual debris and tenderness to dorsal palpation. Porokeratotic lesion(s) submet head 5 b/l. No erythema, no edema, no drainage, no fluctuance.  ?Orthopedic: Muscle strength 5/5 to all lower extremity muscle groups bilaterally. No pain, crepitus or joint limitation noted with ROM bilateral LE. HAV with bunion deformity noted b/l LE.  ? ?Radiographs: None ? ?Last A1c:  ?   ? View : No data to display.  ?  ?  ?  ? ?  ?Assessment:  ? ?1. Onychomycosis   ? ?Plan:  ?Patient was evaluated and  treated and all questions answered. ?Consent given for treatment as described below: ?-Patient was evaluated and treated. All patient's and/or POA's questions/concerns answered on today's visit. ?-As a courtesy, porokeratoses pared and enucleated without incident. Total lesions=2.. ?-Mycotic toenails 1-5 bilaterally were debrided in length and girth with sterile nail nippers and dremel without incident. ?-Patient/POA to call should there be question/concern in the interim. ? ?Return in about 3 months (around 02/11/2022). ? ?Freddie Breech, DPM ?

## 2021-12-05 ENCOUNTER — Other Ambulatory Visit: Payer: Self-pay | Admitting: Nurse Practitioner

## 2021-12-05 DIAGNOSIS — F419 Anxiety disorder, unspecified: Secondary | ICD-10-CM

## 2021-12-07 ENCOUNTER — Other Ambulatory Visit: Payer: Self-pay | Admitting: Nurse Practitioner

## 2021-12-07 DIAGNOSIS — G4700 Insomnia, unspecified: Secondary | ICD-10-CM

## 2021-12-07 DIAGNOSIS — F32A Depression, unspecified: Secondary | ICD-10-CM

## 2021-12-14 NOTE — Progress Notes (Unsigned)
Careteam: Patient Care Team: [name], NP as PCP - General (Geriatric Medicine) [name], MD as PCP - Cardiology (Cardiology)  PLACE OF SERVICE:  [facility]  Advanced Directive information Does Patient Have a Medical Advance Directive?: Yes, Does patient want to make changes to medical advance directive?: No - Patient declined  Allergies  Allergen Reactions   Lisinopril Other (See Comments)   Losartan Other (See Comments)   Memantine Other (See Comments)    combative   Peanut-Containing Drug Products    Tramadol Hives    Chief Complaint  Patient presents with   Medical Management of Chronic Issues    6 month follow-up. Discuss need for td/tdap and covid booster or post pone if patient refuses.      HPI: Patient is a 81 y.o. female for routine follow up.   Anxiety/depression/insomnia- continues on buspar, trazodone and celexa which has maintain symptoms.   Dementia- ongoing, continues on Aricept, unable to tolerate namenda  Hyperlipidemia- due for lipids today, continues on lipitor  CAD- ?? On asa   OA of knees- continues on tylenol and voltaren gel PRN    Review of Systems:  Review of Systems  Unable to perform ROS: Dementia   Past Medical History:  Diagnosis Date   Gallbladder disorder 2009   Per New Patient Packet    Heart disease    High blood pressure    Per New Patient Packet    High cholesterol    Past Surgical History:  Procedure Laterality Date   ABDOMINAL HYSTERECTOMY     Per New Patient Packet    CARDIAC SURGERY  2011   Open Heart Surgey, Per New Patient Packet     REPLACEMENT TOTAL KNEE Left 2010   Per New Patient Packet    Social History:   reports that she quit smoking about 34 years ago. Her smoking use included cigarettes. She has never used smokeless tobacco. She reports that she does not currently use drugs. She reports that she does not drink alcohol.  Family History  Problem Relation Age of Onset    Dementia Mother    Arthritis Mother    Breast cancer Mother    Heart attack Father    Arthritis Father    Heart attack Sister    Arthritis Sister    Breast cancer Sister     Medications: Patient's Medications  New Prescriptions   ASPIRIN EC 81 MG TABLET    Take 1 tablet (81 mg total) by mouth daily. Swallow whole.  Previous Medications   ACETAMINOPHEN (TYLENOL) 325 MG TABLET    Take 650 mg by mouth daily.    ASCORBIC ACID (VITAMIN C) 1000 MG TABLET    Take 1 tablet (1,000 mg total) by mouth daily.   ATORVASTATIN (LIPITOR) 40 MG TABLET    TAKE 1 TABLET BY MOUTH EVERY DAY   BUSPIRONE (BUSPAR) 10 MG TABLET    TAKE 1 TABLET BY MOUTH TWICE A DAY   CHOLECALCIFEROL (VITAMIN D3) 25 MCG (1000 UNIT) TABLET    Take 1 tablet (1,000 Units total) by mouth daily.   CITALOPRAM (CELEXA) 10 MG TABLET    TAKE 1 TABLET BY MOUTH EVERY DAY   CYANOCOBALAMIN 1000 MCG TABLET    Take 1,000 mcg by mouth daily.   DICLOFENAC SODIUM (VOLTAREN) 1 % GEL    Apply 4 g topically 4 (four) times daily. To both knees. May apply 2 gm to hands QID for arthritis   DONEPEZIL (ARICEPT) 10  MG TABLET    TAKE 1 TABLET BY MOUTH EVERYDAY AT BEDTIME   INCONTINENCE SUPPLY DISPOSABLE MISC    XL incontinence brief to use as directed Dx: incontinence of urine   TRAZODONE (DESYREL) 50 MG TABLET    TAKE 1 TABLET BY MOUTH EVERY DAY IN THE MORNING AND 1/2 OF A TABLET IN THE EVENING  Modified Medications   No medications on file  Discontinued Medications   No medications on file    Physical Exam:  Vitals:   12/15/21 1055  BP: 136/70  Pulse: (!) 59  Resp: 20  Temp: 97.6 F (36.4 C)  TempSrc: Temporal  SpO2: 96%  Weight: 219 lb (99.3 kg)  Height: $Remove'5\' 4"'qAbtHwU$  (1.626 m)   Body mass index is 37.59 kg/m. Wt Readings from Last 3 Encounters:  12/15/21 219 lb (99.3 kg)  06/16/21 216 lb 3.2 oz (98.1 kg)  11/11/20 216 lb (98 kg)    Physical Exam Constitutional:      General: She is not in acute distress.    Appearance: She is  well-developed. She is obese. She is not diaphoretic.  HENT:     Head: Normocephalic and atraumatic.     Mouth/Throat:     Pharynx: No oropharyngeal exudate.  Eyes:     Conjunctiva/sclera: Conjunctivae normal.     Pupils: Pupils are equal, round, and reactive to light.  Cardiovascular:     Rate and Rhythm: Normal rate and regular rhythm.     Heart sounds: Normal heart sounds.  Pulmonary:     Effort: Pulmonary effort is normal.     Breath sounds: Normal breath sounds.  Abdominal:     General: Bowel sounds are normal.     Palpations: Abdomen is soft.  Musculoskeletal:     Cervical back: Normal range of motion and neck supple.     Right lower leg: No edema.     Left lower leg: No edema.  Skin:    General: Skin is warm and dry.  Neurological:     Mental Status: She is alert.  Psychiatric:        Mood and Affect: Mood normal.    Labs reviewed: Basic Metabolic Panel: No results for input(s): NA, K, CL, CO2, GLUCOSE, BUN, CREATININE, CALCIUM, MG, PHOS, TSH in the last 8760 hours. Liver Function Tests: No results for input(s): AST, ALT, ALKPHOS, BILITOT, PROT, ALBUMIN in the last 8760 hours. No results for input(s): LIPASE, AMYLASE in the last 8760 hours. No results for input(s): AMMONIA in the last 8760 hours. CBC: No results for input(s): WBC, NEUTROABS, HGB, HCT, MCV, PLT in the last 8760 hours. Lipid Panel: No results for input(s): CHOL, HDL, LDLCALC, TRIG, CHOLHDL, LDLDIRECT in the last 8760 hours. TSH: No results for input(s): TSH in the last 8760 hours. A1C: No results found for: HGBA1C   Assessment/Plan 1. Essential hypertension -Blood pressure well controlled Continue current medications Recheck metabolic panel - CBC with Differential/Platelet - CMP with eGFR([facility])  2. Coronary artery disease involving native coronary artery of native heart without angina pectoris -to make sure she is taking asa 81 mg EC daily -no chest pains notd - CBC with  Differential/Platelet - CMP with eGFR([facility]) - Lipid panel  3. Allergic rhinitis, unspecified seasonality, unspecified trigger -well controlled  4. Stage 3 chronic kidney disease, unspecified whether stage 3a or 3b CKD ([facility]) -Chronic and stable Encourage proper hydration Follow metabolic panel Avoid nephrotoxic meds (NSAIDS) - CMP with eGFR([facility])  5. Primary insomnia -stable, has good nights  but also has bad night but mostly well controlled.   6. Generalized anxiety disorder Well controlled on celexa and buspar  7. Mixed hyperlipidemia -continues on lipitor with dietary modifications.  - Lipid panel  8. Class 2 drug-induced obesity with serious comorbidity and body mass index (BMI) of 37.0 to 37.9 in adult --education provided on healthy weight loss through increase in physical activity and proper nutrition. Exercise limited due to OA of bilateral knees.   9. Bilateral leg edema -stable, no worsening edema. Off lasix.   10. Primary osteoarthritis involving multiple joints -ongoing knee pain, reports tylenol helps control pain but decrease in mobility noted.  - CMP with eGFR([facility])  11. Dementia without behavioral disturbance (HCC) -Stable, no acute changes in cognitive or functional status, continue supportive care. Continues on aricept daily    Return in about 6 months (around 06/16/2022) for routine follow up. Carlos American. [place] [phone]

## 2021-12-15 ENCOUNTER — Encounter: Payer: Self-pay | Admitting: Nurse Practitioner

## 2021-12-15 ENCOUNTER — Ambulatory Visit: Payer: Medicare PPO | Admitting: Nurse Practitioner

## 2021-12-15 VITALS — BP 136/70 | HR 59 | Temp 97.6°F | Resp 20 | Ht 64.0 in | Wt 219.0 lb

## 2021-12-15 DIAGNOSIS — R600 Localized edema: Secondary | ICD-10-CM | POA: Diagnosis not present

## 2021-12-15 DIAGNOSIS — E661 Drug-induced obesity: Secondary | ICD-10-CM | POA: Diagnosis not present

## 2021-12-15 DIAGNOSIS — I10 Essential (primary) hypertension: Secondary | ICD-10-CM

## 2021-12-15 DIAGNOSIS — M159 Polyosteoarthritis, unspecified: Secondary | ICD-10-CM | POA: Diagnosis not present

## 2021-12-15 DIAGNOSIS — I2510 Atherosclerotic heart disease of native coronary artery without angina pectoris: Secondary | ICD-10-CM

## 2021-12-15 DIAGNOSIS — Z6841 Body Mass Index (BMI) 40.0 and over, adult: Secondary | ICD-10-CM

## 2021-12-15 DIAGNOSIS — N183 Chronic kidney disease, stage 3 unspecified: Secondary | ICD-10-CM

## 2021-12-15 DIAGNOSIS — F5101 Primary insomnia: Secondary | ICD-10-CM | POA: Diagnosis not present

## 2021-12-15 DIAGNOSIS — F411 Generalized anxiety disorder: Secondary | ICD-10-CM

## 2021-12-15 DIAGNOSIS — J309 Allergic rhinitis, unspecified: Secondary | ICD-10-CM

## 2021-12-15 DIAGNOSIS — Z6837 Body mass index (BMI) 37.0-37.9, adult: Secondary | ICD-10-CM

## 2021-12-15 DIAGNOSIS — E782 Mixed hyperlipidemia: Secondary | ICD-10-CM

## 2021-12-15 DIAGNOSIS — F0390 Unspecified dementia without behavioral disturbance: Secondary | ICD-10-CM

## 2021-12-15 MED ORDER — ASPIRIN 81 MG PO TBEC: 81.0000 mg | DELAYED_RELEASE_TABLET | Freq: Every day | ORAL | 12 refills | Status: DC

## 2021-12-16 LAB — CBC WITH DIFFERENTIAL/PLATELET
Absolute Monocytes: 370 cells/uL (ref 200–950)
Basophils Absolute: 21 cells/uL (ref 0–200)
Basophils Relative: 0.5 %
Eosinophils Absolute: 130 cells/uL (ref 15–500)
Eosinophils Relative: 3.1 %
HCT: 41.7 % (ref 35.0–45.0)
Hemoglobin: 13.7 g/dL (ref 11.7–15.5)
Lymphs Abs: 1642 cells/uL (ref 850–3900)
MCH: 28.4 pg (ref 27.0–33.0)
MCHC: 32.9 g/dL (ref 32.0–36.0)
MCV: 86.5 fL (ref 80.0–100.0)
MPV: 11.1 fL (ref 7.5–12.5)
Monocytes Relative: 8.8 %
Neutro Abs: 2037 cells/uL (ref 1500–7800)
Neutrophils Relative %: 48.5 %
Platelets: 195 10*3/uL (ref 140–400)
RBC: 4.82 10*6/uL (ref 3.80–5.10)
RDW: 12.9 % (ref 11.0–15.0)
Total Lymphocyte: 39.1 %
WBC: 4.2 10*3/uL (ref 3.8–10.8)

## 2021-12-16 LAB — LIPID PANEL
Cholesterol: 157 mg/dL (ref <200)
HDL: 61 mg/dL (ref >=50)
LDL Cholesterol (Calc): 82 mg/dL (calc)
Non-HDL Cholesterol (Calc): 96 mg/dL (calc) (ref <130)
Total CHOL/HDL Ratio: 2.6 (calc) (ref <5.0)
Triglycerides: 64 mg/dL (ref <150)

## 2021-12-16 LAB — COMPLETE METABOLIC PANEL WITH GFR
AG Ratio: 1.2 (calc) (ref 1.0–2.5)
ALT: 11 U/L (ref 6–29)
AST: 16 U/L (ref 10–35)
Albumin: 3.9 g/dL (ref 3.6–5.1)
Alkaline phosphatase (APISO): 77 U/L (ref 37–153)
BUN/Creatinine Ratio: 7 (calc) (ref 6–22)
BUN: 7 mg/dL (ref 7–25)
CO2: 26 mmol/L (ref 20–32)
Calcium: 9.6 mg/dL (ref 8.6–10.4)
Chloride: 105 mmol/L (ref 98–110)
Creat: 0.96 mg/dL — ABNORMAL HIGH (ref 0.60–0.95)
Globulin: 3.3 g/dL (calc) (ref 1.9–3.7)
Glucose, Bld: 87 mg/dL (ref 65–99)
Potassium: 4 mmol/L (ref 3.5–5.3)
Sodium: 139 mmol/L (ref 135–146)
Total Bilirubin: 0.5 mg/dL (ref 0.2–1.2)
Total Protein: 7.2 g/dL (ref 6.1–8.1)
eGFR: 59 mL/min/{1.73_m2} — ABNORMAL LOW (ref >=60)

## 2021-12-17 ENCOUNTER — Ambulatory Visit: Payer: Medicare PPO | Admitting: Nurse Practitioner

## 2021-12-24 ENCOUNTER — Encounter: Payer: Self-pay | Admitting: Orthopedic Surgery

## 2021-12-24 ENCOUNTER — Telehealth (INDEPENDENT_AMBULATORY_CARE_PROVIDER_SITE_OTHER): Payer: Medicare PPO | Admitting: Orthopedic Surgery

## 2021-12-24 ENCOUNTER — Telehealth: Payer: Self-pay

## 2021-12-24 DIAGNOSIS — F0390 Unspecified dementia without behavioral disturbance: Secondary | ICD-10-CM | POA: Diagnosis not present

## 2021-12-24 DIAGNOSIS — R519 Headache, unspecified: Secondary | ICD-10-CM | POA: Diagnosis not present

## 2021-12-24 NOTE — Patient Instructions (Signed)
Recommend taking blood pressures every evening x 1 week- if pressures > 150/90 consistently contact provider  Continue tylenol prn for headache pain  Encourage hydration with water  Make sure she is eating a few times daily

## 2021-12-24 NOTE — Telephone Encounter (Signed)
[name], rentfrow are scheduled for a virtual visit with your provider today.    Just as we do with appointments in the office, we must obtain your consent to participate.  Your consent will be active for this visit and any virtual visit you may have with one of our providers in the next 365 days.    If you have a MyChart account, I can also send a copy of this consent to you electronically.  All virtual visits are billed to your insurance company just like a traditional visit in the office.  As this is a virtual visit, video technology does not allow for your provider to perform a traditional examination.  This may limit your provider's ability to fully assess your condition.  If your provider identifies any concerns that need to be evaluated in person or the need to arrange testing such as labs, EKG, etc, we will make arrangements to do so.    Although advances in technology are sophisticated, we cannot ensure that it will always work on either your end or our end.  If the connection with a video visit is poor, we may have to switch to a telephone visit.  With either a video or telephone visit, we are not always able to ensure that we have a secure connection.   I need to obtain your verbal consent now.   Are you willing to proceed with your visit today?   Laurie Mays has provided verbal consent on 12/24/2021 for a virtual visit (video or telephone).   [name], CMA 12/24/2021  11:41 AM

## 2021-12-24 NOTE — Progress Notes (Signed)
Careteam: Patient Care Team: [name], NP as PCP - General (Geriatric Medicine) [name], MD as PCP - Cardiology (Cardiology)  Seen by: [name], AGNP-C  PLACE OF SERVICE:  [facility]  Advanced Directive information Does Patient Have a Medical Advance Directive?: Yes, Type of Advance Directive: Healthcare Power of Attorney, Does patient want to make changes to medical advance directive?: No - Patient declined  Allergies  Allergen Reactions   Lisinopril Other (See Comments)   Losartan Other (See Comments)   Memantine Other (See Comments)    combative   Peanut-Containing Drug Products    Tramadol Hives    Chief Complaint  Patient presents with   Acute Visit    Complains of headaches at bedtime. Patient stated it has been going on for about 2-3 nights. The headaches go on for about 30 minutes. Patient is with daughter, Laurie Mays.      HPI: Patient is a 81 y.o. female seen today via video visit due to headaches.   Daughter present during encounter.   She is a poor historian due to dementia. Evening headaches x 2 days. She did not have one last night. Husband gave her some tylenol and headache resolved. Family has been taking blood pressures and report they are normal.  No recent behaviors, remains on Aricept for dementia. Denies chest pain, sob, blurred vision, cold symptoms.   Review of Systems:  Review of Systems  Constitutional:  Negative for chills, fever, malaise/fatigue and weight loss.  HENT:  Negative for congestion.   Respiratory:  Negative for cough, shortness of breath and wheezing.   Cardiovascular:  Negative for chest pain and leg swelling.  Gastrointestinal:  Negative for nausea and vomiting.  Neurological:  Positive for headaches. Negative for dizziness and weakness.  Psychiatric/Behavioral:  Positive for depression and memory loss. The patient is nervous/anxious and has insomnia.     Past Medical History:  Diagnosis Date    Gallbladder disorder 2009   Per New Patient Packet    Heart disease    High blood pressure    Per New Patient Packet    High cholesterol    Past Surgical History:  Procedure Laterality Date   ABDOMINAL HYSTERECTOMY     Per New Patient Packet    CARDIAC SURGERY  2011   Open Heart Surgey, Per New Patient Packet     REPLACEMENT TOTAL KNEE Left 2010   Per New Patient Packet    Social History:   reports that she quit smoking about 34 years ago. Her smoking use included cigarettes. She has never used smokeless tobacco. She reports that she does not currently use drugs. She reports that she does not drink alcohol.  Family History  Problem Relation Age of Onset   Dementia Mother    Arthritis Mother    Breast cancer Mother    Heart attack Father    Arthritis Father    Heart attack Sister    Arthritis Sister    Breast cancer Sister     Medications: Patient's Medications  New Prescriptions   No medications on file  Previous Medications   ACETAMINOPHEN (TYLENOL) 325 MG TABLET    Take 650 mg by mouth daily.    ASCORBIC ACID (VITAMIN C) 1000 MG TABLET    Take 1 tablet (1,000 mg total) by mouth daily.   ASPIRIN EC 81 MG TABLET    Take 1 tablet (81 mg total) by mouth daily. Swallow whole.   ATORVASTATIN (LIPITOR) 40 MG TABLET  TAKE 1 TABLET BY MOUTH EVERY DAY   BUSPIRONE (BUSPAR) 10 MG TABLET    TAKE 1 TABLET BY MOUTH TWICE A DAY   CHOLECALCIFEROL (VITAMIN D3) 25 MCG (1000 UNIT) TABLET    Take 1 tablet (1,000 Units total) by mouth daily.   CITALOPRAM (CELEXA) 10 MG TABLET    TAKE 1 TABLET BY MOUTH EVERY DAY   CYANOCOBALAMIN 1000 MCG TABLET    Take 1,000 mcg by mouth daily.   DICLOFENAC SODIUM (VOLTAREN) 1 % GEL    Apply 4 g topically 4 (four) times daily. To both knees. May apply 2 gm to hands QID for arthritis   DONEPEZIL (ARICEPT) 10 MG TABLET    TAKE 1 TABLET BY MOUTH EVERYDAY AT BEDTIME   INCONTINENCE SUPPLY DISPOSABLE MISC    XL incontinence brief to use as directed Dx:  incontinence of urine   TRAZODONE (DESYREL) 50 MG TABLET    TAKE 1 TABLET BY MOUTH EVERY DAY IN THE MORNING AND 1/2 OF A TABLET IN THE EVENING  Modified Medications   No medications on file  Discontinued Medications   No medications on file    Physical Exam:  There were no vitals filed for this visit. There is no height or weight on file to calculate BMI. Wt Readings from Last 3 Encounters:  12/15/21 219 lb (99.3 kg)  06/16/21 216 lb 3.2 oz (98.1 kg)  11/11/20 216 lb (98 kg)    Physical Exam Vitals reviewed.  Constitutional:      General: She is not in acute distress. Neurological:     Mental Status: She is alert.   Unable to perform complete exam due to video visit  Labs reviewed: Basic Metabolic Panel: Recent Labs    12/15/21 1106  NA 139  K 4.0  CL 105  CO2 26  GLUCOSE 87  BUN 7  CREATININE 0.96*  CALCIUM 9.6   Liver Function Tests: Recent Labs    12/15/21 1106  AST 16  ALT 11  BILITOT 0.5  PROT 7.2   No results for input(s): "LIPASE", "AMYLASE" in the last 8760 hours. No results for input(s): "AMMONIA" in the last 8760 hours. CBC: Recent Labs    12/15/21 1106  WBC 4.2  NEUTROABS 2,037  HGB 13.7  HCT 41.7  MCV 86.5  PLT 195   Lipid Panel: Recent Labs    12/15/21 1106  CHOL 157  HDL 61  LDLCALC 82  TRIG 64  CHOLHDL 2.6   TSH: No results for input(s): "TSH" in the last 8760 hours. A1C: No results found for: "HGBA1C"   Assessment/Plan 1. Acute nonintractable headache, unspecified headache type - evening headache x 2 days- resolved with tylenol - no episodes yesterday - recommend blood pressures every evening x 1 week- reports if bp > 150/90 - cont tylenol for pain  2. Dementia without behavioral disturbance (HCC) - no behavioral outbursts - cont Aricept  Total time: 7 minutes. Greater than 50% of total time spent doing patient education regarding symptom/medication management regarding headaches.   Virtual Visit   I  connected with Kristopher Oppenheim by virtual visit and verified that I am speaking with the correct person using two identifiers.   Patient: Laurie Mays Patient location: home Provider: [name] AGNP-C Provider location: [facility]  I discussed the limitations, risks, security and privacy concerns of performing an evaluation and management service by telephone and the availability of in person appointments. I also discussed with the patient that there may be  a patient responsible charge related to this service. The patient expressed understanding and agreed to proceed.    I discussed the assessment and treatment plan with the patient. The patient was provided an opportunity to ask questions and all were answered. The patient agreed with the plan and demonstrated an understanding of the instructions.     The patient was advised to call back or seek an in-person evaluation if the symptoms worsen or if the condition fails to improve as anticipated.   I provided 7 minutes of non-face-to-face time during this encounter.   [place], NP  Avs printed and mailed    Next appt: Visit date not found  [place] [phone]

## 2021-12-24 NOTE — Progress Notes (Signed)
This service is provided via telemedicine  No vital signs collected/recorded due to the encounter was a telemedicine visit.   Location of patient (ex: home, work):  Home  Patient consents to a telephone visit:  Yes see telephone/video visit consent dated 12/24/21.  Location of the provider (ex: office, home):  [facility]   Name of any referring provider:  n/a  Names of all persons participating in the telemedicine service and their role in the encounter:  Laurie Mays, CMA, Laurie E.F[place], NP, patient, and patients daughter, Laurie Mays   Time spent on call:  9 min

## 2022-01-31 DIAGNOSIS — G309 Alzheimer's disease, unspecified: Secondary | ICD-10-CM | POA: Diagnosis not present

## 2022-01-31 DIAGNOSIS — F0150 Vascular dementia without behavioral disturbance: Secondary | ICD-10-CM | POA: Diagnosis not present

## 2022-01-31 DIAGNOSIS — I739 Peripheral vascular disease, unspecified: Secondary | ICD-10-CM | POA: Diagnosis not present

## 2022-01-31 DIAGNOSIS — F419 Anxiety disorder, unspecified: Secondary | ICD-10-CM | POA: Diagnosis not present

## 2022-01-31 DIAGNOSIS — E669 Obesity, unspecified: Secondary | ICD-10-CM | POA: Diagnosis not present

## 2022-01-31 DIAGNOSIS — E785 Hyperlipidemia, unspecified: Secondary | ICD-10-CM | POA: Diagnosis not present

## 2022-01-31 DIAGNOSIS — F325 Major depressive disorder, single episode, in full remission: Secondary | ICD-10-CM | POA: Diagnosis not present

## 2022-01-31 DIAGNOSIS — I2510 Atherosclerotic heart disease of native coronary artery without angina pectoris: Secondary | ICD-10-CM | POA: Diagnosis not present

## 2022-01-31 DIAGNOSIS — I252 Old myocardial infarction: Secondary | ICD-10-CM | POA: Diagnosis not present

## 2022-02-07 ENCOUNTER — Encounter: Payer: Medicare PPO | Admitting: Nurse Practitioner

## 2022-02-08 ENCOUNTER — Ambulatory Visit (INDEPENDENT_AMBULATORY_CARE_PROVIDER_SITE_OTHER): Payer: Medicare PPO | Admitting: Nurse Practitioner

## 2022-02-08 ENCOUNTER — Encounter: Payer: Self-pay | Admitting: Nurse Practitioner

## 2022-02-08 DIAGNOSIS — Z0000 Encounter for general adult medical examination without abnormal findings: Secondary | ICD-10-CM | POA: Diagnosis not present

## 2022-02-08 NOTE — Patient Instructions (Signed)
[name] , Thank you for taking time to come for your Medicare Wellness Visit. I appreciate your ongoing commitment to your health goals. Please review the following plan we discussed and let me know if I can assist you in the future.   Screening recommendations/referrals: Colonoscopy aged out Mammogram aged out Bone Density -declined Recommended yearly ophthalmology/optometry visit for glaucoma screening and checkup Recommended yearly dental visit for hygiene and checkup  Vaccinations: Influenza vaccine yearly in october Pneumococcal vaccine up to date Tdap vaccine DUE- recommend to get at your local pharmacy    Shingles vaccine DUE- recommend to get at your local pharmacy       Advanced directives: on file.   Conditions/risks identified: advanced age, progressive memory loss  Next appointment: yearly for awv   Preventive Care 14 Years and Older, Female Preventive care refers to lifestyle choices and visits with your health care provider that can promote health and wellness. What does preventive care include? A yearly physical exam. This is also called an annual well check. Dental exams once or twice a year. Routine eye exams. Ask your health care provider how often you should have your eyes checked. Personal lifestyle choices, including: Daily care of your teeth and gums. Regular physical activity. Eating a healthy diet. Avoiding tobacco and drug use. Limiting alcohol use. Practicing safe sex. Taking low-dose aspirin every day. Taking vitamin and mineral supplements as recommended by your health care provider. What happens during an annual well check? The services and screenings done by your health care provider during your annual well check will depend on your age, overall health, lifestyle risk factors, and family history of disease. Counseling  Your health care provider may ask you questions about your: Alcohol use. Tobacco use. Drug use. Emotional  well-being. Home and relationship well-being. Sexual activity. Eating habits. History of falls. Memory and ability to understand (cognition). Work and work Astronomer. Reproductive health. Screening  You may have the following tests or measurements: Height, weight, and BMI. Blood pressure. Lipid and cholesterol levels. These may be checked every 5 years, or more frequently if you are over 1 years old. Skin check. Lung cancer screening. You may have this screening every year starting at age 72 if you have a 30-pack-year history of smoking and currently smoke or have quit within the past 15 years. Fecal occult blood test (FOBT) of the stool. You may have this test every year starting at age 20. Flexible sigmoidoscopy or colonoscopy. You may have a sigmoidoscopy every 5 years or a colonoscopy every 10 years starting at age 63. Hepatitis C blood test. Hepatitis B blood test. Sexually transmitted disease (STD) testing. Diabetes screening. This is done by checking your blood sugar (glucose) after you have not eaten for a while (fasting). You may have this done every 1-3 years. Bone density scan. This is done to screen for osteoporosis. You may have this done starting at age 62. Mammogram. This may be done every 1-2 years. Talk to your health care provider about how often you should have regular mammograms. Talk with your health care provider about your test results, treatment options, and if necessary, the need for more tests. Vaccines  Your health care provider may recommend certain vaccines, such as: Influenza vaccine. This is recommended every year. Tetanus, diphtheria, and acellular pertussis (Tdap, Td) vaccine. You may need a Td booster every 10 years. Zoster vaccine. You may need this after age 56. Pneumococcal 13-valent conjugate (PCV13) vaccine. One dose is recommended after age 56.  Pneumococcal polysaccharide (PPSV23) vaccine. One dose is recommended after age 4. Talk to your  health care provider about which screenings and vaccines you need and how often you need them. This information is not intended to replace advice given to you by your health care provider. Make sure you discuss any questions you have with your health care provider. Document Released: 07/24/2015 Document Revised: 03/16/2016 Document Reviewed: 04/28/2015 Elsevier Interactive Patient Education  2017 [place] Prevention in the Home Falls can cause injuries. They can happen to people of all ages. There are many things you can do to make your home safe and to help prevent falls. What can I do on the outside of my home? Regularly fix the edges of walkways and driveways and fix any cracks. Remove anything that might make you trip as you walk through a door, such as a raised step or threshold. Trim any bushes or trees on the path to your home. Use bright outdoor lighting. Clear any walking paths of anything that might make someone trip, such as rocks or tools. Regularly check to see if handrails are loose or broken. Make sure that both sides of any steps have handrails. Any raised decks and porches should have guardrails on the edges. Have any leaves, snow, or ice cleared regularly. Use sand or salt on walking paths during winter. Clean up any spills in your garage right away. This includes oil or grease spills. What can I do in the bathroom? Use night lights. Install grab bars by the toilet and in the tub and shower. Do not use towel bars as grab bars. Use non-skid mats or decals in the tub or shower. If you need to sit down in the shower, use a plastic, non-slip stool. Keep the floor dry. Clean up any water that spills on the floor as soon as it happens. Remove soap buildup in the tub or shower regularly. Attach bath mats securely with double-sided non-slip rug tape. Do not have throw rugs and other things on the floor that can make you trip. What can I do in the bedroom? Use night  lights. Make sure that you have a light by your bed that is easy to reach. Do not use any sheets or blankets that are too big for your bed. They should not hang down onto the floor. Have a firm chair that has side arms. You can use this for support while you get dressed. Do not have throw rugs and other things on the floor that can make you trip. What can I do in the kitchen? Clean up any spills right away. Avoid walking on wet floors. Keep items that you use a lot in easy-to-reach places. If you need to reach something above you, use a strong step stool that has a grab bar. Keep electrical cords out of the way. Do not use floor polish or wax that makes floors slippery. If you must use wax, use non-skid floor wax. Do not have throw rugs and other things on the floor that can make you trip. What can I do with my stairs? Do not leave any items on the stairs. Make sure that there are handrails on both sides of the stairs and use them. Fix handrails that are broken or loose. Make sure that handrails are as long as the stairways. Check any carpeting to make sure that it is firmly attached to the stairs. Fix any carpet that is loose or worn. Avoid having throw rugs at the  top or bottom of the stairs. If you do have throw rugs, attach them to the floor with carpet tape. Make sure that you have a light switch at the top of the stairs and the bottom of the stairs. If you do not have them, ask someone to add them for you. What else can I do to help prevent falls? Wear shoes that: Do not have high heels. Have rubber bottoms. Are comfortable and fit you well. Are closed at the toe. Do not wear sandals. If you use a stepladder: Make sure that it is fully opened. Do not climb a closed stepladder. Make sure that both sides of the stepladder are locked into place. Ask someone to hold it for you, if possible. Clearly mark and make sure that you can see: Any grab bars or handrails. First and last  steps. Where the edge of each step is. Use tools that help you move around (mobility aids) if they are needed. These include: Canes. Walkers. Scooters. Crutches. Turn on the lights when you go into a dark area. Replace any light bulbs as soon as they burn out. Set up your furniture so you have a clear path. Avoid moving your furniture around. If any of your floors are uneven, fix them. If there are any pets around you, be aware of where they are. Review your medicines with your doctor. Some medicines can make you feel dizzy. This can increase your chance of falling. Ask your doctor what other things that you can do to help prevent falls. This information is not intended to replace advice given to you by your health care provider. Make sure you discuss any questions you have with your health care provider. Document Released: 04/23/2009 Document Revised: 12/03/2015 Document Reviewed: 08/01/2014 Elsevier Interactive Patient Education  2017 [employer].

## 2022-02-08 NOTE — Progress Notes (Signed)
Subjective:   Laurie Mays is a 81 y.o. female who presents for Medicare Annual (Subsequent) preventive examination.  Review of Systems     Cardiac Risk Factors include: advanced age (>35men, >76 women);sedentary lifestyle;hypertension;diabetes mellitus;dyslipidemia     Objective:    There were no vitals filed for this visit. There is no height or weight on file to calculate BMI.     02/08/2022    3:22 PM 12/24/2021   11:38 AM 12/15/2021   10:56 AM 06/16/2021   10:52 AM 02/02/2021    8:59 AM 01/29/2020   10:09 AM 08/26/2019    3:02 PM  Advanced Directives  Does Patient Have a Medical Advance Directive? Yes Yes Yes Yes Yes Yes Yes  Type of Special educational needs teacher Power of [place] of [place];Living will [place]  Does patient want to make changes to medical advance directive? No - Patient declined No - Patient declined No - Patient declined No - Patient declined No - Patient declined No - Patient declined No - Patient declined  Copy of [place] in Chart? Yes - validated most recent copy scanned in chart (See row information) Yes - validated most recent copy scanned in chart (See row information)  Yes - validated most recent copy scanned in chart (See row information) Yes - validated most recent copy scanned in chart (See row information) Yes - validated most recent copy scanned in chart (See row information) Yes - validated most recent copy scanned in chart (See row information)    Current Medications (verified) Outpatient Encounter Medications as of 02/08/2022  Medication Sig   acetaminophen (TYLENOL) 325 MG tablet Take 650 mg by mouth daily.    Ascorbic Acid (VITAMIN C) 1000 MG tablet Take 1 tablet (1,000 mg total) by mouth daily.   aspirin EC 81 MG tablet Take 1 tablet (81 mg total) by mouth daily. Swallow whole.   atorvastatin  (LIPITOR) 40 MG tablet TAKE 1 TABLET BY MOUTH EVERY DAY   busPIRone (BUSPAR) 10 MG tablet TAKE 1 TABLET BY MOUTH TWICE A DAY   cholecalciferol (VITAMIN D3) 25 MCG (1000 UNIT) tablet Take 1 tablet (1,000 Units total) by mouth daily.   citalopram (CELEXA) 10 MG tablet TAKE 1 TABLET BY MOUTH EVERY DAY   diclofenac sodium (VOLTAREN) 1 % GEL Apply 4 g topically 4 (four) times daily. To both knees. May apply 2 gm to hands QID for arthritis   donepezil (ARICEPT) 10 MG tablet TAKE 1 TABLET BY MOUTH EVERYDAY AT BEDTIME   Incontinence Supply Disposable MISC XL incontinence brief to use as directed Dx: incontinence of urine   traZODone (DESYREL) 50 MG tablet TAKE 1 TABLET BY MOUTH EVERY DAY IN THE MORNING AND 1/2 OF A TABLET IN THE EVENING   cyanocobalamin 1000 MCG tablet Take 1,000 mcg by mouth daily. (Patient not taking: Reported on 02/08/2022)   No facility-administered encounter medications on file as of 02/08/2022.    Allergies (verified) Lisinopril, Losartan, Memantine, Peanut-containing drug products, and Tramadol   History: Past Medical History:  Diagnosis Date   Gallbladder disorder 2009   Per New Patient Packet    Heart disease    High blood pressure    Per New Patient Packet    High cholesterol    Past Surgical History:  Procedure Laterality Date   ABDOMINAL HYSTERECTOMY     Per New Patient Packet  CARDIAC SURGERY  2011   Open Heart Surgey, Per New Patient Packet     REPLACEMENT TOTAL KNEE Left 2010   Per New Patient Packet    Family History  Problem Relation Age of Onset   Dementia Mother    Arthritis Mother    Breast cancer Mother    Heart attack Father    Arthritis Father    Heart attack Sister    Arthritis Sister    Breast cancer Sister    Social History   Socioeconomic History   Marital status: Married    Spouse name: Not on file   Number of children: Not on file   Years of education: Not on file   Highest education level: Not on file  Occupational History    Not on file  Tobacco Use   Smoking status: Former    Types: Cigarettes    Quit date: 07/12/1987    Years since quitting: 34.6   Smokeless tobacco: Never   Tobacco comments:    Quit about 30 years ago as of 2019   Vaping Use   Vaping Use: Never used  Substance and Sexual Activity   Alcohol use: Never   Drug use: Not Currently   Sexual activity: Not on file  Other Topics Concern   Not on file  Social History Narrative   Diet: N/A      Caffeine: Coffee and Pepsi      Married, if yes what year: Yes, 1961      Do you live in a house, apartment, assisted living, condo, trailer, ect: House, one stories, 1 person       Pets: None      Current/Past profession: Geologist, engineering       Exercise: No          Living Will: Yes   DNR:   POA/HPOA:      Functional Status:   Do you have difficulty bathing or dressing yourself? No   Do you have difficulty preparing food or eating? Yes   Do you have difficulty managing your medications? Yes    Do you have difficulty managing your finances? Yes    Do you have difficulty affording your medications? No   Social Determinants of Health   Financial Resource Strain: Low Risk  (01/19/2018)   Overall Financial Resource Strain (CARDIA)    Difficulty of Paying Living Expenses: Not hard at all  Food Insecurity: No Food Insecurity (01/19/2018)   Hunger Vital Sign    Worried About Running Out of Food in the Last Year: Never true    Ran Out of Food in the Last Year: Never true  Transportation Needs: No Transportation Needs (01/19/2018)   PRAPARE - Administrator, Civil Service (Medical): No    Lack of Transportation (Non-Medical): No  Physical Activity: Inactive (01/19/2018)   Exercise Vital Sign    Days of Exercise per Week: 0 days    Minutes of Exercise per Session: 0 min  Stress: No Stress Concern Present (01/19/2018)   [employer] of Occupational Health - Occupational Stress Questionnaire    Feeling of Stress : Not at all   Social Connections: Moderately Integrated (01/19/2018)   Social Connection and Isolation Panel [NHANES]    Frequency of Communication with Friends and Family: More than three times a week    Frequency of Social Gatherings with Friends and Family: More than three times a week    Attends Religious Services: More than 4 times per year  Active Member of Clubs or Organizations: No    Attends Archivist Meetings: Never    Marital Status: Married    Tobacco Counseling Counseling given: Not Answered Tobacco comments: Quit about 30 years ago as of 2019    Clinical Intake:  Pre-visit preparation completed: Yes  Pain : No/denies pain     BMI - recorded: 37 Nutritional Status: BMI > 30  Obese Diabetes: No  How often do you need to have someone help you when you read instructions, pamphlets, or other written materials from your doctor or pharmacy?: 5 - Always  Diabetic?no         Activities of Daily Living    02/08/2022    3:36 PM  In your present state of health, do you have any difficulty performing the following activities:  Hearing? 1  Vision? 1  Difficulty concentrating or making decisions? 1  Walking or climbing stairs? 1  Dressing or bathing? 1  Comment family helps  Doing errands, shopping? 1  Preparing Food and eating ? Y  Using the Toilet? N  In the past six months, have you accidently leaked urine? Y  Do you have problems with loss of bowel control? N  Managing your Medications? Y  Managing your Finances? Y  Housekeeping or managing your Housekeeping? Y    Patient Care Team: Lauree Chandler, NP as PCP - General (Geriatric Medicine) Buford Dresser, MD as PCP - Cardiology (Cardiology)  Indicate any recent Medical Services you may have received from other than [facility] providers in the past year (date may be approximate).     Assessment:   This is a routine wellness examination for Laurie Mays.  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 1 0 0 1 0  Comment Tripped      Number falls in past yr: 0 0 0 0 0  Injury with Fall? 0 0 0 0 0  Risk for fall due to : No Fall Risks No Fall Risks No Fall Risks History of fall(s)   Follow up Falls evaluation completed Falls evaluation completed Falls evaluation completed Falls evaluation completed     FALL RISK PREVENTION PERTAINING TO THE HOME:  Any stairs in or around the home? Yes  If so, are there any without handrails? No  Home free of loose throw rugs in walkways, pet beds, electrical cords, etc? Yes  Adequate lighting in your home to reduce risk of falls? Yes   ASSISTIVE DEVICES UTILIZED TO PREVENT FALLS:  Life alert? No  Use of a cane, walker or w/c? Yes  Grab bars in the bathroom? Yes  Shower chair or bench in shower? Yes  Elevated toilet  seat or a handicapped toilet? Yes   TIMED UP AND GO:  Was the test performed? No .    Cognitive Function:    01/29/2020   10:11 AM 01/19/2018    9:40 AM  MMSE - Mini Mental State Exam  Orientation to time 1 1  Orientation to Place 4 3  Registration 3 3  Attention/ Calculation 2 5  Recall 0 0  Language- name 2 objects 2 2  Language- repeat 1 1  Language- follow 3 step command 3 3  Language- read & follow direction 1 1  Write a sentence 1 1  Copy design 0 0  Total score 18 20        02/08/2022    3:23 PM 02/02/2021     9:01 AM 01/28/2019    8:41 AM  6CIT Screen  What Year? 0 points 0 points 0 points  What month? 0 points 3 points 0 points  What time? 0 points 3 points 0 points  Count back from 20 4 points 2 points 2 points  Months in reverse 4 points 4 points 0 points  Repeat phrase 10 points 10 points 4 points  Total Score 18 points 22 points 6 points    Immunizations Immunization History  Administered Date(s) Administered   Fluad Quad(high Dose 65+) 04/24/2019, 04/10/2020, 06/16/2021   Influenza, High Dose Seasonal PF 07/11/1968, 05/25/2017, 05/23/2018   PFIZER(Purple Top)SARS-COV-2 Vaccination 09/02/2019, 09/23/2019, 06/25/2020   Pneumococcal Conjugate-13 01/19/2018   Pneumococcal Polysaccharide-23 07/15/2020   Tdap 07/11/1988    TDAP status: Due, Education has been provided regarding the importance of this vaccine. Advised may receive this vaccine at local pharmacy or Health Dept. Aware to provide a copy of the vaccination record if obtained from local pharmacy or Health Dept. Verbalized acceptance and understanding.  Flu Vaccine status: Up to date  Pneumococcal vaccine status: Up to date  Covid-19 vaccine status: Information provided on how to obtain vaccines.   Qualifies for Shingles Vaccine? Yes   Zostavax completed No   Shingrix Completed?: No.    Education has been provided regarding the importance of this vaccine. Patient has been advised to call insurance company to determine out of pocket expense if they have not yet received this vaccine. Advised may also receive vaccine at local pharmacy or Health Dept. Verbalized acceptance and understanding.  Screening Tests Health Maintenance  Topic Date Due   Zoster Vaccines- Shingrix (1 of 2) Never done   TETANUS/TDAP  07/11/1998   COVID-19 Vaccine (4 - Pfizer series) 08/20/2020   INFLUENZA VACCINE  02/08/2022   Pneumonia Vaccine 78+ Years old  Completed   HPV VACCINES  Aged Out   DEXA SCAN  Discontinued    Health  Maintenance  Health Maintenance Due  Topic Date Due   Zoster Vaccines- Shingrix (1 of 2) Never done   TETANUS/TDAP  07/11/1998   COVID-19 Vaccine (4 - Pfizer series) 08/20/2020   INFLUENZA VACCINE  02/08/2022    Colorectal cancer screening: No longer required.   Mammogram status: No longer required due to age.  Declines bone density  Lung Cancer Screening: (Low Dose CT Chest recommended if Age 25-80 years, 30 pack-year currently smoking OR have quit w/in 15years.) does not qualify.   Lung Cancer Screening Referral: na  Additional Screening:  Hepatitis C Screening: does not qualify  Vision Screening: Recommended annual ophthalmology exams for early detection of glaucoma and other disorders of the eye. Is the patient up to date with their annual eye exam?  No  Who is the provider or what is the name of the office in which the patient attends annual eye exams? [facility] If pt is not established with a provider, would they like to be referred to a provider to establish care? No .   Dental Screening: Recommended annual dental exams for proper oral hygiene  Community Resource Referral / Chronic Care Management: CRR required this visit?  No   CCM required this visit?  No      Plan:     I have personally reviewed and noted the following in the patient's chart:   Medical and social history Use of alcohol, tobacco or illicit drugs  Current medications and supplements including opioid prescriptions.  Functional ability and status Nutritional status Physical activity Advanced directives List of other physicians Hospitalizations, surgeries, and ER visits in previous 12 months Vitals Screenings to include cognitive, depression, and falls Referrals and appointments  In addition, I have reviewed and discussed with patient certain preventive protocols, quality metrics, and best practice recommendations. A written personalized care plan for preventive services as well as  general preventive health recommendations were provided to patient.     Lauree Chandler, NP   02/08/2022    Virtual Visit via Telephone Note  I connected with patient 02/08/22 at  3:20 PM EDT by telephone and verified that I am speaking with the correct person using two identifiers.  Location: Patient: home Provider: [facility]   I discussed the limitations, risks, security and privacy concerns of performing an evaluation and management service by telephone and the availability of in person appointments. I also discussed with the patient that there may be a patient responsible charge related to this service. The patient expressed understanding and agreed to proceed.   I discussed the assessment and treatment plan with the patient. The patient was provided an opportunity to ask questions and all were answered. The patient agreed with the plan and demonstrated an understanding of the instructions.   The patient was advised to call back or seek an in-person evaluation if the symptoms worsen or if the condition fails to improve as anticipated.  I provided 15 minutes of non-face-to-face time during this encounter.  Carlos American. Harle Battiest [employer] printed and mailed

## 2022-02-08 NOTE — Progress Notes (Signed)
   This service is provided via telemedicine  No vital signs collected/recorded due to the encounter was a telemedicine visit.   Location of patient (ex: home, work):  Home  Patient consents to a telephone visit: Yes, see telephone visit dated 12/24/21  Location of the provider (ex: office, home):  [employer], [place] Location   Name of any referring provider:  N/A  Names of all persons participating in the telemedicine service and their role in the encounter:  S.Chrae B/CMA, Abbey Chatters, NP, [name] (daughter), and Patient   Time spent on call:  16 min with medical assistant

## 2022-02-10 ENCOUNTER — Encounter: Payer: Self-pay | Admitting: Podiatry

## 2022-02-10 ENCOUNTER — Ambulatory Visit: Payer: Medicare PPO | Admitting: Podiatry

## 2022-02-10 DIAGNOSIS — B351 Tinea unguium: Secondary | ICD-10-CM | POA: Diagnosis not present

## 2022-02-10 NOTE — Progress Notes (Signed)
  Subjective:  Patient ID: Laurie Mays, female    DOB: Jul 25, 1940,  MRN: [id number]  AKANSHA WYCHE presents to clinic today for painful elongated mycotic toenails 1-5 bilaterally which are tender when wearing enclosed shoe gear. Pain is relieved with periodic professional debridement.  Patient has h/o dementia. She is accompanied by her husband on today's visit. Husband states he files his wife's calluses.  New problem(s): None.   PCP is [name], NP , and last visit was  June 16, 2021  Allergies  Allergen Reactions   Lisinopril Other (See Comments)   Losartan Other (See Comments)   Memantine Other (See Comments)    combative   Peanut-Containing Drug Products    Tramadol Hives    Review of Systems: Negative except as noted in the HPI.  Objective: No changes noted in today's physical examination. Constitutional GEANINE Mays is a pleasant 81 y.o. African American female, obese in NAD. AAO x 3.   Vascular CFT <3 seconds b/l LE. Palpable DP pulse(s) b/l LE. Palpable PT pulse(s) b/l LE. Pedal hair sparse. No pain with calf compression b/l. Trace edema noted BLE. Evidence of chronic venous insufficiency b/l LE.  Neurologic Normal speech. Oriented to person, place, and time. Protective sensation intact 5/5 intact bilaterally with 10g monofilament b/l. Vibratory sensation intact b/l.  Dermatologic Pedal skin is warm and supple b/l LE. No open wounds b/l LE. No interdigital macerations noted b/l LE. Toenails 1-5 b/l elongated, discolored, dystrophic, thickened, crumbly with subungual debris and tenderness to dorsal palpation. Porokeratotic lesion(s) submet head 5 b/l. No erythema, no edema, no drainage, no fluctuance.  Orthopedic: Muscle strength 5/5 to all lower extremity muscle groups bilaterally. No pain, crepitus or joint limitation noted with ROM bilateral LE. HAV with bunion deformity noted b/l LE.   Radiographs: None Assessment/Plan: 1. Onychomycosis      -Patient's family member present. All questions/concerns addressed on today's visit. -Examined patient. -No new findings. No new orders. -Medicare ABN on file for refusal of paring of corn(s)/callus(es)/porokeratos(es). Copy in patient chart. -Patient to continue soft, supportive shoe gear daily. -Toenails 1-5 b/l were debrided in length and girth with sterile nail nippers and dremel without iatrogenic bleeding.  -Patient/POA to call should there be question/concern in the interim.   Return in about 3 months (around 05/13/2022).  Laurie Mays, DPM

## 2022-03-25 ENCOUNTER — Ambulatory Visit (INDEPENDENT_AMBULATORY_CARE_PROVIDER_SITE_OTHER): Payer: Medicare PPO | Admitting: Nurse Practitioner

## 2022-03-25 ENCOUNTER — Encounter: Payer: Self-pay | Admitting: Nurse Practitioner

## 2022-03-25 VITALS — BP 124/72 | HR 56 | Temp 97.3°F | Ht 64.0 in | Wt 224.0 lb

## 2022-03-25 DIAGNOSIS — R410 Disorientation, unspecified: Secondary | ICD-10-CM | POA: Diagnosis not present

## 2022-03-25 DIAGNOSIS — Z23 Encounter for immunization: Secondary | ICD-10-CM

## 2022-03-25 DIAGNOSIS — F0390 Unspecified dementia without behavioral disturbance: Secondary | ICD-10-CM

## 2022-03-25 LAB — POCT URINALYSIS DIPSTICK
Bilirubin, UA: NEGATIVE
Blood, UA: NEGATIVE
Glucose, UA: NEGATIVE
Ketones, UA: NEGATIVE
Leukocytes, UA: NEGATIVE
Nitrite, UA: NEGATIVE
Odor: NORMAL
Protein, UA: NEGATIVE
Spec Grav, UA: 1.01 (ref 1.010–1.025)
Urobilinogen, UA: 0.2 E.U./dL
pH, UA: 6.5 (ref 5.0–8.0)

## 2022-03-25 NOTE — Addendum Note (Signed)
Addended by: Maurice Small on: 03/25/2022 05:50 PM   Modules accepted: Orders

## 2022-03-25 NOTE — Progress Notes (Signed)
Careteam: Patient Care Team: [name], NP as PCP - General (Geriatric Medicine) [name], MD as PCP - Cardiology (Cardiology)  PLACE OF SERVICE:  [facility]  Advanced Directive information    Allergies  Allergen Reactions   Lisinopril Other (See Comments)   Losartan Other (See Comments)   Memantine Other (See Comments)    combative   Peanut-Containing Drug Products    Tramadol Hives    Chief Complaint  Patient presents with   Acute Visit    Increased confusion      HPI: Patient is a 81 y.o. female for follow up  Husband reports she had been more confused but he thinks he gave her nighttime medication in the morning and morning meds at night. A few nights ago she stayed up all night and kept him up too. Since she has slept. She continues to have confusion but baseline.  She denies any concerns. No cough, congestion, dysuria, wounds.   Review of Systems:  Review of Systems  Constitutional:  Negative for chills, fever and weight loss.  HENT:  Negative for tinnitus.   Respiratory:  Negative for cough, sputum production and shortness of breath.   Cardiovascular:  Negative for chest pain, palpitations and leg swelling.  Gastrointestinal:  Negative for abdominal pain, constipation, diarrhea and heartburn.  Genitourinary:  Negative for dysuria, frequency and urgency.  Musculoskeletal:  Negative for back pain, falls, joint pain and myalgias.  Skin: Negative.   Neurological:  Negative for dizziness and headaches.  Psychiatric/Behavioral:  Positive for memory loss. Negative for depression. The patient does not have insomnia.     Past Medical History:  Diagnosis Date   Gallbladder disorder 2009   Per New Patient Packet    Heart disease    High blood pressure    Per New Patient Packet    High cholesterol    Past Surgical History:  Procedure Laterality Date   ABDOMINAL HYSTERECTOMY     Per New Patient Packet    CARDIAC SURGERY  2011   Open  Heart Surgey, Per New Patient Packet     REPLACEMENT TOTAL KNEE Left 2010   Per New Patient Packet    Social History:   reports that she quit smoking about 34 years ago. Her smoking use included cigarettes. She has never used smokeless tobacco. She reports that she does not currently use drugs. She reports that she does not drink alcohol.  Family History  Problem Relation Age of Onset   Dementia Mother    Arthritis Mother    Breast cancer Mother    Heart attack Father    Arthritis Father    Heart attack Sister    Arthritis Sister    Breast cancer Sister     Medications: Patient's Medications  New Prescriptions   No medications on file  Previous Medications   ACETAMINOPHEN (TYLENOL) 325 MG TABLET    Take 650 mg by mouth daily.    ASCORBIC ACID (VITAMIN C) 1000 MG TABLET    Take 1 tablet (1,000 mg total) by mouth daily.   ASPIRIN EC 81 MG TABLET    Take 1 tablet (81 mg total) by mouth daily. Swallow whole.   ATORVASTATIN (LIPITOR) 40 MG TABLET    TAKE 1 TABLET BY MOUTH EVERY DAY   BUSPIRONE (BUSPAR) 10 MG TABLET    TAKE 1 TABLET BY MOUTH TWICE A DAY   CHOLECALCIFEROL (VITAMIN D3) 25 MCG (1000 UNIT) TABLET    Take 1 tablet (1,000  Units total) by mouth daily.   CITALOPRAM (CELEXA) 10 MG TABLET    TAKE 1 TABLET BY MOUTH EVERY DAY   CYANOCOBALAMIN 1000 MCG TABLET    Take 1,000 mcg by mouth daily.   DICLOFENAC SODIUM (VOLTAREN) 1 % GEL    Apply 4 g topically 4 (four) times daily. To both knees. May apply 2 gm to hands QID for arthritis   DONEPEZIL (ARICEPT) 10 MG TABLET    TAKE 1 TABLET BY MOUTH EVERYDAY AT BEDTIME   INCONTINENCE SUPPLY DISPOSABLE MISC    XL incontinence brief to use as directed Dx: incontinence of urine   TRAZODONE (DESYREL) 50 MG TABLET    TAKE 1 TABLET BY MOUTH EVERY DAY IN THE MORNING AND 1/2 OF A TABLET IN THE EVENING  Modified Medications   No medications on file  Discontinued Medications   No medications on file    Physical Exam:  Vitals:   03/25/22 1537   BP: 124/72  Pulse: (!) 56  Temp: (!) 97.3 F (36.3 C)  SpO2: 96%  Weight: 224 lb (101.6 kg)  Height: 5\' 4"  (1.626 m)   Body mass index is 38.45 kg/m. Wt Readings from Last 3 Encounters:  03/25/22 224 lb (101.6 kg)  12/15/21 219 lb (99.3 kg)  06/16/21 216 lb 3.2 oz (98.1 kg)    Physical Exam Constitutional:      General: She is not in acute distress.    Appearance: She is well-developed. She is not diaphoretic.  HENT:     Head: Normocephalic and atraumatic.     Mouth/Throat:     Pharynx: No oropharyngeal exudate.  Eyes:     Conjunctiva/sclera: Conjunctivae normal.     Pupils: Pupils are equal, round, and reactive to light.  Cardiovascular:     Rate and Rhythm: Normal rate and regular rhythm.     Heart sounds: Normal heart sounds.  Pulmonary:     Effort: Pulmonary effort is normal.     Breath sounds: Normal breath sounds.  Abdominal:     General: Bowel sounds are normal.     Palpations: Abdomen is soft.  Musculoskeletal:     Cervical back: Normal range of motion and neck supple.     Right lower leg: No edema.     Left lower leg: No edema.  Skin:    General: Skin is warm and dry.  Neurological:     Mental Status: She is alert. Mental status is at baseline.  Psychiatric:        Mood and Affect: Mood normal.     Labs reviewed: Basic Metabolic Panel: Recent Labs    12/15/21 1106  NA 139  K 4.0  CL 105  CO2 26  GLUCOSE 87  BUN 7  CREATININE 0.96*  CALCIUM 9.6   Liver Function Tests: Recent Labs    12/15/21 1106  AST 16  ALT 11  BILITOT 0.5  PROT 7.2   No results for input(s): "LIPASE", "AMYLASE" in the last 8760 hours. No results for input(s): "AMMONIA" in the last 8760 hours. CBC: Recent Labs    12/15/21 1106  WBC 4.2  NEUTROABS 2,037  HGB 13.7  HCT 41.7  MCV 86.5  PLT 195   Lipid Panel: Recent Labs    12/15/21 1106  CHOL 157  HDL 61  LDLCALC 82  TRIG 64  CHOLHDL 2.6   TSH: No results for input(s): "TSH" in the last 8760  hours. A1C: No results found for: "HGBA1C"   Assessment/Plan 1.  Dementia without behavioral disturbance (HCC) -Stable, no acute changes in cognitive or functional status, continue supportive care at this time.  - POC Urinalysis Dipstick  2. Acute confusion Noted but has resolved at this time. UA was negative and VS stable. No signs of acute infections. Will have family continue to montior.  - POC Urinalysis Dipstick  Wendee Hata K. Biagio Borg  [facility] [phone]

## 2022-04-06 ENCOUNTER — Telehealth: Payer: Self-pay

## 2022-04-06 NOTE — Telephone Encounter (Signed)
Recommend Melatonin 3 or 6 mg tablet daily at bedtime.

## 2022-04-06 NOTE — Telephone Encounter (Signed)
Patient's daughter called stating that mother is not sleeping well. She stated that patient has been awake since 1:00 am today. She states that patient's husband took her riding in the car and she didn't want to get out of the car and she is just not relaxing. They were in a few weeks ago because of UTI. She would like to know what should she do. Please advise.  Message routed to Marlowe Sax, NP

## 2022-04-07 ENCOUNTER — Encounter: Payer: Self-pay | Admitting: Family

## 2022-04-07 ENCOUNTER — Ambulatory Visit (INDEPENDENT_AMBULATORY_CARE_PROVIDER_SITE_OTHER): Payer: Medicare PPO | Admitting: Family

## 2022-04-07 VITALS — BP 133/76 | HR 74 | Temp 97.8°F | Ht 64.0 in | Wt 221.0 lb

## 2022-04-07 DIAGNOSIS — F411 Generalized anxiety disorder: Secondary | ICD-10-CM | POA: Diagnosis not present

## 2022-04-07 DIAGNOSIS — R410 Disorientation, unspecified: Secondary | ICD-10-CM

## 2022-04-07 LAB — POCT URINALYSIS DIPSTICK
Glucose, UA: NEGATIVE
Nitrite, UA: NEGATIVE
Protein, UA: NEGATIVE
Spec Grav, UA: 1.02 (ref 1.010–1.025)
Urobilinogen, UA: NEGATIVE E.U./dL — AB
pH, UA: 6 (ref 5.0–8.0)

## 2022-04-07 MED ORDER — BUSPIRONE HCL 10 MG PO TABS: 10.0000 mg | ORAL_TABLET | Freq: Three times a day (TID) | ORAL | 1 refills | Status: DC

## 2022-04-07 NOTE — Progress Notes (Signed)
Provider: Zeven Kocak FNP-C  [name], NP  Patient Care Team: [name], NP as PCP - General (Geriatric Medicine) [name], MD as PCP - Cardiology (Cardiology)  Extended Emergency Contact Information Primary Emergency Contact: Elijio Miles Address: [address]          [place], [place] [address] [place] of [place] Home Phone: [phone] Relation: Spouse Secondary Emergency Contact: Earlean Polka Home Phone: [phone] Work Phone: [phone] Mobile Phone: [phone] Relation: Daughter  Code Status:  Full Code  Goals of care: Advanced Directive information    02/08/2022    3:22 PM  Advanced Directives  Does Patient Have a Medical Advance Directive? Yes  Type of Advance Directive Healthcare Power of Attorney  Does patient want to make changes to medical advance directive? No - Patient declined  Copy of Healthcare Power of Attorney in Chart? Yes - validated most recent copy scanned in chart (See row information)     Chief Complaint  Patient presents with   Acute Visit    Confusion and not sleeping    HPI:  Pt is a 81 y.o. female seen today for an acute visit for evaluation of increased confusion and inability to sleep x several days.she is here with Husband who provides HPI information.States patient has been more forgetful  Ask same question over and over.Told husband not to leave " her mother alone in the house".Her mother has been dead for several years.  Slept today at 11 am since she was up all night. Has lost 3 lbs since last seen on 03/25/2022.Has not been eating well takes 3-4 bites. Patient states feels anxious. Denies any cough.     Past Medical History:  Diagnosis Date   Gallbladder disorder 2009   Per New Patient Packet    Heart disease    High blood pressure    Per New Patient Packet    High cholesterol    Past Surgical History:  Procedure Laterality Date   ABDOMINAL HYSTERECTOMY     Per New Patient Packet     CARDIAC SURGERY  2011   Open Heart Surgey, Per New Patient Packet     REPLACEMENT TOTAL KNEE Left 2010   Per New Patient Packet     Allergies  Allergen Reactions   Lisinopril Other (See Comments)   Losartan Other (See Comments)   Memantine Other (See Comments)    combative   Peanut-Containing Drug Products    Tramadol Hives    Outpatient Encounter Medications as of 04/07/2022  Medication Sig   acetaminophen (TYLENOL) 325 MG tablet Take 650 mg by mouth daily.    Ascorbic Acid (VITAMIN C) 1000 MG tablet Take 1 tablet (1,000 mg total) by mouth daily.   aspirin EC 81 MG tablet Take 1 tablet (81 mg total) by mouth daily. Swallow whole.   atorvastatin (LIPITOR) 40 MG tablet TAKE 1 TABLET BY MOUTH EVERY DAY   busPIRone (BUSPAR) 10 MG tablet TAKE 1 TABLET BY MOUTH TWICE A DAY   cholecalciferol (VITAMIN D3) 25 MCG (1000 UNIT) tablet Take 1 tablet (1,000 Units total) by mouth daily.   citalopram (CELEXA) 10 MG tablet TAKE 1 TABLET BY MOUTH EVERY DAY   cyanocobalamin 1000 MCG tablet Take 1,000 mcg by mouth daily.   diclofenac sodium (VOLTAREN) 1 % GEL Apply 4 g topically 4 (four) times daily. To both knees. May apply 2 gm to hands QID for arthritis   donepezil (ARICEPT) 10 MG tablet TAKE 1 TABLET BY MOUTH EVERYDAY AT  BEDTIME   Incontinence Supply Disposable MISC XL incontinence brief to use as directed Dx: incontinence of urine   traZODone (DESYREL) 50 MG tablet TAKE 1 TABLET BY MOUTH EVERY DAY IN THE MORNING AND 1/2 OF A TABLET IN THE EVENING   No facility-administered encounter medications on file as of 04/07/2022.    Review of Systems  Constitutional:  Negative for appetite change, chills, fatigue, fever and unexpected weight change.  HENT:  Positive for hearing loss. Negative for congestion, dental problem, ear discharge, ear pain, facial swelling, nosebleeds, postnasal drip, rhinorrhea, sinus pressure, sinus pain, sneezing, sore throat, tinnitus and trouble swallowing.   Eyes:   Negative for pain, discharge, redness, itching and visual disturbance.  Respiratory:  Negative for cough, chest tightness, shortness of breath and wheezing.   Cardiovascular:  Negative for chest pain, palpitations and leg swelling.  Gastrointestinal:  Negative for abdominal distention, abdominal pain, blood in stool, constipation, diarrhea, nausea and vomiting.  Endocrine: Negative for cold intolerance, heat intolerance, polydipsia, polyphagia and polyuria.  Genitourinary:  Negative for difficulty urinating, dysuria, flank pain, frequency and urgency.  Musculoskeletal:  Positive for arthralgias and gait problem. Negative for back pain, joint swelling, myalgias, neck pain and neck stiffness.  Skin:  Negative for color change, pallor, rash and wound.  Neurological:  Negative for dizziness, syncope, speech difficulty, weakness, light-headedness, numbness and headaches.  Hematological:  Does not bruise/bleed easily.  Psychiatric/Behavioral:  Positive for confusion and sleep disturbance. Negative for agitation, behavioral problems, hallucinations, self-injury and suicidal ideas. The patient is not nervous/anxious.     Immunization History  Administered Date(s) Administered   Fluad Quad(high Dose 65+) 04/24/2019, 04/10/2020, 06/16/2021, 03/25/2022   Influenza, High Dose Seasonal PF 07/11/1968, 05/25/2017, 05/23/2018   PFIZER(Purple Top)SARS-COV-2 Vaccination 09/02/2019, 09/23/2019, 06/25/2020   Pneumococcal Conjugate-13 01/19/2018   Pneumococcal Polysaccharide-23 07/15/2020   Tdap 07/11/1988   Pertinent  Health Maintenance Due  Topic Date Due   INFLUENZA VACCINE  Completed   DEXA SCAN  Discontinued      02/02/2021    9:00 AM 06/16/2021   10:51 AM 12/15/2021   10:57 AM 02/08/2022    3:20 PM 04/07/2022    1:10 PM  Fall Risk  Falls in the past year? 1 0 0 1 0  Was there an injury with Fall? 0 0 0 0 0  Fall Risk Category Calculator 1 0 0 1 0  Fall Risk Category Low Low Low Low Low  Patient Fall  Risk Level Low fall risk Low fall risk Low fall risk Low fall risk Low fall risk  Patient at Risk for Falls Due to History of fall(s) No Fall Risks No Fall Risks No Fall Risks No Fall Risks  Fall risk Follow up Falls evaluation completed Falls evaluation completed Falls evaluation completed Falls evaluation completed Falls evaluation completed   Functional Status Survey:    Vitals:   04/07/22 1258  BP: 133/76  Pulse: 74  Temp: 97.8 F (36.6 C)  SpO2: 96%  Weight: 221 lb (100.2 kg)  Height: 5\' 4"  (1.626 m)   Body mass index is 37.93 kg/m. Physical Exam Vitals reviewed.  Constitutional:      General: She is not in acute distress.    Appearance: Normal appearance. She is obese. She is not ill-appearing or diaphoretic.  HENT:     Head: Normocephalic.     Right Ear: Tympanic membrane, ear canal and external ear normal. There is no impacted cerumen.     Left Ear: Tympanic membrane, ear canal  and external ear normal. There is no impacted cerumen.     Nose: Nose normal. No congestion or rhinorrhea.     Mouth/Throat:     Mouth: Mucous membranes are moist.     Pharynx: Oropharynx is clear. No oropharyngeal exudate or posterior oropharyngeal erythema.  Eyes:     General: No scleral icterus.       Right eye: No discharge.        Left eye: No discharge.     Extraocular Movements: Extraocular movements intact.     Conjunctiva/sclera: Conjunctivae normal.     Pupils: Pupils are equal, round, and reactive to light.  Neck:     Vascular: No carotid bruit.  Cardiovascular:     Rate and Rhythm: Normal rate and regular rhythm.     Pulses: Normal pulses.     Heart sounds: Normal heart sounds. No murmur heard.    No friction rub. No gallop.  Pulmonary:     Effort: Pulmonary effort is normal. No respiratory distress.     Breath sounds: Normal breath sounds. No wheezing, rhonchi or rales.  Chest:     Chest wall: No tenderness.  Abdominal:     General: Bowel sounds are normal. There is no  distension.     Palpations: Abdomen is soft. There is no mass.     Tenderness: There is no abdominal tenderness. There is no right CVA tenderness, left CVA tenderness, guarding or rebound.  Musculoskeletal:        General: No swelling or tenderness.     Cervical back: Normal range of motion. No rigidity or tenderness.     Right lower leg: No edema.     Left lower leg: No edema.     Comments: Unsteady gait   Lymphadenopathy:     Cervical: No cervical adenopathy.  Skin:    General: Skin is warm and dry.     Coloration: Skin is not pale.     Findings: No bruising, erythema, lesion or rash.  Neurological:     Mental Status: She is alert. Mental status is at baseline.     Motor: No weakness.     Gait: Gait abnormal.  Psychiatric:        Mood and Affect: Mood normal.        Speech: Speech normal.        Behavior: Behavior normal.        Thought Content: Thought content normal.        Cognition and Memory: Cognition is impaired. Memory is impaired.        Judgment: Judgment is inappropriate.     Labs reviewed: Recent Labs    12/15/21 1106  NA 139  K 4.0  CL 105  CO2 26  GLUCOSE 87  BUN 7  CREATININE 0.96*  CALCIUM 9.6   Recent Labs    12/15/21 1106  AST 16  ALT 11  BILITOT 0.5  PROT 7.2   Recent Labs    12/15/21 1106  WBC 4.2  NEUTROABS 2,037  HGB 13.7  HCT 41.7  MCV 86.5  PLT 195   Lab Results  Component Value Date   TSH 0.92 11/11/2020   No results found for: "HGBA1C" Lab Results  Component Value Date   CHOL 157 12/15/2021   HDL 61 12/15/2021   LDLCALC 82 12/15/2021   TRIG 64 12/15/2021   CHOLHDL 2.6 12/15/2021    Significant Diagnostic Results in last 30 days:  No results found.  Assessment/Plan  1.  Generalized anxiety disorder Patient reports increased anxiety concurrent with patient's daughter too. Will increase buspirone 10 mg from twice daily to 3 times daily Continue to monitor.  Consider increasing Celexa to 20 mg if symptoms  persist - busPIRone (BUSPAR) 10 MG tablet; Take 1 tablet (10 mg total) by mouth 3 (three) times daily.  Dispense: 180 tablet; Refill: 1  2. Confusion Afebrile Suspect due to her dementia but will rule out other acute abnormalities.  Making jokes during the visit - POC Urinalysis Dipstick - Urine Culture  Family/ staff Communication: Reviewed plan of care with patient and husband verbalized understanding  Labs/tests ordered:  - POC Urinalysis Dipstick - Urine Culture  Next Appointment: Return if symptoms worsen or fail to improve.   Sandrea Hughs, NP

## 2022-04-07 NOTE — Patient Instructions (Signed)
-   Increase Buspar 10 mg tablet to one by mouth three times daily.

## 2022-04-07 NOTE — Telephone Encounter (Signed)
Patients daughter called again this morning, Appointment scheduled for patient to see Dinah at 10:40 today

## 2022-04-08 LAB — URINE CULTURE
MICRO NUMBER:: 13982077
SPECIMEN QUALITY:: ADEQUATE

## 2022-04-14 ENCOUNTER — Other Ambulatory Visit: Payer: Self-pay

## 2022-04-14 DIAGNOSIS — F0390 Unspecified dementia without behavioral disturbance: Secondary | ICD-10-CM | POA: Diagnosis not present

## 2022-04-14 DIAGNOSIS — R410 Disorientation, unspecified: Secondary | ICD-10-CM | POA: Diagnosis not present

## 2022-04-14 DIAGNOSIS — Z87891 Personal history of nicotine dependence: Secondary | ICD-10-CM | POA: Diagnosis not present

## 2022-04-14 DIAGNOSIS — Z79899 Other long term (current) drug therapy: Secondary | ICD-10-CM | POA: Diagnosis not present

## 2022-04-14 DIAGNOSIS — Z91010 Allergy to peanuts: Secondary | ICD-10-CM | POA: Diagnosis not present

## 2022-04-14 DIAGNOSIS — H2510 Age-related nuclear cataract, unspecified eye: Secondary | ICD-10-CM | POA: Diagnosis not present

## 2022-04-14 LAB — BASIC METABOLIC PANEL
BUN: 9 (ref 4–21)
CO2: 26 — AB (ref 13–22)
Chloride: 107 (ref 99–108)
Creatinine: 1 (ref 0.5–1.1)
Glucose: 105
Potassium: 3.6 mEq/L (ref 3.5–5.1)
Sodium: 140 (ref 137–147)

## 2022-04-14 LAB — COMPREHENSIVE METABOLIC PANEL WITH GFR
Albumin: 4.1 (ref 3.5–5.0)
Calcium: 9.5 (ref 8.7–10.7)

## 2022-04-14 LAB — HEPATIC FUNCTION PANEL
ALT: 23 U/L (ref 7–35)
AST: 30 (ref 13–35)
Alkaline Phosphatase: 95 (ref 25–125)
Bilirubin, Total: 0.8

## 2022-04-14 LAB — CBC AND DIFFERENTIAL
HCT: 40 (ref 36–46)
Hemoglobin: 13.2 (ref 12.0–16.0)
Neutrophils Absolute: 3.2
Platelets: 193 K/uL (ref 150–400)
WBC: 6.2

## 2022-04-14 LAB — CBC: RBC: 4.79 (ref 3.87–5.11)

## 2022-04-14 NOTE — Progress Notes (Unsigned)
rion

## 2022-04-15 ENCOUNTER — Telehealth: Payer: Self-pay

## 2022-04-15 DIAGNOSIS — F03918 Unspecified dementia, unspecified severity, with other behavioral disturbance: Secondary | ICD-10-CM

## 2022-04-15 NOTE — Telephone Encounter (Signed)
Referral has been placed. If you do not hear back about referral in a week please call our office back and ask for an update on referral.

## 2022-04-15 NOTE — Telephone Encounter (Signed)
Patient's daughter [name]) was advised and verbalized understanding. She stated that she going to send Dani Gobble a message via [place].

## 2022-04-15 NOTE — Telephone Encounter (Signed)
Patient daughter called to inform you that her mom has had a horrible day yesterday. She states she was very aggressive, punching, fighting, and even opening up the car doors when she shouldn't have been. Patient was taken to the ED and received blood work and urine sample (no UTI was found). ED is suggesting referral to Neurology.Daughter would like to try out Dr. Sarina Ill in [place].

## 2022-04-18 ENCOUNTER — Telehealth: Payer: Self-pay | Admitting: Nurse Practitioner

## 2022-04-18 ENCOUNTER — Telehealth: Payer: Self-pay

## 2022-04-18 NOTE — Telephone Encounter (Signed)
Patient called to talk to a provider routed to [place].

## 2022-04-18 NOTE — Telephone Encounter (Signed)
Patient's daughter, Kenney Houseman called stating that patient was taking to Ed at [facility] Thursday because they thought patient may have had a UTI. She states that patient has been up ranting and raving since Thursday. The doctor at [facility] told them they may want to consider a referral to neurologist . Patient tried to get out of the house at 1:00 this morning. She has been given her midday buspirone dose,but that hasn't helped. Where do they go from here.Nothing is working.Please advise.  Message routed to Sherrie Mustache, NP

## 2022-04-18 NOTE — Telephone Encounter (Signed)
I have referred her to a neurologist, since there is ongoing issue to make a virtual visit (or can be in person if they want to wait) to discuss treatment options.

## 2022-04-18 NOTE — Telephone Encounter (Signed)
Called Laurie Mays back and informed her of response and she stated that she will call back to see about scheduling virtual appointment

## 2022-04-19 ENCOUNTER — Encounter: Payer: Self-pay | Admitting: Nurse Practitioner

## 2022-04-19 ENCOUNTER — Telehealth (INDEPENDENT_AMBULATORY_CARE_PROVIDER_SITE_OTHER): Payer: Medicare PPO | Admitting: Nurse Practitioner

## 2022-04-19 DIAGNOSIS — F331 Major depressive disorder, recurrent, moderate: Secondary | ICD-10-CM | POA: Diagnosis not present

## 2022-04-19 DIAGNOSIS — F03918 Unspecified dementia, unspecified severity, with other behavioral disturbance: Secondary | ICD-10-CM

## 2022-04-19 MED ORDER — BREXPIPRAZOLE 0.5 MG PO TABS: 0.5000 mg | ORAL_TABLET | Freq: Every day | ORAL | 1 refills | Status: DC

## 2022-04-19 NOTE — Progress Notes (Unsigned)
Careteam: Patient Care Team: [name], NP as PCP - General (Geriatric Medicine) [name], MD as PCP - Cardiology (Cardiology)  Advanced Directive information Does Patient Have a Medical Advance Directive?: Yes, Type of Advance Directive: Healthcare Power of Attorney, Does patient want to make changes to medical advance directive?: No - Patient declined  Allergies  Allergen Reactions   Lisinopril Other (See Comments)   Losartan Other (See Comments)   Memantine Other (See Comments)    combative   Peanut-Containing Drug Products    Tramadol Hives    Chief Complaint  Patient presents with   Acute Visit    Discuss behavior, disoriented, hallucinations, and sleep issues via video/telephone visit.      HPI: Patient is a 81 y.o. female via video visit.  Pt with dementia and having increase in behaviors. She will nap but has not  She was having bad hallucinations and would be very agitated and combative. Behaviors improved late yesterday afternoon.  She was having increase in behaviors since Oct 1st. She went to [facility] on Thursday they thought it was a UTI but ruled that out.  Felt like it was the disease process worsening.  For the first time in 10 days she slept all night.  She feels like trazodone is helping calm her down.   Review of Systems:  Review of Systems  Unable to perform ROS: Dementia    Past Medical History:  Diagnosis Date   Gallbladder disorder 2009   Per New Patient Packet    Heart disease    High blood pressure    Per New Patient Packet    High cholesterol    Past Surgical History:  Procedure Laterality Date   ABDOMINAL HYSTERECTOMY     Per New Patient Packet    CARDIAC SURGERY  2011   Open Heart Surgey, Per New Patient Packet     REPLACEMENT TOTAL KNEE Left 2010   Per New Patient Packet    Social History:   reports that she quit smoking about 34 years ago. Her smoking use included cigarettes. She has never  used smokeless tobacco. She reports that she does not currently use drugs. She reports that she does not drink alcohol.  Family History  Problem Relation Age of Onset   Dementia Mother    Arthritis Mother    Breast cancer Mother    Heart attack Father    Arthritis Father    Heart attack Sister    Arthritis Sister    Breast cancer Sister     Medications: Patient's Medications  New Prescriptions   No medications on file  Previous Medications   ACETAMINOPHEN (TYLENOL) 325 MG TABLET    Take 650 mg by mouth daily.    ASCORBIC ACID (VITAMIN C) 1000 MG TABLET    Take 1 tablet (1,000 mg total) by mouth daily.   ASPIRIN EC 81 MG TABLET    Take 1 tablet (81 mg total) by mouth daily. Swallow whole.   ATORVASTATIN (LIPITOR) 40 MG TABLET    TAKE 1 TABLET BY MOUTH EVERY DAY   BUSPIRONE (BUSPAR) 10 MG TABLET    Take 1 tablet (10 mg total) by mouth 3 (three) times daily.   CHOLECALCIFEROL (VITAMIN D3) 25 MCG (1000 UNIT) TABLET    Take 1 tablet (1,000 Units total) by mouth daily.   CITALOPRAM (CELEXA) 10 MG TABLET    TAKE 1 TABLET BY MOUTH EVERY DAY   CYANOCOBALAMIN 1000 MCG TABLET  Take 1,000 mcg by mouth daily.   DICLOFENAC SODIUM (VOLTAREN) 1 % GEL    Apply 4 g topically 4 (four) times daily. To both knees. May apply 2 gm to hands QID for arthritis   DONEPEZIL (ARICEPT) 10 MG TABLET    TAKE 1 TABLET BY MOUTH EVERYDAY AT BEDTIME   INCONTINENCE SUPPLY DISPOSABLE MISC    XL incontinence brief to use as directed Dx: incontinence of urine   TRAZODONE (DESYREL) 50 MG TABLET    TAKE 1 TABLET BY MOUTH EVERY DAY IN THE MORNING AND 1/2 OF A TABLET IN THE EVENING  Modified Medications   No medications on file  Discontinued Medications   No medications on file    Physical Exam:  There were no vitals filed for this visit. There is no height or weight on file to calculate BMI. Wt Readings from Last 3 Encounters:  04/07/22 221 lb (100.2 kg)  03/25/22 224 lb (101.6 kg)  12/15/21 219 lb (99.3 kg)     Physical Exam Constitutional:      Appearance: Normal appearance.  Pulmonary:     Effort: Pulmonary effort is normal.  Neurological:     Mental Status: She is alert.  Psychiatric:        Mood and Affect: Mood normal.     Labs reviewed: Basic Metabolic Panel: Recent Labs    12/15/21 1106  NA 139  K 4.0  CL 105  CO2 26  GLUCOSE 87  BUN 7  CREATININE 0.96*  CALCIUM 9.6   Liver Function Tests: Recent Labs    12/15/21 1106  AST 16  ALT 11  BILITOT 0.5  PROT 7.2   No results for input(s): "LIPASE", "AMYLASE" in the last 8760 hours. No results for input(s): "AMMONIA" in the last 8760 hours. CBC: Recent Labs    12/15/21 1106  WBC 4.2  NEUTROABS 2,037  HGB 13.7  HCT 41.7  MCV 86.5  PLT 195   Lipid Panel: Recent Labs    12/15/21 1106  CHOL 157  HDL 61  LDLCALC 82  TRIG 64  CHOLHDL 2.6   TSH: No results for input(s): "TSH" in the last 8760 hours. A1C: No results found for: "HGBA1C"   Assessment/Plan 1. Dementia with behavioral disturbance (HCC) Progression of the disease causing increase in behaviors with hallucinations, paranoia and disruption of sleep wake cycle  She lives with husband who supports her.  Unable to tolerate namanda Will start rexulti  - brexpiprazole 0.5 MG TABS; Take 1 tablet (0.5 mg total) by mouth daily.  Dispense: 30 tablet; Refill: 1  2. Moderate episode of recurrent major depressive disorder (HCC) -continues to have depressed mood, add rexulta - brexpiprazole 0.5 MG TABS; Take 1 tablet (0.5 mg total) by mouth daily.  Dispense: 30 tablet; Refill: 1   Next appt: 4 weeks for follow up  [place]. Harle Battiest  [facility] [phone]    Virtual Visit via mychart video  I connected with patient on 04/19/22 at  9:40 AM EDT by video and verified that I am speaking with the correct person using two identifiers.  Location: Patient: home Provider: [facility]    I discussed the  limitations, risks, security and privacy concerns of performing an evaluation and management service by telephone and the availability of in person appointments. I also discussed with the patient that there may be a patient responsible charge related to this service. The patient expressed understanding and agreed to proceed.  I discussed the assessment and treatment plan with the patient. The patient was provided an opportunity to ask questions and all were answered. The patient agreed with the plan and demonstrated an understanding of the instructions.   The patient was advised to call back or seek an in-person evaluation if the symptoms worsen or if the condition fails to improve as anticipated.  I provided 20 minutes of non-face-to-face time during this encounter.  Janene Harvey. [facility]ag[facility] [employer] printed and mailed

## 2022-04-19 NOTE — Progress Notes (Unsigned)
   This service is provided via telemedicine  No vital signs collected/recorded due to the encounter was a telemedicine visit.   Location of patient (ex: home, work):  Home  Patient consents to a telephone visit: Yes, see telephone visit dated 12/24/2021  Location of the provider (ex: office, home):  [place], [place] Location    Name of any referring provider:  N/A  Names of all persons participating in the telemedicine service and their role in the encounter:  S.Chrae B/CMA, Sherrie Mustache, NP, Mr.Condie, Minna Merritts (son), and Patient   Time spent on call:  9 min with medical assistant

## 2022-04-22 ENCOUNTER — Telehealth: Payer: Self-pay

## 2022-04-22 NOTE — Telephone Encounter (Signed)
Can you call pharmacy to see if they are needing a prior authorization

## 2022-04-22 NOTE — Telephone Encounter (Signed)
Patient's daughter called to see if Laurie Mays could send in another medication to replace brexpiprazole due to insurance want cover medication per pharmacy. Daughter states that patient really needs something for agitation.

## 2022-04-26 NOTE — Telephone Encounter (Signed)
Patient daughter called and asked to speak to Antonito about PA for mother. I informed her that I can help assist. I notified her that pharmacy was contacted this morning and we are waiting for PA to be faxed to [place].

## 2022-04-26 NOTE — Telephone Encounter (Signed)
I called the pharmacy and left a detailed message on the voicemail prompting them to fax [place] a prior authorization if one is needed

## 2022-04-26 NOTE — Telephone Encounter (Addendum)
Left message on voicemail for Laurie Mays to return call when available, if any additional assistance is needed

## 2022-05-05 ENCOUNTER — Telehealth: Payer: Self-pay

## 2022-05-05 NOTE — Telephone Encounter (Signed)
Yes okay to take in the evening/night. Just do not take more than once daily.

## 2022-05-05 NOTE — Telephone Encounter (Signed)
Patients daughter called to seek clarification on the time of day her mother should take brexpiprazole. The pharmacist advised to take at night, however the instructions say during the day, and Laurie Mays would like to confirm that she is doing what is best practice for her mother.  Side note- ok to leave a detailed message with response if no answer

## 2022-05-05 NOTE — Telephone Encounter (Signed)
Call returned to patients daughter and Jessica's response relayed. Melanie verbalized understanding

## 2022-05-10 ENCOUNTER — Other Ambulatory Visit: Payer: Self-pay | Admitting: Nurse Practitioner

## 2022-05-10 DIAGNOSIS — F32A Depression, unspecified: Secondary | ICD-10-CM

## 2022-05-10 DIAGNOSIS — F419 Anxiety disorder, unspecified: Secondary | ICD-10-CM

## 2022-05-10 DIAGNOSIS — F0390 Unspecified dementia without behavioral disturbance: Secondary | ICD-10-CM

## 2022-05-10 DIAGNOSIS — F411 Generalized anxiety disorder: Secondary | ICD-10-CM

## 2022-05-10 DIAGNOSIS — G4700 Insomnia, unspecified: Secondary | ICD-10-CM

## 2022-05-11 NOTE — Telephone Encounter (Signed)
Hig warning came up when trying to fill medication.   Medications pended and sent to Sherrie Mustache, NP for approval

## 2022-05-24 ENCOUNTER — Ambulatory Visit: Payer: Medicare PPO | Admitting: Psychiatry

## 2022-06-27 ENCOUNTER — Ambulatory Visit: Payer: Medicare PPO | Admitting: Nurse Practitioner

## 2022-07-06 ENCOUNTER — Encounter: Payer: Self-pay | Admitting: Nurse Practitioner

## 2022-07-08 ENCOUNTER — Ambulatory Visit (INDEPENDENT_AMBULATORY_CARE_PROVIDER_SITE_OTHER): Payer: Medicare PPO | Admitting: Nurse Practitioner

## 2022-07-08 ENCOUNTER — Encounter: Payer: Self-pay | Admitting: Nurse Practitioner

## 2022-07-08 VITALS — BP 136/80 | HR 60 | Temp 97.3°F | Ht 64.0 in | Wt 217.0 lb

## 2022-07-08 DIAGNOSIS — F5101 Primary insomnia: Secondary | ICD-10-CM

## 2022-07-08 DIAGNOSIS — F411 Generalized anxiety disorder: Secondary | ICD-10-CM | POA: Diagnosis not present

## 2022-07-08 DIAGNOSIS — I2510 Atherosclerotic heart disease of native coronary artery without angina pectoris: Secondary | ICD-10-CM | POA: Diagnosis not present

## 2022-07-08 DIAGNOSIS — I10 Essential (primary) hypertension: Secondary | ICD-10-CM | POA: Diagnosis not present

## 2022-07-08 DIAGNOSIS — Z6837 Body mass index (BMI) 37.0-37.9, adult: Secondary | ICD-10-CM | POA: Diagnosis not present

## 2022-07-08 DIAGNOSIS — N6325 Unspecified lump in the left breast, overlapping quadrants: Secondary | ICD-10-CM | POA: Diagnosis not present

## 2022-07-08 DIAGNOSIS — F03918 Unspecified dementia, unspecified severity, with other behavioral disturbance: Secondary | ICD-10-CM

## 2022-07-08 DIAGNOSIS — E661 Drug-induced obesity: Secondary | ICD-10-CM | POA: Diagnosis not present

## 2022-07-08 MED ORDER — BUSPIRONE HCL 15 MG PO TABS: 15.0000 mg | ORAL_TABLET | Freq: Three times a day (TID) | ORAL | 1 refills | Status: DC

## 2022-07-08 NOTE — Progress Notes (Signed)
Careteam: Patient Care Team: [name], NP as PCP - General (Geriatric Medicine) [name], MD as PCP - Cardiology (Cardiology)  PLACE OF SERVICE:  [facility]  Advanced Directive information Does Patient Have a Medical Advance Directive?: Yes, Type of Advance Directive: Healthcare Power of Attorney, Does patient want to make changes to medical advance directive?: No - Patient declined  Allergies  Allergen Reactions   Lisinopril Other (See Comments)   Losartan Other (See Comments)   Memantine Other (See Comments)    combative   Peanut-Containing Drug Products    Tramadol Hives    Chief Complaint  Patient presents with   Medical Management of Chronic Issues    6 month follow-up. No recent labs to discuss.  Discuss need for shingrix, td/tdap, and additional covid boosters or post pone if patient refuses. NCIR verified. Patient denies receiving any vaccines since last visit. Discuss buspar and dose change. Examine left breast, change in size x 1 month. Here with 2 daughters.      HPI: Patient is a 81 y.o. female for routine follow up.   She could not take resulti- family increase buspar to 10 mg TID which has helped.   Breast mass noted to left breast- daughter states the last time she had a mammogram it was awful.  She has had a cyst in the past.   She sleeps a lot during the day but when she gets up she is ready to go. 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   Review of Systems:  Review of Systems  Unable to perform ROS: Dementia    Past Medical History:  Diagnosis Date   Gallbladder disorder 2009   Per New Patient Packet    Heart disease    High blood pressure    Per New Patient Packet    High cholesterol    Past Surgical History:  Procedure Laterality Date   ABDOMINAL HYSTERECTOMY     Per New Patient Packet    CARDIAC SURGERY  2011   Open  Heart Surgey, Per New Patient Packet     REPLACEMENT TOTAL KNEE Left 2010   Per New Patient Packet    Social History:   reports that she quit smoking about 35 years ago. Her smoking use included cigarettes. She has never used smokeless tobacco. She reports that she does not currently use drugs. She reports that she does not drink alcohol.  Family History  Problem Relation Age of Onset   Dementia Mother    Arthritis Mother    Breast cancer Mother    Heart attack Father    Arthritis Father    Heart attack Sister    Arthritis Sister    Breast cancer Sister     Medications: Patient's Medications  New Prescriptions   No medications on file  Previous Medications   ACETAMINOPHEN (TYLENOL) 325 MG TABLET    Take 650 mg by mouth daily.    ASCORBIC ACID (VITAMIN C) 1000 MG TABLET    Take 1 tablet (1,000 mg total) by mouth daily.   ASPIRIN EC 81 MG TABLET    Take 1 tablet (81 mg total) by mouth daily. Swallow whole.   ATORVASTATIN (LIPITOR) 40 MG TABLET    TAKE 1 TABLET BY MOUTH EVERY DAY   BUSPIRONE (BUSPAR) 10 MG TABLET    TAKE 1 TABLET  BY MOUTH TWICE A DAY   CHOLECALCIFEROL (VITAMIN D3) 25 MCG (1000 UNIT) TABLET    Take 1 tablet (1,000 Units total) by mouth daily.   CITALOPRAM (CELEXA) 10 MG TABLET    TAKE 1 TABLET BY MOUTH EVERY DAY   CYANOCOBALAMIN 1000 MCG TABLET    Take 1,000 mcg by mouth daily.   DICLOFENAC SODIUM (VOLTAREN) 1 % GEL    Apply 4 g topically 4 (four) times daily. To both knees. May apply 2 gm to hands QID for arthritis   DONEPEZIL (ARICEPT) 10 MG TABLET    TAKE 1 TABLET BY MOUTH EVERYDAY AT BEDTIME   INCONTINENCE SUPPLY DISPOSABLE MISC    XL incontinence brief to use as directed Dx: incontinence of urine   TRAZODONE (DESYREL) 50 MG TABLET    TAKE 1 TABLET BY MOUTH EVERY DAY IN THE MORNING AND 1/2 OF A TABLET IN THE EVENING  Modified Medications   No medications on file  Discontinued Medications   BREXPIPRAZOLE 0.5 MG TABS    Take 1 tablet (0.5 mg total) by mouth  daily.    Physical Exam:  Vitals:   07/08/22 0753  BP: 136/80  Pulse: 60  Temp: (!) 97.3 F (36.3 C)  TempSrc: Temporal  SpO2: 98%  Weight: 217 lb (98.4 kg)  Height: 5\' 4"  (1.626 m)   Body mass index is 37.25 kg/m. Wt Readings from Last 3 Encounters:  07/08/22 217 lb (98.4 kg)  04/07/22 221 lb (100.2 kg)  03/25/22 224 lb (101.6 kg)    Physical Exam Constitutional:      General: She is not in acute distress.    Appearance: She is well-developed. She is not diaphoretic.  HENT:     Head: Normocephalic and atraumatic.     Mouth/Throat:     Pharynx: No oropharyngeal exudate.  Eyes:     Conjunctiva/sclera: Conjunctivae normal.     Pupils: Pupils are equal, round, and reactive to light.  Cardiovascular:     Rate and Rhythm: Normal rate and regular rhythm.     Heart sounds: Normal heart sounds.  Pulmonary:     Effort: Pulmonary effort is normal.     Breath sounds: Normal breath sounds.  Chest:  Breasts:    Left: Mass present.       Comments: Large mass felt to left upper breast ~12oclock Abdominal:     General: Bowel sounds are normal.     Palpations: Abdomen is soft.  Musculoskeletal:     Cervical back: Normal range of motion and neck supple.     Right lower leg: No edema.     Left lower leg: No edema.  Skin:    General: Skin is warm and dry.  Neurological:     Mental Status: She is alert.  Psychiatric:        Mood and Affect: Mood normal.     Labs reviewed: Basic Metabolic Panel: Recent Labs    12/15/21 1106 04/14/22 0000  NA 139 140  K 4.0 3.6  CL 105 107  CO2 26 26*  GLUCOSE 87  --   BUN 7 9  CREATININE 0.96* 1.0  CALCIUM 9.6 9.5   Liver Function Tests: Recent Labs    12/15/21 1106 04/14/22 0000  AST 16 30  ALT 11 23  ALKPHOS  --  95  BILITOT 0.5  --   PROT 7.2  --   ALBUMIN  --  4.1   No results for input(s): "LIPASE", "AMYLASE" in the last 8760  hours. No results for input(s): "AMMONIA" in the last 8760 hours. CBC: Recent Labs     12/15/21 1106 04/14/22 0000  WBC 4.2 6.2  NEUTROABS 2,037 3.20  HGB 13.7 13.2  HCT 41.7 40  MCV 86.5  --   PLT 195 193   Lipid Panel: Recent Labs    12/15/21 1106  CHOL 157  HDL 61  LDLCALC 82  TRIG 64  CHOLHDL 2.6   TSH: No results for input(s): "TSH" in the last 8760 hours. A1C: No results found for: "HGBA1C"   Assessment/Plan 1. Generalized anxiety disorder Ongoing, will titrate off celexa and can increase buspar to 15 mg TID, will continue to monitor - busPIRone (BUSPAR) 15 MG tablet; Take 1 tablet (15 mg total) by mouth 3 (three) times daily.  Dispense: 180 tablet; Refill: 1  2. Mass overlapping multiple quadrants of left breast Large mass noted to left upper breast. Family does not wish to have any imaging or work-up due to patients dementia and able to tolerate mammogram or ultrasound.   3. Dementia with behavioral disturbance (HCC) -Stable, no acute changes in cognitive or functional status but progress decline, ongoing anxiety and behaviors/agitation, continue supportive care.   4. Essential hypertension Blood pressure well controlled, goal bp <140/90 Continue current medications and dietary modifications follow metabolic panel  5. Coronary artery disease involving native coronary artery of native heart without angina pectoris -without chest pains, continues on ASA  6. Primary insomnia -controlled on trazodone.   7. Class 2 drug-induced obesity with serious comorbidity and body mass index (BMI) of 37.0 to 37.9 in adult -pt with weight loss due to disease progression of dementia, encouraged healthy dietary choices, she has started to decrease intake.   Return in about 4 months (around 11/07/2022) for routine follow up. Janene Harvey. Biagio Borg  [facility] [phone]

## 2022-07-08 NOTE — Patient Instructions (Addendum)
Decrease celexa to 5 mg daily (half tablet)for 2 weeks then take every other day for 2 weeks then stop  Increase buspar to 15 mg by mouth three times daily

## 2022-07-22 ENCOUNTER — Other Ambulatory Visit: Payer: Self-pay | Admitting: Orthopedic Surgery

## 2022-07-22 ENCOUNTER — Telehealth: Payer: Self-pay

## 2022-07-22 DIAGNOSIS — F32A Depression, unspecified: Secondary | ICD-10-CM

## 2022-07-22 DIAGNOSIS — G4700 Insomnia, unspecified: Secondary | ICD-10-CM

## 2022-07-22 MED ORDER — TRAZODONE HCL 50 MG PO TABS: 50.0000 mg | ORAL_TABLET | Freq: Two times a day (BID) | ORAL | 1 refills | Status: DC

## 2022-07-22 NOTE — Telephone Encounter (Signed)
Patent's daughter called stating that mother seemd to be fighting slepp and just not resting. She stated that patient had been up since 1am and just recently went to sleep. She stated that she didn't know whether there needed to be another change in patient's medication that was changed 2 weeks ago (buspiron). Seems like patient is fighting against the medication.  Should medication be changed or is this just part of the disease process.  Please advise.  Message routed to Windell Moulding, NP

## 2022-07-25 ENCOUNTER — Other Ambulatory Visit: Payer: Self-pay | Admitting: Adult Health

## 2022-07-25 ENCOUNTER — Telehealth (INDEPENDENT_AMBULATORY_CARE_PROVIDER_SITE_OTHER): Payer: Medicare PPO | Admitting: Adult Health

## 2022-07-25 ENCOUNTER — Encounter: Payer: Self-pay | Admitting: Adult Health

## 2022-07-25 VITALS — Ht 64.0 in | Wt 217.0 lb

## 2022-07-25 DIAGNOSIS — F5101 Primary insomnia: Secondary | ICD-10-CM

## 2022-07-25 DIAGNOSIS — G4700 Insomnia, unspecified: Secondary | ICD-10-CM

## 2022-07-25 DIAGNOSIS — F03911 Unspecified dementia, unspecified severity, with agitation: Secondary | ICD-10-CM

## 2022-07-25 DIAGNOSIS — R350 Frequency of micturition: Secondary | ICD-10-CM | POA: Diagnosis not present

## 2022-07-25 MED ORDER — TRAZODONE HCL 50 MG PO TABS: 50.0000 mg | ORAL_TABLET | Freq: Every day | ORAL | 1 refills | Status: DC

## 2022-07-25 MED ORDER — QUETIAPINE FUMARATE 25 MG PO TABS: 12.5000 mg | ORAL_TABLET | Freq: Two times a day (BID) | ORAL | 0 refills | Status: DC

## 2022-07-25 NOTE — Patient Instructions (Signed)
Dementia Dementia is a condition that affects the way the brain works. It often affects memory and thinking. There are many types of dementia. Some types get worse with time and cannot be reversed. Some types of dementia include: Alzheimer's disease. This is the most common type. Vascular dementia. This type may happen due to a stroke. Lewy body dementia. This type may happen to people who have Parkinson's disease. Frontotemporal dementia. This type is caused by damage to nerve cells in certain parts of the brain. Some people may have more than one type. What are the causes? This condition is caused by damage to cells in the brain. Some causes that cannot be reversed include: Having a condition that affects the blood vessels of the brain, such as diabetes, heart disease, or blood vessel disease. Changes to genes. Some causes that can be reversed or slowed include: Injury to the brain. Certain medicines. Infection. Not having enough vitamin B12 in the body, or thyroid problems. A tumor, blood clot, or too much fluid in the brain. Certain diseases that cause your body's defense system (immune system) to attack healthy parts of the body. What are the signs or symptoms? Problems remembering events or people. Having trouble taking a bath or putting clothes on. Forgetting appointments. Forgetting to pay bills. Trouble planning and making meals. Having trouble speaking. Getting lost easily. Changes in behavior or mood. How is this treated? Treatment depends on the cause of the dementia. It might include: Taking medicines for symptoms or to help control or slow down the dementia. Treating the cause of your dementia. Your doctor can help you find support groups and other doctors who can help with your care. Follow these instructions at home: Medicines Take over-the-counter and prescription medicines only as told by your doctor. Use a pill organizer to help you manage your  medicines. Avoidtaking medicines for pain or for sleep. Lifestyle Make healthy choices: Be active as told by your doctor. Do not smoke or use any products that contain nicotine or tobacco. If you need help quitting, ask your doctor. Do not drink alcohol. When you get stressed, do something that will help you relax. Your doctor can give you tips. Spend time with other people. Make sure you get good sleep. To get good sleep: Try not to take naps during the day. Keep your bedroom dark and cool. In the few hours before you go to bed, try not to do any exercise. Do not have foods and drinks with caffeine at night. Eating and drinking Drink enough fluid to keep your pee (urine) pale yellow. Eat a healthy diet. General instructions  Talk with your doctor to figure out: What you need help with. What your safety needs are. Ask your doctor if it is safe for you to drive. If told, wear a bracelet that tracks where you are or shows that you are a person with memory loss. Work with your family to make big decisions. Keep all follow-up visits. Where to find more information Alzheimer's Association: [employer] National Institute on Aging: DVDEnthusiasts.nl World Health Organization: RoleLink.com.br Contact a doctor if: You have any new symptoms. Your symptoms get worse. You have problems with swallowing or choking. Get help right away if: You feel very sad, or feel that you want to harm yourself. Your family members are worried for your safety. Get help right away if you feel like you may hurt yourself or others, or have thoughts about taking your own life. Go to your nearest emergency room or:  Call your local emergency services (911 in the [place] Call the [facility] at (484) 141-2846 or 988 in the [place] This is open 24 hours a day. Text the Crisis Text Line at 6035047898. Summary Dementia often affects memory and thinking. Some types of dementia get worse with  time and cannot be reversed. Treatment for this condition depends on the cause. Talk with your doctor to figure out what you need help with. Your doctor can help you find support groups and other doctors who can help with your care. This information is not intended to replace advice given to you by your health care provider. Make sure you discuss any questions you have with your health care provider. Document Revised: 01/20/2021 Document Reviewed: 11/11/2019 Elsevier Patient Education  [place].

## 2022-07-25 NOTE — Progress Notes (Signed)
DATE:  07/25/2022  MRN:  [id number]  BIRTHDAY: 02-Aug-1940   Contact Information     Name Relation Home Work Mobile   Sepulveda,Thomas Spouse [phone]     Dolphus Jenny Daughter [phone]        Provider location:  at [facility] clinic  Patient location:  at [facility]   Chief Complaint  Patient presents with   Acute Visit    Patient is being seen for confusion and restlessness. This is a video visit with the patients daughter [phone]    HISTORY OF PRESENT ILLNESS: This is an 82 year old who had a video visit today. Daughter was in the video visit, as well, and was giving the history of the patient. Patient was reported to be restless and confused.  She has dementia and takes Aricept. Per daughter, patient thinks she is not at home and has been wanting to get get out of the house. Patient sometimes gets physical and would push them away when they try to stop her from getting out of the house. She takes Trazodone 50 mg in the morning and 25 mg at bedtime for anxiety.She was recently noted to have a mass on her left breast. Daughter stated that the family decided not to have a work up on her left breast and wanted her to be comfortable.   PAST MEDICAL HISTORY:  Past Medical History:  Diagnosis Date   Gallbladder disorder 2009   Per New Patient Packet    Heart disease    High blood pressure    Per New Patient Packet    High cholesterol      CURRENT MEDICATIONS: Reviewed  Patient's Medications  New Prescriptions   No medications on file  Previous Medications   ACETAMINOPHEN (TYLENOL) 325 MG TABLET    Take 650 mg by mouth daily.    ASCORBIC ACID (VITAMIN C) 1000 MG TABLET    Take 1 tablet (1,000 mg total) by mouth daily.   ASPIRIN EC 81 MG TABLET    Take 1 tablet (81 mg total) by mouth daily. Swallow whole.   ATORVASTATIN (LIPITOR) 40 MG TABLET    TAKE 1 TABLET BY MOUTH EVERY DAY   BUSPIRONE (BUSPAR) 15 MG TABLET    Take 1 tablet (15 mg total)  by mouth 3 (three) times daily.   CHOLECALCIFEROL (VITAMIN D3) 25 MCG (1000 UNIT) TABLET    Take 1 tablet (1,000 Units total) by mouth daily.   CYANOCOBALAMIN 1000 MCG TABLET    Take 1,000 mcg by mouth daily.   DICLOFENAC SODIUM (VOLTAREN) 1 % GEL    Apply 4 g topically 4 (four) times daily. To both knees. May apply 2 gm to hands QID for arthritis   DONEPEZIL (ARICEPT) 10 MG TABLET    TAKE 1 TABLET BY MOUTH EVERYDAY AT BEDTIME   INCONTINENCE SUPPLY DISPOSABLE MISC    XL incontinence brief to use as directed Dx: incontinence of urine   TRAZODONE (DESYREL) 50 MG TABLET    Take 1 tablet (50 mg total) by mouth 2 (two) times daily.  Modified Medications   No medications on file  Discontinued Medications   No medications on file     Allergies  Allergen Reactions   Lisinopril Other (See Comments)   Losartan Other (See Comments)   Memantine Other (See Comments)    combative   Peanut-Containing Drug Products    Tramadol Hives     REVIEW OF SYSTEMS:  GENERAL: no change in appetite, no fatigue, no weight  changes, no fever, chills or weakness SKIN: Left breast mass NOSE: Denies nasal congestion or epistaxis MOUTH and THROAT: Denies oral discomfort, gingival pain or bleeding, pain from teeth or hoarseness   RESPIRATORY: no cough, SOB, DOE, wheezing, hemoptysis CARDIAC: no chest pain, edema or palpitations GI: no abdominal pain, diarrhea, constipation, heart burn, nausea or vomiting GU: increase urinary frequency MUSCULOSKELETAL: walks independently NEUROLOGICAL: Denies dizziness, syncope, numbness, or headache PSYCHIATRIC: has insomnia and agitation     LABS/RADIOLOGY: Labs reviewed: Basic Metabolic Panel: Recent Labs    12/15/21 1106 04/14/22 0000  NA 139 140  K 4.0 3.6  CL 105 107  CO2 26 26*  GLUCOSE 87  --   BUN 7 9  CREATININE 0.96* 1.0  CALCIUM 9.6 9.5   Liver Function Tests: Recent Labs    12/15/21 1106 04/14/22 0000  AST 16 30  ALT 11 23  ALKPHOS  --  95   BILITOT 0.5  --   PROT 7.2  --   ALBUMIN  --  4.1   No results for input(s): "LIPASE", "AMYLASE" in the last 8760 hours. No results for input(s): "AMMONIA" in the last 8760 hours. CBC: Recent Labs    12/15/21 1106 04/14/22 0000  WBC 4.2 6.2  NEUTROABS 2,037 3.20  HGB 13.7 13.2  HCT 41.7 40  MCV 86.5  --   PLT 195 193   A1C: Invalid input(s): "A1C" Lipid Panel: Recent Labs    12/15/21 1106  HDL 61   Cardiac Enzymes: No results for input(s): "CKTOTAL", "CKMB", "CKMBINDEX", "TROPONINI" in the last 8760 hours. BNP: Invalid input(s): "POCBNP" CBG: No results for input(s): "GLUCAP" in the last 8760 hours.    No results found.  ASSESSMENT/PLAN:  1. 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  Dispense: 30 tablet; Refill: 0  2. Primary insomnia -  discontinue Trazodone 50 mg in the morning ang increase Trazodone 25 mg at HS to 50 mg Q HS - traZODone (DESYREL) 50 MG tablet; Take 1 tablet (50 mg total) by mouth at bedtime.  Dispense: 30 tablet; Refill: 1  3. Frequency of urination -  will check for UTI - Urine Culture; Future - POC Urinalysis Dipstick; Future    Time spent on non face to face visit:  12 minutes  The patient gave consent to this video visit. Explained to the patient the risk and privacy issue that was involved with this video call.   The patient was advised to call back and ask for an in-person evaluation if the symptoms worsen or if the condition fails to improve.   Durenda Age, NP [employer] [phone]

## 2022-07-25 NOTE — Telephone Encounter (Signed)
LATE ENTRY:  07/22/22 Spoke with daughter, she verbalized her understanding and agreed.

## 2022-07-26 ENCOUNTER — Telehealth: Payer: Self-pay | Admitting: *Deleted

## 2022-07-26 NOTE — Telephone Encounter (Signed)
Received message that patient's Seroquel prescribed yesterday needs Prior Authorization.   Prior Authorization was submitted through CoverMyMeds and went into Determination. Decision within 3-7 days from submission.   KeyBebe Liter PA Case #:[id number]

## 2022-07-27 NOTE — Telephone Encounter (Signed)
Medication approved. Authorization is good until 07/11/2023.

## 2022-07-29 ENCOUNTER — Telehealth: Payer: Self-pay

## 2022-07-29 ENCOUNTER — Encounter: Payer: Medicare PPO | Admitting: Family

## 2022-07-29 DIAGNOSIS — L03114 Cellulitis of left upper limb: Secondary | ICD-10-CM | POA: Diagnosis not present

## 2022-07-29 DIAGNOSIS — M25522 Pain in left elbow: Secondary | ICD-10-CM | POA: Diagnosis not present

## 2022-07-29 DIAGNOSIS — M13822 Other specified arthritis, left elbow: Secondary | ICD-10-CM | POA: Diagnosis not present

## 2022-07-29 NOTE — Telephone Encounter (Signed)
Spoke with Melanie and relayed Monina's response.

## 2022-07-29 NOTE — Telephone Encounter (Signed)
Below is Laurie's reply:    Mays, Laurie C, NP  You1 hour ago (1:28 PM)    Per her record, Buspar 15 mg is taken TID. How does she take Buspar?   Spoke with patients daughter, patient is taking Buspar three times daily. 1st dose is between 9-10 am, second dose 3 pm, and third dose is 9-9:30 pm. [name] states she is questioning the 3 pm dose as she then takes Seroquel at 5 pm.  Please advise

## 2022-07-29 NOTE — Telephone Encounter (Signed)
Patients daughter called concerned about current medication regimen.  Patient is taking Seroquel, 1 am, 1 at 5 pm, and Buspar at 3 pm, following a recent visit with Durenda Age, NP.   Patient is finding it difficult to get up and walk to the bathroom, the family has to assist her, prior to medication change patient was able to ambulate to the bathroom on her own. The above regimen has improved behaviors, however now [name] is concerned about patients ability to ambulate and questions if the dose of Buspar should be spread out more from the Seroquel.  Please advise

## 2022-08-01 ENCOUNTER — Encounter: Payer: Self-pay | Admitting: Family

## 2022-08-01 ENCOUNTER — Telehealth (INDEPENDENT_AMBULATORY_CARE_PROVIDER_SITE_OTHER): Payer: Medicare PPO | Admitting: Family

## 2022-08-01 DIAGNOSIS — F03911 Unspecified dementia, unspecified severity, with agitation: Secondary | ICD-10-CM

## 2022-08-01 MED ORDER — QUETIAPINE FUMARATE 25 MG PO TABS: 25.0000 mg | ORAL_TABLET | Freq: Two times a day (BID) | ORAL | 1 refills | Status: DC

## 2022-08-01 NOTE — Progress Notes (Signed)
This service is provided via telemedicine  No vital signs collected/recorded due to the encounter was a telemedicine visit.   Location of patient (ex: home, work):  Home  Patient consents to a telephone visit:  Yes  Location of the provider (ex: office, home):  [employer].   Name of any referring provider:  Lauree Chandler, NP   Names of all persons participating in the telemedicine service and their role in the encounter:  Patient, Carolyne Fiscal Niece, Heriberto Antigua, [place], Tee Richeson, Webb Silversmith, NP.    Time spent on call: 8 minutes spent on the phone with Medical Assistant.     Provider: Chanel Mckesson FNP-C  Lauree Chandler, NP  Patient Care Team: Lauree Chandler, NP as PCP - General (Geriatric Medicine) Buford Dresser, MD as PCP - Cardiology (Cardiology)  Extended Emergency Contact Information Primary Emergency Contact: Harriette Ohara Address: [address], [place] [address] Johnnette Litter of [place] Phone: [phone] Relation: Spouse Secondary Emergency Contact: Dolphus Jenny Home Phone: [phone] Work Phone: [phone] Mobile Phone: [phone] Relation: Daughter  Code Status: Full Code  Goals of care: Advanced Directive information    08/01/2022    2:38 PM  Advanced Directives  Does Patient Have a Medical Advance Directive? Yes  Type of Advance Directive [place]  Does patient want to make changes to medical advance directive? No - Patient declined  Copy of [place] in Chart? Yes - validated most recent copy scanned in chart (See row information)     Chief Complaint  Patient presents with   Medication Problem    Patient niece Carolyne Fiscal states that Seroquel isnt helping patient relax.     HPI:  Pt is a 82 y.o. female seen today for an acute visit for evaluation of increased agitation.Patient seen sitting on recliner.HPI provided by patient's Husband and granddaughter. States  tries to get out of the chair by herself.also wakes up in the middle of the night and talks through out. Sometimes attempts to fight care giver. Patient has been agitated the whole day.Care giver states Seroquel 12.5 mg tablet nt helping with her agitation. No recent acute issues.    Past Medical History:  Diagnosis Date   Gallbladder disorder 2009   Per New Patient Packet    Heart disease    High blood pressure    Per New Patient Packet    High cholesterol    Past Surgical History:  Procedure Laterality Date   ABDOMINAL HYSTERECTOMY     Per New Patient Packet    CARDIAC SURGERY  2011   Open Heart Surgey, Per New Patient Packet     REPLACEMENT TOTAL KNEE Left 2010   Per New Patient Packet     Allergies  Allergen Reactions   Lisinopril Other (See Comments)   Losartan Other (See Comments)   Memantine Other (See Comments)    combative   Peanut-Containing Drug Products    Tramadol Hives    Outpatient Encounter Medications as of 08/01/2022  Medication Sig   acetaminophen (TYLENOL) 325 MG tablet Take 650 mg by mouth daily.    Ascorbic Acid (VITAMIN C) 1000 MG tablet Take 1 tablet (1,000 mg total) by mouth daily.   aspirin EC 81 MG tablet Take 1 tablet (81 mg total) by mouth daily. Swallow whole.   atorvastatin (LIPITOR) 40 MG tablet TAKE 1 TABLET BY MOUTH EVERY DAY   busPIRone (BUSPAR) 15 MG tablet  Take 1 tablet (15 mg total) by mouth 3 (three) times daily.   cholecalciferol (VITAMIN D3) 25 MCG (1000 UNIT) tablet Take 1 tablet (1,000 Units total) by mouth daily.   cyanocobalamin 1000 MCG tablet Take 1,000 mcg by mouth daily.   diclofenac sodium (VOLTAREN) 1 % GEL Apply 4 g topically 4 (four) times daily. To both knees. May apply 2 gm to hands QID for arthritis   donepezil (ARICEPT) 10 MG tablet TAKE 1 TABLET BY MOUTH EVERYDAY AT BEDTIME   Incontinence Supply Disposable MISC XL incontinence brief to use as directed Dx: incontinence of urine   QUEtiapine (SEROQUEL) 25 MG tablet  Take 0.5 tablets (12.5 mg total) by mouth 2 (two) times daily.   traZODone (DESYREL) 50 MG tablet Take 1 tablet (50 mg total) by mouth at bedtime.   No facility-administered encounter medications on file as of 08/01/2022.    Review of Systems  Constitutional:  Negative for appetite change, chills, fatigue, fever and unexpected weight change.  HENT:  Negative for congestion, dental problem, ear discharge, ear pain, nosebleeds, postnasal drip, rhinorrhea, sinus pressure, sinus pain, sneezing, sore throat, tinnitus and trouble swallowing.   Eyes:  Negative for pain, discharge, redness, itching and visual disturbance.  Respiratory:  Negative for cough, chest tightness, shortness of breath and wheezing.   Cardiovascular:  Negative for chest pain, palpitations and leg swelling.  Gastrointestinal:  Negative for abdominal distention, abdominal pain, constipation, diarrhea, nausea and vomiting.  Genitourinary:  Negative for difficulty urinating, dysuria, flank pain, frequency and urgency.  Musculoskeletal:  Positive for gait problem. Negative for arthralgias, back pain and joint swelling.  Skin:  Negative for color change, pallor and rash.  Neurological:  Negative for dizziness, weakness, light-headedness and headaches.  Psychiatric/Behavioral:  Positive for agitation. Negative for behavioral problems, confusion, hallucinations and sleep disturbance. The patient is not nervous/anxious.     Immunization History  Administered Date(s) Administered   Fluad Quad(high Dose 65+) 04/24/2019, 04/10/2020, 06/16/2021, 03/25/2022   Influenza, High Dose Seasonal PF 07/11/1968, 05/25/2017, 05/23/2018   PFIZER(Purple Top)SARS-COV-2 Vaccination 09/02/2019, 09/23/2019, 06/25/2020   Pneumococcal Conjugate-13 01/19/2018   Pneumococcal Polysaccharide-23 07/15/2020   Tdap 07/11/1988   Pertinent  Health Maintenance Due  Topic Date Due   INFLUENZA VACCINE  Completed   DEXA SCAN  Discontinued      12/15/2021   10:57  AM 02/08/2022    3:20 PM 04/07/2022    1:10 PM 07/08/2022   10:12 AM 08/01/2022    2:38 PM  Fall Risk  Falls in the past year? 0 1 0 0 0  Was there an injury with Fall? 0 0 0 0 0  Fall Risk Category Calculator 0 1 0 0 0  Fall Risk Category (Retired) Low Low Low Low   (RETIRED) Patient Fall Risk Level Low fall risk Low fall risk Low fall risk Low fall risk   Patient at Risk for Falls Due to No Fall Risks No Fall Risks No Fall Risks No Fall Risks No Fall Risks  Fall risk Follow up Falls evaluation completed Falls evaluation completed Falls evaluation completed Falls evaluation completed Falls evaluation completed   Functional Status Survey:    There were no vitals filed for this visit. There is no height or weight on file to calculate BMI. Physical Exam Constitutional:      General: She is not in acute distress.    Appearance: She is not ill-appearing.  Pulmonary:     Effort: No respiratory distress.  Neurological:  Mental Status: She is alert. She is disoriented and confused.     Gait: Gait abnormal.  Psychiatric:        Behavior: Behavior is agitated.        Cognition and Memory: Memory is impaired.        Judgment: Judgment is inappropriate.    Labs reviewed: Recent Labs    12/15/21 1106 04/14/22 0000  NA 139 140  K 4.0 3.6  CL 105 107  CO2 26 26*  GLUCOSE 87  --   BUN 7 9  CREATININE 0.96* 1.0  CALCIUM 9.6 9.5   Recent Labs    12/15/21 1106 04/14/22 0000  AST 16 30  ALT 11 23  ALKPHOS  --  95  BILITOT 0.5  --   PROT 7.2  --   ALBUMIN  --  4.1   Recent Labs    12/15/21 1106 04/14/22 0000  WBC 4.2 6.2  NEUTROABS 2,037 3.20  HGB 13.7 13.2  HCT 41.7 40  MCV 86.5  --   PLT 195 193   Lab Results  Component Value Date   TSH 0.92 11/11/2020   No results found for: "HGBA1C" Lab Results  Component Value Date   CHOL 157 12/15/2021   HDL 61 12/15/2021   LDLCALC 82 12/15/2021   TRIG 64 12/15/2021   CHOLHDL 2.6 12/15/2021    Significant  Diagnostic Results in last 30 days:  No results found.  Assessment/Plan  Agitation due to dementia [facility]) Increased agitation and confusion. - increase Seroquel from 12.5 mg twice daily to 25 mg tablet twice daily  - Obtain urine specimen for U/A and C/S to rule UTI  Patient's Husband has a Urine sterile cup will drop urine to the office lab - continue supportive care and monitor  - continue on Aricept ,Buspar - QUEtiapine (SEROQUEL) 25 MG tablet; Take 1 tablet (25 mg total) by mouth 2 (two) times daily.  Dispense: 60 tablet; Refill: 1 Has urine culture ordered 07/25/2022 by Trish Mage NP   Family/ staff Communication: Reviewed plan of care with patient verbalized understanding   Labs/tests ordered: urine culture orders in place.    Next Appointment: Return if symptoms worsen or fail to improve.  I connected with  Renee Harder on 08/01/22 by a video enabled telemedicine application and verified that I am speaking with the correct person using two identifiers.   I discussed the limitations of evaluation and management by telemedicine. The patient expressed understanding and agreed to proceed.   Spent 11 minutes of face to face with patient  >50% time spent counseling; reviewing medical record; tests; labs; and developing future plan of care.   Caesar Bookman, NP

## 2022-08-02 ENCOUNTER — Telehealth: Payer: Medicare PPO | Admitting: Family

## 2022-08-02 DIAGNOSIS — R410 Disorientation, unspecified: Secondary | ICD-10-CM | POA: Diagnosis not present

## 2022-08-03 LAB — URINE CULTURE
MICRO NUMBER:: 14461920
Result:: NO GROWTH
SPECIMEN QUALITY:: ADEQUATE

## 2022-08-05 ENCOUNTER — Telehealth: Payer: Self-pay

## 2022-08-05 NOTE — Telephone Encounter (Signed)
Urine culture is normal no growth noted.

## 2022-08-05 NOTE — Telephone Encounter (Signed)
Laurie Mays, patients daughter called to inquire about urine culture results, as she seen the results populated on mychart however we had not call to give a directive.  I informed Laurie Mays that I will send a message to Webb Silversmith, who is covering for Lauree Chandler, NP to further advise.  I did share with Laurie Mays that the culture showed no growth, however I will have Dinah confirm and provide additional recommendations (if any).  Please advise

## 2022-08-10 ENCOUNTER — Telehealth: Payer: Self-pay

## 2022-08-10 NOTE — Telephone Encounter (Signed)
Patient's daughter called inquiring about liquid form of buspirone. She states that it's hard to get her to take the pill. She would like to know if it can be changed to a liquid form.  Message routed to Sherrie Mustache, NP

## 2022-08-11 NOTE — Telephone Encounter (Signed)
I am not sure if it comes in liquid form, they could crush and give in applesauce or another food to get her to take it if needed.

## 2022-08-16 NOTE — Telephone Encounter (Signed)
Spoke with [name], she verbalized her understanding and agreed.

## 2022-08-24 ENCOUNTER — Other Ambulatory Visit: Payer: Self-pay | Admitting: Family

## 2022-08-24 DIAGNOSIS — F03911 Unspecified dementia, unspecified severity, with agitation: Secondary | ICD-10-CM

## 2022-08-29 DIAGNOSIS — I10 Essential (primary) hypertension: Secondary | ICD-10-CM | POA: Diagnosis not present

## 2022-08-29 DIAGNOSIS — M79605 Pain in left leg: Secondary | ICD-10-CM | POA: Diagnosis not present

## 2022-08-30 ENCOUNTER — Encounter (HOSPITAL_COMMUNITY): Payer: Self-pay

## 2022-08-30 ENCOUNTER — Emergency Department (HOSPITAL_COMMUNITY)
Admission: EM | Admit: 2022-08-30 | Discharge: 2022-08-30 | Disposition: A | Discharge status: Home / Self Care | Payer: Medicare PPO | Attending: Emergency Medicine | Admitting: Emergency Medicine

## 2022-08-30 ENCOUNTER — Other Ambulatory Visit: Payer: Self-pay

## 2022-08-30 ENCOUNTER — Emergency Department (HOSPITAL_COMMUNITY): Payer: Medicare PPO

## 2022-08-30 DIAGNOSIS — M79652 Pain in left thigh: Secondary | ICD-10-CM | POA: Diagnosis not present

## 2022-08-30 DIAGNOSIS — N6320 Unspecified lump in the left breast, unspecified quadrant: Secondary | ICD-10-CM | POA: Insufficient documentation

## 2022-08-30 DIAGNOSIS — R918 Other nonspecific abnormal finding of lung field: Secondary | ICD-10-CM | POA: Insufficient documentation

## 2022-08-30 DIAGNOSIS — R791 Abnormal coagulation profile: Secondary | ICD-10-CM | POA: Insufficient documentation

## 2022-08-30 DIAGNOSIS — Z91010 Allergy to peanuts: Secondary | ICD-10-CM | POA: Diagnosis not present

## 2022-08-30 DIAGNOSIS — I2510 Atherosclerotic heart disease of native coronary artery without angina pectoris: Secondary | ICD-10-CM | POA: Insufficient documentation

## 2022-08-30 DIAGNOSIS — M1712 Unilateral primary osteoarthritis, left knee: Secondary | ICD-10-CM | POA: Diagnosis not present

## 2022-08-30 DIAGNOSIS — M778 Other enthesopathies, not elsewhere classified: Secondary | ICD-10-CM | POA: Diagnosis not present

## 2022-08-30 DIAGNOSIS — F0390 Unspecified dementia without behavioral disturbance: Secondary | ICD-10-CM | POA: Insufficient documentation

## 2022-08-30 DIAGNOSIS — Z87891 Personal history of nicotine dependence: Secondary | ICD-10-CM | POA: Insufficient documentation

## 2022-08-30 DIAGNOSIS — M25752 Osteophyte, left hip: Secondary | ICD-10-CM | POA: Diagnosis not present

## 2022-08-30 DIAGNOSIS — I700 Atherosclerosis of aorta: Secondary | ICD-10-CM | POA: Diagnosis not present

## 2022-08-30 DIAGNOSIS — Z7982 Long term (current) use of aspirin: Secondary | ICD-10-CM | POA: Diagnosis not present

## 2022-08-30 HISTORY — DX: Unspecified dementia, unspecified severity, without behavioral disturbance, psychotic disturbance, mood disturbance, and anxiety: F03.90

## 2022-08-30 LAB — CBC WITH DIFFERENTIAL/PLATELET
Abs Immature Granulocytes: 0.08 10*3/uL — ABNORMAL HIGH (ref 0.00–0.07)
Basophils Absolute: 0 10*3/uL (ref 0.0–0.1)
Basophils Relative: 0 %
Eosinophils Absolute: 0.1 10*3/uL (ref 0.0–0.5)
Eosinophils Relative: 1 %
HCT: 40.6 % (ref 36.0–46.0)
Hemoglobin: 12.9 g/dL (ref 12.0–15.0)
Immature Granulocytes: 1 %
Lymphocytes Relative: 28 %
Lymphs Abs: 2.3 10*3/uL (ref 0.7–4.0)
MCH: 26.8 pg (ref 26.0–34.0)
MCHC: 31.8 g/dL (ref 30.0–36.0)
MCV: 84.2 fL (ref 80.0–100.0)
Monocytes Absolute: 0.9 10*3/uL (ref 0.1–1.0)
Monocytes Relative: 11 %
Neutro Abs: 4.8 10*3/uL (ref 1.7–7.7)
Neutrophils Relative %: 59 %
Platelets: 210 10*3/uL (ref 150–400)
RBC: 4.82 MIL/uL (ref 3.87–5.11)
RDW: 14.6 % (ref 11.5–15.5)
WBC: 8.2 10*3/uL (ref 4.0–10.5)
nRBC: 0 % (ref 0.0–0.2)

## 2022-08-30 LAB — D-DIMER, QUANTITATIVE: D-Dimer, Quant: 8.56 ug/mL-FEU — ABNORMAL HIGH (ref 0.00–0.50)

## 2022-08-30 LAB — BASIC METABOLIC PANEL
Anion gap: 11 (ref 5–15)
BUN: 10 mg/dL (ref 8–23)
CO2: 23 mmol/L (ref 22–32)
Calcium: 9.1 mg/dL (ref 8.9–10.3)
Chloride: 105 mmol/L (ref 98–111)
Creatinine, Ser: 0.94 mg/dL (ref 0.44–1.00)
GFR, Estimated: 60 mL/min — ABNORMAL LOW (ref >60)
Glucose, Bld: 108 mg/dL — ABNORMAL HIGH (ref 70–99)
Potassium: 3.2 mmol/L — ABNORMAL LOW (ref 3.5–5.1)
Sodium: 139 mmol/L (ref 135–145)

## 2022-08-30 MED ORDER — IOHEXOL 300 MG/ML  SOLN
100.0000 mL | Freq: Once | INTRAMUSCULAR | Status: AC | PRN
  Administered 2022-08-30: 100 mL via INTRAVENOUS

## 2022-08-30 MED ORDER — HALOPERIDOL LACTATE 5 MG/ML IJ SOLN
5.0000 mg | Freq: Once | INTRAMUSCULAR | Status: DC
  Filled 2022-08-30: qty 1

## 2022-08-30 MED ORDER — SODIUM CHLORIDE (PF) 0.9 % IJ SOLN
INTRAMUSCULAR | Status: AC
  Filled 2022-08-30: qty 50

## 2022-08-30 NOTE — ED Notes (Signed)
Too agitated for in and out catheter. Purewick placed.

## 2022-08-30 NOTE — ED Notes (Signed)
Husbands at bedside speaking with EDP. Preferences to call his son and have him help with transporting back home after discharged.

## 2022-08-30 NOTE — ED Triage Notes (Deleted)
Pt c/o left thigh pain.

## 2022-08-30 NOTE — Discharge Instructions (Signed)
The cause of the patient's left thigh pain is unclear.  The CT scan did not show an infection, a broken bone, or a blood clot.  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

## 2022-08-30 NOTE — ED Triage Notes (Addendum)
Pt BIB EMS from home for left thigh pain. Per EMS pt "had no falls and just went to bed c/o left thigh pain".  No hx of blood clots. Hx of dementia

## 2022-08-30 NOTE — ED Provider Notes (Addendum)
[facility] DEPT Provider Note: Georgena Spurling, MD, FACEP  CSN: [id number] MRN: [id number] ARRIVAL: 08/30/22 at [place]: WA24/WA24   CHIEF COMPLAINT  Leg Pain  Level 5 caveat: Dementia HISTORY OF PRESENT ILLNESS  08/30/22 1:38 AM Laurie Mays is a 82 y.o. female with dementia but currently manifests itself with angry and defiant behavior.  She is here with 1 day of pain and swelling of her left thigh.  She is nonambulatory and her daughter does not believe she could have suffered trauma and thigh.  She does not like for the thigh to be moved or touched.  The family has also noticed a large mass in her left breast for about the past 3 months.  She has had no diagnostic imaging done on that due to the difficulty of getting the patient to a medical facility.  She has not had a fever that the family is aware of.   Past Medical History:  Diagnosis Date   Dementia ([place])    Gallbladder disorder 2009   Heart disease    High blood pressure    High cholesterol     Past Surgical History:  Procedure Laterality Date   ABDOMINAL HYSTERECTOMY     Per New Patient Packet    CARDIAC SURGERY  2011   Open Heart Surgey, Per New Patient Packet     REPLACEMENT TOTAL KNEE Left 2010   Per New Patient Packet     Family History  Problem Relation Age of Onset   Dementia Mother    Arthritis Mother    Breast cancer Mother    Heart attack Father    Arthritis Father    Heart attack Sister    Arthritis Sister    Breast cancer Sister     Social History   Tobacco Use   Smoking status: Former    Types: Cigarettes    Quit date: 07/12/1987    Years since quitting: 35.1   Smokeless tobacco: Never   Tobacco comments:    Quit about 30 years ago as of 2019   Vaping Use   Vaping Use: Never used  Substance Use Topics   Alcohol use: Never   Drug use: Not Currently    Prior to Admission medications   Medication Sig Start Date End Date Taking? Authorizing Provider  acetaminophen (TYLENOL)  325 MG tablet Take 650 mg by mouth daily as needed for moderate pain.   Yes [provider]  Ascorbic Acid (VITAMIN C) 1000 MG tablet Take 1 tablet (1,000 mg total) by mouth daily. 08/16/19  Yes Ngetich, Dinah C, NP  aspirin EC 81 MG tablet Take 1 tablet (81 mg total) by mouth daily. Swallow whole. 12/15/21  Yes Lauree Chandler, NP  atorvastatin (LIPITOR) 40 MG tablet TAKE 1 TABLET BY MOUTH EVERY DAY 11/04/21  Yes Lauree Chandler, NP  busPIRone (BUSPAR) 15 MG tablet Take 1 tablet (15 mg total) by mouth 3 (three) times daily. 07/08/22  Yes Lauree Chandler, NP  cholecalciferol (VITAMIN D3) 25 MCG (1000 UNIT) tablet Take 1 tablet (1,000 Units total) by mouth daily. 08/16/19  Yes Ngetich, Dinah C, NP  cyanocobalamin 1000 MCG tablet Take 1,000 mcg by mouth daily.   Yes [provider]  diclofenac sodium (VOLTAREN) 1 % GEL Apply 4 g topically 4 (four) times daily. To both knees. May apply 2 gm to hands QID for arthritis 11/21/18  Yes Lauree Chandler, NP  donepezil (ARICEPT) 10 MG tablet TAKE 1 TABLET BY  MOUTH EVERYDAY AT BEDTIME 05/11/22  Yes Lauree Chandler, NP  QUEtiapine (SEROQUEL) 25 MG tablet TAKE 1 TABLET BY MOUTH TWICE A DAY 08/24/22  Yes Lauree Chandler, NP  traZODone (DESYREL) 50 MG tablet Take 1 tablet (50 mg total) by mouth at bedtime. 07/25/22  Yes Medina-Vargas, Monina C, NP  Incontinence Supply Disposable MISC XL incontinence brief to use as directed Dx: incontinence of urine 04/10/20   Lauree Chandler, NP    Allergies Lisinopril, Losartan, Memantine, Peanut-containing drug products, and Tramadol   REVIEW OF SYSTEMS  Level 5 caveat   PHYSICAL EXAMINATION  Initial Vital Signs Blood pressure (!) 166/80, pulse 64, temperature 98.2 F (36.8 C), temperature source Oral, resp. rate 18, SpO2 100 %.  Examination General: Well-developed, well-nourished female in no acute distress; appearance consistent with age of record HENT: normocephalic; atraumatic Eyes:  Normal appearance Neck: supple Heart: regular rate and rhythm Lungs: clear to auscultation bilaterally Breasts: Large, indurated, nonfluctuant, nontender mass of left breast Abdomen: soft; nondistended; nontender; bowel sounds present Extremities: Tenderness of left thigh and especially left knee Neurologic: Awake, alert; motor function intact in all extremities and symmetric; no facial droop Skin: Warm and dry Psychiatric: Angry mood; argumentative and uncooperative   RESULTS  Summary of this visit's results, reviewed and interpreted by myself:   EKG Interpretation  Date/Time:    Ventricular Rate:    PR Interval:    QRS Duration:   QT Interval:    QTC Calculation:   R Axis:     Text Interpretation:         Laboratory Studies: Results for orders placed or performed during the hospital encounter of 08/30/22 (from the past 24 hour(s))  CBC with Differential     Status: Abnormal   Collection Time: 08/30/22  2:11 AM  Result Value Ref Range   WBC 8.2 4.0 - 10.5 K/uL   RBC 4.82 3.87 - 5.11 MIL/uL   Hemoglobin 12.9 12.0 - 15.0 g/dL   HCT 40.6 36.0 - 46.0 %   MCV 84.2 80.0 - 100.0 fL   MCH 26.8 26.0 - 34.0 pg   MCHC 31.8 30.0 - 36.0 g/dL   RDW 14.6 11.5 - 15.5 %   Platelets 210 150 - 400 K/uL   nRBC 0.0 0.0 - 0.2 %   Neutrophils Relative % 59 %   Neutro Abs 4.8 1.7 - 7.7 K/uL   Lymphocytes Relative 28 %   Lymphs Abs 2.3 0.7 - 4.0 K/uL   Monocytes Relative 11 %   Monocytes Absolute 0.9 0.1 - 1.0 K/uL   Eosinophils Relative 1 %   Eosinophils Absolute 0.1 0.0 - 0.5 K/uL   Basophils Relative 0 %   Basophils Absolute 0.0 0.0 - 0.1 K/uL   Immature Granulocytes 1 %   Abs Immature Granulocytes 0.08 (H) 0.00 - 0.07 K/uL  D-dimer, quantitative     Status: Abnormal   Collection Time: 08/30/22  2:11 AM  Result Value Ref Range   D-Dimer, Quant 8.56 (H) 0.00 - 0.50 ug/mL-FEU  Basic metabolic panel     Status: Abnormal   Collection Time: 08/30/22  2:11 AM  Result Value Ref  Range   Sodium 139 135 - 145 mmol/L   Potassium 3.2 (L) 3.5 - 5.1 mmol/L   Chloride 105 98 - 111 mmol/L   CO2 23 22 - 32 mmol/L   Glucose, Bld 108 (H) 70 - 99 mg/dL   BUN 10 8 - 23 mg/dL   Creatinine, Ser 0.94  0.44 - 1.00 mg/dL   Calcium 9.1 8.9 - 10.3 mg/dL   GFR, Estimated 60 (L) >60 mL/min   Anion gap 11 5 - 15   Imaging Studies: CT FEMUR LEFT W CONTRAST  Result Date: 08/30/2022 CLINICAL DATA:  Left thigh pain, swelling and possible infection. EXAM: CT OF THE LOWER LEFT EXTREMITY WITH CONTRAST TECHNIQUE: Multidetector CT imaging of the lower left extremity was performed according to the standard protocol following intravenous contrast administration. RADIATION DOSE REDUCTION: This exam was performed according to the departmental dose-optimization program which includes automated exposure control, adjustment of the mA and/or kV according to patient size and/or use of iterative reconstruction technique. CONTRAST:  176m OMNIPAQUE IOHEXOL 300 MG/ML  SOLN COMPARISON:  None Available. FINDINGS: Bones/Joint/Cartilage There is osteopenia without evidence of fractures or primary or pathologic bone lesions. There are no findings of acute osteomyelitis. There are enthesopathic changes of the left inferior pubic ramus. There is moderate joint space loss of the left hip and small acetabular and femoral head osteophytes. At the knee there is advanced tricompartmental bone-on-bone DJD with bulky osteophytes and juxtacortical degenerative cystic changes in the distal femur and proximal tibia. There is a moderate low-density suprapatellar bursal effusion without popliteal cyst. No hemarthrosis is seen. There is slight synovial thickening along the suprapatellar pouch with scattered synovial calcifications laterally, findings consistent with synovitis but without joint surface erosions. 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 Ligaments Suboptimally assessed by CT. Muscles and Tendons There is mild  fatty atrophy in the left gluteus maximus and left thigh musculature. The major tendons are not well seen with this technique but grossly intact as far as visualized. No intramuscular fluid collections or masses are seen. Soft tissues There is no significant edema in the thigh but there is mild edema laterally in the upper left foreleg. No fluid collections or masses are seen. No left iliofemoral or popliteal DVT is seen. There is mild iliofemoral calcific plaque with extension into the popliteal artery. No adenopathy is seen in the left pelvic wall and left inguinal chain. The visualized bladder and left adnexal space are unremarkable. Visualized pelvic bowel remarkable for uncomplicated sigmoid diverticula. There are postsurgical changes in the anterior pelvic wall. IMPRESSION: 1. Osteopenia and degenerative change without evidence of fractures, pathologic bone lesions or findings of acute osteomyelitis. 2. Moderate low-density suprapatellar bursal effusion with mild synovial thickening but no joint surface erosions. 3. No significant edema in the thigh but there is mild edema laterally in the upper left foreleg. 4. No left iliofemoral or popliteal DVT is seen. 5. Iliofemoral and popliteal mild calcific plaque. 6. Sigmoid diverticulosis. 7. Mild fatty atrophy of the left gluteus maximus and thigh musculature. Electronically Signed   By: KTelford NabM.D.   On: 08/30/2022 05:41   CT Chest W Contrast  Result Date: 08/30/2022 CLINICAL DATA:  82year old female with history of left chest wall mass. EXAM: CT CHEST WITH CONTRAST TECHNIQUE: Multidetector CT imaging of the chest was performed during intravenous contrast administration. RADIATION DOSE REDUCTION: This exam was performed according to the departmental dose-optimization program which includes automated exposure control, adjustment of the mA and/or kV according to patient size and/or use of iterative reconstruction technique. CONTRAST:  1095mOMNIPAQUE  IOHEXOL 300 MG/ML  SOLN COMPARISON:  Chest CT 05/26/2010. FINDINGS: Cardiovascular: Heart size is normal. There is no significant pericardial fluid, thickening or pericardial calcification. There is aortic atherosclerosis, as well as atherosclerosis of the great vessels of the mediastinum and the coronary arteries,  including calcified atherosclerotic plaque in the left anterior descending, left circumflex and right coronary arteries. Status post median sternotomy for CABG including LIMA to the LAD. Mediastinum/Nodes: No pathologically enlarged mediastinal or hilar lymph nodes. Esophagus is unremarkable in appearance. No axillary lymphadenopathy. Lungs/Pleura: No acute consolidative airspace disease. No pleural effusions. There are small pulmonary nodules in both lungs, largest of which measures only 5 mm in the posterior aspect of the left lower lobe (axial image 79 of series 7) and 4 mm in the posterior aspect of the right upper lobe (axial image 32 of series 7). No other larger more suspicious appearing pulmonary nodules or masses are noted. Upper Abdomen: Aortic atherosclerosis. Status post cholecystectomy. Multifocal cortical scarring in the right kidney. Musculoskeletal: Asymmetrically distributed fibroglandular tissue in the breasts (left-greater-than-right). In the central aspect of the left breast there is a 4.3 x 2.9 cm low-attenuation lesion (11 HU), which appears new compared to prior study from 05/26/2010. Median sternotomy wires. There are no aggressive appearing lytic or blastic lesions noted in the visualized portions of the skeleton. IMPRESSION: 1. 4.3 x 2.9 cm low-attenuation lesion in the central aspect of the left breast, nonspecific, but potentially a cyst. Further evaluation with nonemergent outpatient breast ultrasound is recommended to better evaluate this finding. 2. No acute findings in the thorax. 3. Small pulmonary nodules in both lungs measuring up to 5 mm in the left lower lobe,  nonspecific, but statistically likely benign. No follow-up needed if patient is low-risk (and has no known or suspected primary neoplasm). Non-contrast chest CT can be considered in 12 months if patient is high-risk. This recommendation follows the consensus statement: Guidelines for Management of Incidental Pulmonary Nodules Detected on CT Images: From the Fleischner Society 2017; Radiology 2017; 284:228-243. 4. Aortic atherosclerosis, in addition to three-vessel coronary artery disease. Status post median sternotomy for CABG including LIMA to the LAD. Aortic Atherosclerosis (ICD10-I70.0). Electronically Signed   By: Vinnie Langton M.D.   On: 08/30/2022 05:37    ED COURSE and MDM  Nursing notes, initial and subsequent vitals signs, including pulse oximetry, reviewed and interpreted by myself.  Vitals:   08/30/22 0122 08/30/22 0521  BP: (!) 166/80 (!) 154/88  Pulse: 64 (!) 111  Resp: 18 20  Temp: 98.2 F (36.8 C) 98.3 F (36.8 C)  TempSrc: Oral Temporal  SpO2: 100% 100%   Medications  haloperidol lactate (HALDOL) injection 5 mg (0 mg Intravenous Hold 08/30/22 0215)  iohexol (OMNIPAQUE) 300 MG/ML solution 100 mL (100 mLs Intravenous Contrast Given 08/30/22 0357)  sodium chloride (PF) 0.9 % injection (  Given 08/30/22 0448)   6:07 AM CT scan of the thigh does not show evidence of infection, fracture or DVT.  I suspect her pain is preliminarily related to the degenerative changes seen in the knee.  Her family states she is bedbound and does not ambulate.  Although the chest CT was not a CT angio study for pulmonary embolism there was a good dye bolus in the pulmonary arteries and I did not appreciate any significant pulmonary embolism.  The family does not report any chest complaints (pain, shortness of breath) and her oxygen saturation is 100% on room air.  I have a very low index of suspicion of pulmonary embolism in spite of the elevated D-dimer.  Also, as noted above, no DVT was seen in the  imaged left lower extremity.  The patient's husband was advised of the CT findings of the left breast and was told it was up  to the family if they wanted to pursue additional workup such as mammogram or ultrasound.    PROCEDURES  Procedures   ED DIAGNOSES     ICD-10-CM   1. Left thigh pain  M79.652     2. Large mass of left breast  N63.20          Dolores Mcgovern, Jenny Reichmann, MD 08/30/22 [id number]    Shanon Rosser, MD 08/30/22 [phone]

## 2022-09-03 ENCOUNTER — Other Ambulatory Visit: Payer: Self-pay | Admitting: Nurse Practitioner

## 2022-09-03 DIAGNOSIS — F411 Generalized anxiety disorder: Secondary | ICD-10-CM

## 2022-09-08 ENCOUNTER — Ambulatory Visit: Payer: Medicare PPO | Admitting: Psychiatry

## 2022-09-12 ENCOUNTER — Telehealth: Payer: Medicare PPO | Admitting: Nurse Practitioner

## 2022-09-13 ENCOUNTER — Telehealth: Payer: Self-pay

## 2022-09-13 ENCOUNTER — Telehealth (INDEPENDENT_AMBULATORY_CARE_PROVIDER_SITE_OTHER): Payer: Medicare PPO | Admitting: Nurse Practitioner

## 2022-09-13 ENCOUNTER — Encounter: Payer: Self-pay | Admitting: Nurse Practitioner

## 2022-09-13 DIAGNOSIS — R2681 Unsteadiness on feet: Secondary | ICD-10-CM | POA: Diagnosis not present

## 2022-09-13 DIAGNOSIS — F419 Anxiety disorder, unspecified: Secondary | ICD-10-CM

## 2022-09-13 DIAGNOSIS — R634 Abnormal weight loss: Secondary | ICD-10-CM

## 2022-09-13 DIAGNOSIS — F5101 Primary insomnia: Secondary | ICD-10-CM | POA: Diagnosis not present

## 2022-09-13 DIAGNOSIS — F32A Depression, unspecified: Secondary | ICD-10-CM | POA: Diagnosis not present

## 2022-09-13 DIAGNOSIS — F03911 Unspecified dementia, unspecified severity, with agitation: Secondary | ICD-10-CM | POA: Diagnosis not present

## 2022-09-13 DIAGNOSIS — I2510 Atherosclerotic heart disease of native coronary artery without angina pectoris: Secondary | ICD-10-CM

## 2022-09-13 DIAGNOSIS — F03918 Unspecified dementia, unspecified severity, with other behavioral disturbance: Secondary | ICD-10-CM | POA: Diagnosis not present

## 2022-09-13 NOTE — Patient Instructions (Addendum)
Half tablet of trazodone for 1 week then half tablet every other day for 1 week then stop.   Stop vit D, stop Vit b12,  Stop atorvastatin, stop ASA Stop Vit C Stop aricept   Use liquid tylenol for pain.

## 2022-09-13 NOTE — Telephone Encounter (Signed)
[name], gene are scheduled for a virtual visit with your provider today.    Just as we do with appointments in the office, we must obtain your consent to participate.  Your consent will be active for this visit and any virtual visit you may have with one of our providers in the next 365 days.    If you have a MyChart account, I can also send a copy of this consent to you electronically.  All virtual visits are billed to your insurance company just like a traditional visit in the office.  As this is a virtual visit, video technology does not allow for your provider to perform a traditional examination.  This may limit your provider's ability to fully assess your condition.  If your provider identifies any concerns that need to be evaluated in person or the need to arrange testing such as labs, EKG, etc, we will make arrangements to do so.    Although advances in technology are sophisticated, we cannot ensure that it will always work on either your end or our end.  If the connection with a video visit is poor, we may have to switch to a telephone visit.  With either a video or telephone visit, we are not always able to ensure that we have a secure connection.   I need to obtain your verbal consent now.   Are you willing to proceed with your visit today?   TAMMA MANDELL has provided verbal consent on 09/13/2022 for a virtual visit (video or telephone).   Leigh Aurora [place], [place] 09/13/2022  10:30 AM

## 2022-09-13 NOTE — Progress Notes (Signed)
   This service is provided via telemedicine  No vital signs collected/recorded due to the encounter was a telemedicine visit.   Location of patient (ex: home, work):  Home  Patient consents to a telephone visit: Yes, see telephone visit dated 09/13/22   Location of the provider (ex: office, home):  [place], [place] Location   Name of any referring provider:  N/A  Names of all persons participating in the telemedicine service and their role in the encounter:  S.Chrae B/CMA, Sherrie Mustache, NP, Spouse, Minna Merritts (son),  and Patient   Time spent on call:  9 min with medical assistant

## 2022-09-13 NOTE — Progress Notes (Signed)
Careteam: Patient Care Team: Lauree Chandler, NP as PCP - General (Geriatric Medicine) Buford Dresser, MD as PCP - Cardiology (Cardiology)  Advanced Directive information Does Patient Have a Medical Advance Directive?: Yes, Type of Advance Directive: [place], Does patient want to make changes to medical advance directive?: No - Patient declined  Allergies  Allergen Reactions   Lisinopril Other (See Comments)   Losartan Other (See Comments)   Memantine Other (See Comments)    combative   Peanut-Containing Drug Products    Tramadol Hives    Chief Complaint  Patient presents with   Referral    Discuss home health referral. Patient refuses medications at times, family is requesting suggestions on how to get patient to take medication. Visit performed via telephone and video.      HPI: Patient is a 82 y.o. female via video visit.  She went to the ED on 2/20 due to pain  Family is having a hard time getting medication in her- they are hiding her medication in food but she will find it and does not want to take it.   She is requesting hospice services.  Needs help with medication and bathing.  Having a hard time transferring.  She is staying very calm with seroquel.  She goes to bed at 10 and sleeps for about 12 hours.   She is only taking trazodone 1 tablet at night.   Eating a lot less, giving her supplements to help maintain nutrients.   Needing more help in the home. Does not like get to get up.  Review of Systems:  Review of Systems  Unable to perform ROS: Dementia    Past Medical History:  Diagnosis Date   Dementia ([place])    Gallbladder disorder 2009   Heart disease    High blood pressure    High cholesterol    Past Surgical History:  Procedure Laterality Date   ABDOMINAL HYSTERECTOMY     Per New Patient Packet    CARDIAC SURGERY  2011   Open Heart Surgey, Per New Patient Packet     REPLACEMENT TOTAL KNEE Left 2010    Per New Patient Packet    Social History:   reports that she quit smoking about 35 years ago. Her smoking use included cigarettes. She has never used smokeless tobacco. She reports that she does not currently use drugs. She reports that she does not drink alcohol.  Family History  Problem Relation Age of Onset   Dementia Mother    Arthritis Mother    Breast cancer Mother    Heart attack Father    Arthritis Father    Heart attack Sister    Arthritis Sister    Breast cancer Sister     Medications: Patient's Medications  New Prescriptions   No medications on file  Previous Medications   ACETAMINOPHEN (TYLENOL) 325 MG TABLET    Take 650 mg by mouth daily as needed for moderate pain.   ASCORBIC ACID (VITAMIN C) 1000 MG TABLET    Take 1 tablet (1,000 mg total) by mouth daily.   ASPIRIN EC 81 MG TABLET    Take 1 tablet (81 mg total) by mouth daily. Swallow whole.   ATORVASTATIN (LIPITOR) 40 MG TABLET    TAKE 1 TABLET BY MOUTH EVERY DAY   BUSPIRONE (BUSPAR) 15 MG TABLET    TAKE 1 TABLET BY MOUTH 3 TIMES DAILY.   CHOLECALCIFEROL (VITAMIN D3) 25 MCG (1000 UNIT) TABLET  Take 1 tablet (1,000 Units total) by mouth daily.   CYANOCOBALAMIN 1000 MCG TABLET    Take 1,000 mcg by mouth daily.   DICLOFENAC SODIUM (VOLTAREN) 1 % GEL    Apply 4 g topically 4 (four) times daily. To both knees. May apply 2 gm to hands QID for arthritis   DONEPEZIL (ARICEPT) 10 MG TABLET    TAKE 1 TABLET BY MOUTH EVERYDAY AT BEDTIME   INCONTINENCE SUPPLY DISPOSABLE MISC    XL incontinence brief to use as directed Dx: incontinence of urine   QUETIAPINE (SEROQUEL) 25 MG TABLET    TAKE 1 TABLET BY MOUTH TWICE A DAY   TRAZODONE (DESYREL) 50 MG TABLET    Take 1 tablet (50 mg total) by mouth at bedtime.  Modified Medications   No medications on file  Discontinued Medications   No medications on file    Physical Exam:  There were no vitals filed for this visit. There is no height or weight on file to calculate BMI. Wt  Readings from Last 3 Encounters:  07/25/22 217 lb (98.4 kg)  07/08/22 217 lb (98.4 kg)  04/07/22 221 lb (100.2 kg)    Physical Exam Constitutional:      Appearance: Normal appearance.  Pulmonary:     Effort: Pulmonary effort is normal.  Neurological:     Mental Status: She is alert. Mental status is at baseline.  Psychiatric:        Mood and Affect: Mood normal.     Labs reviewed: Basic Metabolic Panel: Recent Labs    12/15/21 1106 04/14/22 0000 08/30/22 0211  NA 139 140 139  K 4.0 3.6 3.2*  CL 105 107 105  CO2 26 26* 23  GLUCOSE 87  --  108*  BUN '7 9 10  '$ CREATININE 0.96* 1.0 0.94  CALCIUM 9.6 9.5 9.1   Liver Function Tests: Recent Labs    12/15/21 1106 04/14/22 0000  AST 16 30  ALT 11 23  ALKPHOS  --  95  BILITOT 0.5  --   PROT 7.2  --   ALBUMIN  --  4.1   No results for input(s): "LIPASE", "AMYLASE" in the last 8760 hours. No results for input(s): "AMMONIA" in the last 8760 hours. CBC: Recent Labs    12/15/21 1106 04/14/22 0000 08/30/22 0211  WBC 4.2 6.2 8.2  NEUTROABS 2,037 3.20 4.8  HGB 13.7 13.2 12.9  HCT 41.7 40 40.6  MCV 86.5  --  84.2  PLT 195 193 210   Lipid Panel: Recent Labs    12/15/21 1106  CHOL 157  HDL 61  LDLCALC 82  TRIG 64  CHOLHDL 2.6   TSH: No results for input(s): "TSH" in the last 8760 hours. A1C: No results found for: "HGBA1C"   Assessment/Plan 1. Dementia with behavioral disturbance ([place]) -advancing disease, having issues with swallowing medication, more mobility issues, decrease appetite and weight loss - Ambulatory referral to Hospice at this time for ongoing management and comfort approach.   2. 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 Anxiety and depression -controlled on buspar, will titrate off trazodone due to pill burden.   4. Primary insomnia -sleeping well on seroquel. Will have her titrate off  trazodone at this time due to decrease pill burden  5. Coronary artery disease involving native coronary artery of native heart without angina pectoris -due to advanced dementia with  pill burden will stop lipitor and ASA at this time.   6. Gait instability -has help at home but progressive gait instability due to debility from dementia.  - Ambulatory referral to Hospice  7. Weight loss Continues on supplement, expect to worsen due to progressive dementia.  - Ambulatory referral to [facility]. Harle Battiest  [facility] [phone]    Virtual Visit via Deloris Ping   I connected with patient on 09/13/22 at 10:00 AM EST by video and verified that I am speaking with the correct person using two identifiers.  Location: Patient: home Provider: [place]    I discussed the limitations, risks, security and privacy concerns of performing an evaluation and management service by telephone and the availability of in person appointments. I also discussed with the patient that there may be a patient responsible charge related to this service. The patient expressed understanding and agreed to proceed.   I discussed the assessment and treatment plan with the patient. The patient was provided an opportunity to ask questions and all were answered. The patient agreed with the plan and demonstrated an understanding of the instructions.   The patient was advised to call back or seek an in-person evaluation if the symptoms worsen or if the condition fails to improve as anticipated.  I provided 15 minutes of non-face-to-face time during this encounter.  Carlos American. Harle Battiest Avs printed and mailed

## 2022-10-07 ENCOUNTER — Emergency Department (HOSPITAL_COMMUNITY): Payer: Medicare PPO

## 2022-10-07 ENCOUNTER — Encounter (HOSPITAL_COMMUNITY): Payer: Self-pay

## 2022-10-07 ENCOUNTER — Observation Stay (HOSPITAL_COMMUNITY)
Admission: EM | Admit: 2022-10-07 | Discharge: 2022-10-09 | Disposition: A | Discharge status: Home / Self Care | Payer: Medicare PPO | Attending: Family Medicine | Admitting: Family Medicine

## 2022-10-07 ENCOUNTER — Other Ambulatory Visit: Payer: Self-pay

## 2022-10-07 DIAGNOSIS — K5730 Diverticulosis of large intestine without perforation or abscess without bleeding: Secondary | ICD-10-CM | POA: Diagnosis not present

## 2022-10-07 DIAGNOSIS — R410 Disorientation, unspecified: Secondary | ICD-10-CM | POA: Diagnosis not present

## 2022-10-07 DIAGNOSIS — E876 Hypokalemia: Secondary | ICD-10-CM | POA: Diagnosis not present

## 2022-10-07 DIAGNOSIS — I10 Essential (primary) hypertension: Secondary | ICD-10-CM | POA: Diagnosis not present

## 2022-10-07 DIAGNOSIS — R262 Difficulty in walking, not elsewhere classified: Principal | ICD-10-CM | POA: Insufficient documentation

## 2022-10-07 DIAGNOSIS — Z96652 Presence of left artificial knee joint: Secondary | ICD-10-CM | POA: Insufficient documentation

## 2022-10-07 DIAGNOSIS — I2510 Atherosclerotic heart disease of native coronary artery without angina pectoris: Secondary | ICD-10-CM | POA: Insufficient documentation

## 2022-10-07 DIAGNOSIS — R10819 Abdominal tenderness, unspecified site: Secondary | ICD-10-CM | POA: Diagnosis not present

## 2022-10-07 DIAGNOSIS — F03918 Unspecified dementia, unspecified severity, with other behavioral disturbance: Secondary | ICD-10-CM | POA: Diagnosis not present

## 2022-10-07 DIAGNOSIS — N281 Cyst of kidney, acquired: Secondary | ICD-10-CM | POA: Diagnosis not present

## 2022-10-07 DIAGNOSIS — R4182 Altered mental status, unspecified: Principal | ICD-10-CM

## 2022-10-07 DIAGNOSIS — F419 Anxiety disorder, unspecified: Secondary | ICD-10-CM | POA: Diagnosis present

## 2022-10-07 DIAGNOSIS — N6320 Unspecified lump in the left breast, unspecified quadrant: Secondary | ICD-10-CM | POA: Diagnosis not present

## 2022-10-07 DIAGNOSIS — Z87891 Personal history of nicotine dependence: Secondary | ICD-10-CM | POA: Insufficient documentation

## 2022-10-07 DIAGNOSIS — M25531 Pain in right wrist: Secondary | ICD-10-CM | POA: Insufficient documentation

## 2022-10-07 DIAGNOSIS — M47812 Spondylosis without myelopathy or radiculopathy, cervical region: Secondary | ICD-10-CM | POA: Diagnosis not present

## 2022-10-07 DIAGNOSIS — Z951 Presence of aortocoronary bypass graft: Secondary | ICD-10-CM | POA: Insufficient documentation

## 2022-10-07 DIAGNOSIS — T1490XA Injury, unspecified, initial encounter: Secondary | ICD-10-CM | POA: Diagnosis not present

## 2022-10-07 LAB — COMPREHENSIVE METABOLIC PANEL
ALT: 9 U/L (ref 0–44)
AST: 16 U/L (ref 15–41)
Albumin: 3.3 g/dL — ABNORMAL LOW (ref 3.5–5.0)
Alkaline Phosphatase: 73 U/L (ref 38–126)
Anion gap: 10 (ref 5–15)
BUN: 9 mg/dL (ref 8–23)
CO2: 22 mmol/L (ref 22–32)
Calcium: 9.1 mg/dL (ref 8.9–10.3)
Chloride: 105 mmol/L (ref 98–111)
Creatinine, Ser: 0.86 mg/dL (ref 0.44–1.00)
GFR, Estimated: >60 mL/min (ref >60)
Glucose, Bld: 104 mg/dL — ABNORMAL HIGH (ref 70–99)
Potassium: 3.2 mmol/L — ABNORMAL LOW (ref 3.5–5.1)
Sodium: 137 mmol/L (ref 135–145)
Total Bilirubin: 1.4 mg/dL — ABNORMAL HIGH (ref 0.3–1.2)
Total Protein: 7.4 g/dL (ref 6.5–8.1)

## 2022-10-07 LAB — CBC WITH DIFFERENTIAL/PLATELET
Abs Immature Granulocytes: 0.02 10*3/uL (ref 0.00–0.07)
Basophils Absolute: 0 10*3/uL (ref 0.0–0.1)
Basophils Relative: 0 %
Eosinophils Absolute: 0.1 10*3/uL (ref 0.0–0.5)
Eosinophils Relative: 1 %
HCT: 39.9 % (ref 36.0–46.0)
Hemoglobin: 12.9 g/dL (ref 12.0–15.0)
Immature Granulocytes: 0 %
Lymphocytes Relative: 17 %
Lymphs Abs: 1.3 10*3/uL (ref 0.7–4.0)
MCH: 27.5 pg (ref 26.0–34.0)
MCHC: 32.3 g/dL (ref 30.0–36.0)
MCV: 85.1 fL (ref 80.0–100.0)
Monocytes Absolute: 0.8 10*3/uL (ref 0.1–1.0)
Monocytes Relative: 10 %
Neutro Abs: 5.5 10*3/uL (ref 1.7–7.7)
Neutrophils Relative %: 72 %
Platelets: 174 10*3/uL (ref 150–400)
RBC: 4.69 MIL/uL (ref 3.87–5.11)
RDW: 14.6 % (ref 11.5–15.5)
WBC: 7.7 10*3/uL (ref 4.0–10.5)
nRBC: 0 % (ref 0.0–0.2)

## 2022-10-07 LAB — C-REACTIVE PROTEIN: CRP: 19.3 mg/dL — ABNORMAL HIGH (ref <1.0)

## 2022-10-07 LAB — URINALYSIS, ROUTINE W REFLEX MICROSCOPIC
Bacteria, UA: NONE SEEN
Bilirubin Urine: NEGATIVE
Glucose, UA: NEGATIVE mg/dL
Hgb urine dipstick: NEGATIVE
Ketones, ur: 5 mg/dL — AB
Leukocytes,Ua: NEGATIVE
Nitrite: NEGATIVE
Protein, ur: 30 mg/dL — AB
Specific Gravity, Urine: 1.023 (ref 1.005–1.030)
pH: 5 (ref 5.0–8.0)

## 2022-10-07 LAB — LACTIC ACID, PLASMA
Lactic Acid, Venous: 1.6 mmol/L (ref 0.5–1.9)
Lactic Acid, Venous: 1.9 mmol/L (ref 0.5–1.9)

## 2022-10-07 LAB — PROTIME-INR
INR: 1.1 (ref 0.8–1.2)
Prothrombin Time: 14.5 seconds (ref 11.4–15.2)

## 2022-10-07 LAB — TROPONIN I (HIGH SENSITIVITY)
Troponin I (High Sensitivity): 47 ng/L — ABNORMAL HIGH (ref <18)
Troponin I (High Sensitivity): 44 ng/L — ABNORMAL HIGH (ref <18)

## 2022-10-07 LAB — CK: Total CK: 60 U/L (ref 38–234)

## 2022-10-07 LAB — URIC ACID: Uric Acid, Serum: 5.4 mg/dL (ref 2.5–7.1)

## 2022-10-07 LAB — LIPASE, BLOOD: Lipase: 21 U/L (ref 11–51)

## 2022-10-07 LAB — SEDIMENTATION RATE: Sed Rate: 39 mm/hr — ABNORMAL HIGH (ref 0–22)

## 2022-10-07 MED ORDER — QUETIAPINE FUMARATE 25 MG PO TABS
25.0000 mg | ORAL_TABLET | Freq: Every day | ORAL | Status: DC
  Administered 2022-10-07: 25 mg via ORAL
  Filled 2022-10-07: qty 1

## 2022-10-07 MED ORDER — ONDANSETRON HCL 4 MG PO TABS: 4.0000 mg | ORAL_TABLET | Freq: Four times a day (QID) | ORAL | Status: DC | PRN

## 2022-10-07 MED ORDER — SENNOSIDES-DOCUSATE SODIUM 8.6-50 MG PO TABS: 1.0000 | ORAL_TABLET | Freq: Every evening | ORAL | Status: DC | PRN

## 2022-10-07 MED ORDER — LACTATED RINGERS IV SOLN: INTRAVENOUS | Status: DC

## 2022-10-07 MED ORDER — BUSPIRONE HCL 10 MG PO TABS
15.0000 mg | ORAL_TABLET | Freq: Once | ORAL | Status: AC
  Administered 2022-10-07: 15 mg via ORAL
  Filled 2022-10-07: qty 2

## 2022-10-07 MED ORDER — ONDANSETRON HCL 4 MG/2ML IJ SOLN: 4.0000 mg | Freq: Four times a day (QID) | INTRAMUSCULAR | Status: DC | PRN

## 2022-10-07 MED ORDER — ACETAMINOPHEN 650 MG RE SUPP: 650.0000 mg | Freq: Four times a day (QID) | RECTAL | Status: DC | PRN

## 2022-10-07 MED ORDER — IOHEXOL 300 MG/ML  SOLN
100.0000 mL | Freq: Once | INTRAMUSCULAR | Status: AC | PRN
  Administered 2022-10-07: 100 mL via INTRAVENOUS

## 2022-10-07 MED ORDER — KETOROLAC TROMETHAMINE 15 MG/ML IJ SOLN
15.0000 mg | Freq: Once | INTRAMUSCULAR | Status: AC
  Administered 2022-10-07: 15 mg via INTRAVENOUS
  Filled 2022-10-07: qty 1

## 2022-10-07 MED ORDER — POTASSIUM CHLORIDE 20 MEQ PO PACK
40.0000 meq | PACK | Freq: Once | ORAL | Status: AC
  Administered 2022-10-07: 40 meq via ORAL
  Filled 2022-10-07: qty 2

## 2022-10-07 MED ORDER — BUSPIRONE HCL 5 MG PO TABS
15.0000 mg | ORAL_TABLET | Freq: Three times a day (TID) | ORAL | Status: DC
  Administered 2022-10-08 (×3): 15 mg via ORAL
  Filled 2022-10-07 (×4): qty 3

## 2022-10-07 MED ORDER — POTASSIUM CHLORIDE CRYS ER 20 MEQ PO TBCR: 40.0000 meq | EXTENDED_RELEASE_TABLET | Freq: Once | ORAL | Status: DC

## 2022-10-07 MED ORDER — LACTATED RINGERS IV SOLN: INTRAVENOUS | Status: AC

## 2022-10-07 MED ORDER — ACETAMINOPHEN 500 MG PO TABS: 1000.0000 mg | ORAL_TABLET | Freq: Four times a day (QID) | ORAL | Status: DC | PRN

## 2022-10-07 MED ORDER — ENOXAPARIN SODIUM 40 MG/0.4ML IJ SOSY
40.0000 mg | PREFILLED_SYRINGE | INTRAMUSCULAR | Status: DC
  Administered 2022-10-08: 40 mg via SUBCUTANEOUS
  Filled 2022-10-07: qty 0.4

## 2022-10-07 MED ORDER — ACETAMINOPHEN 325 MG PO TABS
650.0000 mg | ORAL_TABLET | Freq: Once | ORAL | Status: AC
  Administered 2022-10-07 (×2): 650 mg via ORAL
  Filled 2022-10-07: qty 2

## 2022-10-07 NOTE — ED Notes (Signed)
Family reports patient does not ambulate but will stand with assist typically and get on a rolling walker but over the past 4 days has not been able to because of pain in her leg. MD notified

## 2022-10-07 NOTE — ED Provider Notes (Signed)
Care transferred to me.  Patient's workup thus far does not show any clear indication of why she is more altered according to family.  There is no evident UTI.  CT head is unremarkable.  She did have some vague abdominal pain so a CT of the abdomen was obtained but there is no acute intra-abdominal emergency.  She does have a lot of extremity pain.  Her right wrist pain seems to be an acute on chronic problem as she has had a lot of wrist arthritis issues though never been diagnosed with gout.  My suspicion this is a septic joint given that history is fairly low.  Family does note that she has had a breast mass/swelling for a while and at this point they do not want to actually pursue workup and have been considering palliative care.  Given she is in intractable pain and unable to walk, I do not think she can go home, especially with what sounds like an acute exacerbation of her chronic mental status changes.  Thus we will have to admit, discussed with Dr. Posey Pronto.   Sherwood Gambler, MD 10/07/22 2112

## 2022-10-07 NOTE — ED Provider Notes (Signed)
[place] [facility]PARTMENT AT [facility] Provider Note   CSN: [id number] Arrival date & time: 10/07/22  1353     History  Chief Complaint  Patient presents with   Altered Mental Status    Laurie Mays is a 82 y.o. female.  Level 5 caveat for dementia.  Patient brought in from home with "orange urine".  Daughter at bedside provides majority of history.  Patient complains of pain all over but no reported recent falls.  Daughter reports on her waking this morning patient was having pain to her right wrist and right leg and difficulty walking.  No history of same and no history of fall.  Daughter became concerned that the patient was having bright orange urine when she attempted to urinate and was wondering about UTI.  She has severe dementia and does not take meds regularly.  No known fever or recent illness.  No chest pain or shortness of breath, no nausea or vomiting.  Discussed with daughter [name] by phone.  She states that patient was having difficulty walking today and not able to put weight on her right leg.  She was requiring more assistance from the family to walk compared to normal.  She was also complaining of pain to her right wrist and hand.  She has had gout in the past. She denies any fall or known trauma. Did not have her Seroquel or BuSpar since last night.  The history is provided by the patient, the EMS personnel and a relative. The history is limited by the condition of the patient.  Altered Mental Status      Home Medications Prior to Admission medications   Medication Sig Start Date End Date Taking? Authorizing Provider  acetaminophen (TYLENOL) 325 MG tablet Take 650 mg by mouth daily as needed for moderate pain.    [provider]  busPIRone (BUSPAR) 15 MG tablet TAKE 1 TABLET BY MOUTH 3 TIMES DAILY. 09/05/22   Lauree Chandler, NP  diclofenac sodium (VOLTAREN) 1 % GEL Apply 4 g topically 4 (four) times daily. To both knees. May  apply 2 gm to hands QID for arthritis 11/21/18   Lauree Chandler, NP  Incontinence Supply Disposable MISC XL incontinence brief to use as directed Dx: incontinence of urine 04/10/20   Lauree Chandler, NP  QUEtiapine (SEROQUEL) 25 MG tablet TAKE 1 TABLET BY MOUTH TWICE A DAY 08/24/22   Lauree Chandler, NP      Allergies    Lisinopril, Losartan, Memantine, Peanut-containing drug products, and Tramadol    Review of Systems   Review of Systems  Unable to perform ROS: Dementia    Physical Exam Updated Vital Signs BP (!) 172/62 (BP Location: Left Arm)   Pulse 70   Temp 99.8 F (37.7 C) (Rectal)   Resp 18   Ht 5\' 4"  (1.626 m)   Wt 98.4 kg   SpO2 100%   BMI 37.25 kg/m  Physical Exam Vitals and nursing note reviewed.  Constitutional:      General: She is not in acute distress.    Appearance: She is well-developed.     Comments: Argumentative, intermittently aggressive [employer] representative present throughout entirely a physical exam and history taking  HENT:     Head: Normocephalic and atraumatic.     Mouth/Throat:     Pharynx: No oropharyngeal exudate.  Eyes:     Conjunctiva/sclera: Conjunctivae normal.     Pupils: Pupils are equal, round, and reactive to light.  Neck:     Comments: No meningismus. Cardiovascular:     Rate and Rhythm: Normal rate and regular rhythm.     Heart sounds: Normal heart sounds. No murmur heard.    Comments: Large mass to left breast which family states they are aware of but have not followed up for further evaluation. Pulmonary:     Effort: Pulmonary effort is normal. No respiratory distress.     Breath sounds: Normal breath sounds.  Abdominal:     Palpations: Abdomen is soft.     Tenderness: There is no abdominal tenderness. There is no guarding or rebound.  Musculoskeletal:        General: Tenderness present. No swelling.     Cervical back: Normal range of motion and neck supple.     Comments: Warmth, redness, swelling to right wrist with  reduced range of motion. \ Apparent shortening of left lower leg.  Pelvis is stable.  Pain with attempted flexion of bilateral hips and knees.  Skin:    General: Skin is warm.  Neurological:     Mental Status: She is alert.     Cranial Nerves: No cranial nerve deficit.     Motor: No abnormal muscle tone.     Coordination: Coordination normal.     Comments: Oriented to person only.  Moves all extremities equally and follows commands.  Psychiatric:        Behavior: Behavior normal.     ED Results / Procedures / Treatments   Labs (all labs ordered are listed, but only abnormal results are displayed) Labs Reviewed  COMPREHENSIVE METABOLIC PANEL - Abnormal; Notable for the following components:      Result Value   Potassium 3.2 (*)    Glucose, Bld 104 (*)    Albumin 3.3 (*)    Total Bilirubin 1.4 (*)    All other components within normal limits  URINALYSIS, ROUTINE W REFLEX MICROSCOPIC - Abnormal; Notable for the following components:   Color, Urine AMBER (*)    Ketones, ur 5 (*)    Protein, ur 30 (*)    All other components within normal limits  SEDIMENTATION RATE - Abnormal; Notable for the following components:   Sed Rate 39 (*)    All other components within normal limits  TROPONIN I (HIGH SENSITIVITY) - Abnormal; Notable for the following components:   Troponin I (High Sensitivity) 47 (*)    All other components within normal limits  TROPONIN I (HIGH SENSITIVITY) - Abnormal; Notable for the following components:   Troponin I (High Sensitivity) 44 (*)    All other components within normal limits  CULTURE, BLOOD (ROUTINE X 2)  CULTURE, BLOOD (ROUTINE X 2)  CBC WITH DIFFERENTIAL/PLATELET  LACTIC ACID, PLASMA  LACTIC ACID, PLASMA  CK  PROTIME-INR  LIPASE, BLOOD  URIC ACID  C-REACTIVE PROTEIN    EKG EKG Interpretation  Date/Time:  Friday October 07 2022 14:06:56 EDT Ventricular Rate:  68 PR Interval:  201 QRS Duration: 108 QT Interval:  436 QTC  Calculation: 464 R Axis:   45 Text Interpretation: Sinus rhythm Abnormal R-wave progression, late transition Left ventricular hypertrophy nonspecific ST changes, new since 2009 Confirmed by Sherwood Gambler [phone]) on 10/07/2022 3:36:36 PM  Radiology DG Wrist Complete Right  Result Date: 10/07/2022 CLINICAL DATA:  Trauma, fall EXAM: RIGHT WRIST - COMPLETE 3+ VIEW COMPARISON:  None Available. FINDINGS: No displaced fractures are seen. Degenerative changes are noted with chondrocalcinosis. There are smoothly marginated calcifications adjacent to the carpals, base of  first metacarpal and multiple metacarpophalangeal joints, more so in the second finger. Degenerative changes are also noted with bony spurs in visualized interphalangeal joints. IMPRESSION: No recent fracture or dislocation is seen in right wrist. 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 Electronically Signed   By: Elmer Picker M.D.   On: 10/07/2022 15:18   DG Chest Portable 1 View  Result Date: 10/07/2022 CLINICAL DATA:  Trauma, fall EXAM: PORTABLE CHEST 1 VIEW COMPARISON:  Previous studies including the CT chest done on 08/30/2022 FINDINGS: Transverse diameter of heart is increased. There are no signs of pulmonary edema or focal pulmonary consolidation. Left hemidiaphragm is elevated. There is no pleural effusion or pneumothorax. 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 IMPRESSION: Cardiomegaly. There are no signs of pulmonary edema or focal pulmonary consolidation. Electronically Signed   By: Elmer Picker M.D.   On: 10/07/2022 15:16   DG Pelvis Portable  Result Date: 10/07/2022 CLINICAL DATA:  Trauma, fall EXAM: PORTABLE PELVIS 1-2 VIEWS COMPARISON:  None Available. FINDINGS: No displaced fracture or dislocation is seen. Degenerative changes are noted with joint space narrowing and bony spurs in both hips. Radiopaque sutures are seen in lower abdomen. Degenerative changes are  noted in lumbar spine. Surgical clips are seen in right mid abdomen. IMPRESSION: No recent fracture or dislocation is seen. Degenerative changes are noted in both hips. Electronically Signed   By: Elmer Picker M.D.   On: 10/07/2022 15:14    Procedures Procedures    Medications Ordered in ED Medications - No data to display  ED Course/ Medical Decision Making/ A&P                             Medical Decision Making Amount and/or Complexity of Data Reviewed Independent Historian: caregiver and EMS Labs: ordered. Decision-making details documented in ED Course. Radiology: ordered and independent interpretation performed. Decision-making details documented in ED Course. ECG/medicine tests: ordered and independent interpretation performed. Decision-making details documented in ED Course.  Risk Prescription drug management.  Dementia patient from home with orange urine, pain to right wrist and right leg.  No reported fall.  99.8 temperature on arrival. Vitals stable. No distress.   AMS with more mobility issues over past several days due to pain in R wrist and R leg. No trauma.   Labs to be obtained, Xray of wrist, hip, Chest.  CT head.   Xrays of R wrist and hip are negative for fracture. Results reviewed and interpreted by me. UA negative for infection. No lactic acidosis or leukocytosis. Uric acid normal. Will check ESR and CRP.   CT head and pelvis pending at shift change.  R wrist is warm and red. Query gout which daughter states patient has had before. Septic joint considered as well.  Unclear etiology of ambulatory dysfunction and RLE pain. CT pelvis will give more information.   Breast mass discussed with daughter. They are aware of it and have decided not to pursue further evaluation given patient's age and dementia. They are aware that cancer is a possibility.   Care transferred at shift change to Dr. Regenia Skeeter.         Final Clinical Impression(s) / ED  Diagnoses Final diagnoses:  None    Rx / DC Orders ED Discharge Orders     None         Tiye Huwe, Annie Main, MD 10/07/22 1730

## 2022-10-07 NOTE — ED Triage Notes (Signed)
BIB EMS from home and lives with her husband.  Hx of dementia and is at baseline with her ocnfusion when she doesn;t take her meds which she hasn't today.  They called ems due to "orange urine".  Family reports pain in right wrist, pelvic and right leg.  Family denies fall.

## 2022-10-07 NOTE — ED Notes (Signed)
ED TO INPATIENT HANDOFF REPORT  ED Nurse Name and Phone #: Tonette Bihari [phone]  S Name/Age/Gender Laurie Mays 82 y.o. female Room/Bed: WA13/WA13  Code Status   Code Status: DNR  Home/SNF/Other Home Patient oriented to: self, place, time, and situation Is this baseline? Yes   Triage Complete: Triage complete  Chief Complaint Ambulatory dysfunction [R26.2]  Triage Note BIB EMS from home and lives with her husband.  Hx of dementia and is at baseline with her ocnfusion when she doesn;t take her meds which she hasn't today.  They called ems due to "orange urine".  Family reports pain in right wrist, pelvic and right leg.  Family denies fall.     Allergies Allergies  Allergen Reactions   Lisinopril Other (See Comments)   Losartan Other (See Comments)   Memantine Other (See Comments)    combative   Peanut-Containing Drug Products    Tramadol Hives    Level of Care/Admitting Diagnosis ED Disposition     ED Disposition  Admit   Condition  --   Comment  Hospital Area: [facility] [[id number]]  Level of Care: Med-Surg [16]  May place patient in observation at [facility] or [facility] if equivalent level of care is available:: No  Covid Evaluation: Asymptomatic - no recent exposure (last 10 days) testing not required  Diagnosis: Ambulatory dysfunction [id number]  Admitting Physician: Lenore Cordia [id number]  Attending Physician: Lenore Cordia [id number]          B Medical/Surgery History Past Medical History:  Diagnosis Date   Dementia ([place])    Gallbladder disorder 2009   Heart disease    High blood pressure    High cholesterol    Past Surgical History:  Procedure Laterality Date   ABDOMINAL HYSTERECTOMY     Per New Patient Packet    CARDIAC SURGERY  2011   Open Heart Surgey, Per New Patient Packet     REPLACEMENT TOTAL KNEE Left 2010   Per New Patient Packet      A IV Location/Drains/Wounds Patient Lines/Drains/Airways Status      Active Line/Drains/Airways     Name Placement date Placement time Site Days   Peripheral IV 10/07/22 22 G Left Antecubital 10/07/22  1432  Antecubital  less than 1            Intake/Output Last 24 hours No intake or output data in the 24 hours ending 10/07/22 2229  Labs/Imaging Results for orders placed or performed during the hospital encounter of 10/07/22 (from the past 48 hour(s))  CBC with Differential     Status: None   Collection Time: 10/07/22  2:23 PM  Result Value Ref Range   WBC 7.7 4.0 - 10.5 K/uL   RBC 4.69 3.87 - 5.11 MIL/uL   Hemoglobin 12.9 12.0 - 15.0 g/dL   HCT 39.9 36.0 - 46.0 %   MCV 85.1 80.0 - 100.0 fL   MCH 27.5 26.0 - 34.0 pg   MCHC 32.3 30.0 - 36.0 g/dL   RDW 14.6 11.5 - 15.5 %   Platelets 174 150 - 400 K/uL   nRBC 0.0 0.0 - 0.2 %   Neutrophils Relative % 72 %   Neutro Abs 5.5 1.7 - 7.7 K/uL   Lymphocytes Relative 17 %   Lymphs Abs 1.3 0.7 - 4.0 K/uL   Monocytes Relative 10 %   Monocytes Absolute 0.8 0.1 - 1.0 K/uL   Eosinophils Relative 1 %   Eosinophils Absolute 0.1 0.0 -  0.5 K/uL   Basophils Relative 0 %   Basophils Absolute 0.0 0.0 - 0.1 K/uL   Immature Granulocytes 0 %   Abs Immature Granulocytes 0.02 0.00 - 0.07 K/uL    Comment: Performed at [facility], [place] [address] [place], [place] [address]  Comprehensive metabolic panel     Status: Abnormal   Collection Time: 10/07/22  2:23 PM  Result Value Ref Range   Sodium 137 135 - 145 mmol/L   Potassium 3.2 (L) 3.5 - 5.1 mmol/L   Chloride 105 98 - 111 mmol/L   CO2 22 22 - 32 mmol/L   Glucose, Bld 104 (H) 70 - 99 mg/dL    Comment: Glucose reference range applies only to samples taken after fasting for at least 8 hours.   BUN 9 8 - 23 mg/dL   Creatinine, Ser 0.86 0.44 - 1.00 mg/dL   Calcium 9.1 8.9 - 10.3 mg/dL   Total Protein 7.4 6.5 - 8.1 g/dL   Albumin 3.3 (L) 3.5 - 5.0 g/dL   AST 16 15 - 41 U/L   ALT 9 0 - 44 U/L   Alkaline Phosphatase 73 38 - 126 U/L   Total  Bilirubin 1.4 (H) 0.3 - 1.2 mg/dL   GFR, Estimated >60 >60 mL/min    Comment: (NOTE) Calculated using the CKD-EPI Creatinine Equation (2021)    Anion gap 10 5 - 15    Comment: Performed at [facility], [place] [address], [place], [place] [address]  Troponin I (High Sensitivity)     Status: Abnormal   Collection Time: 10/07/22  2:23 PM  Result Value Ref Range   Troponin I (High Sensitivity) 47 (H) <18 ng/L    Comment: (NOTE) Elevated high sensitivity troponin I (hsTnI) values and significant  changes across serial measurements may suggest ACS but many other  chronic and acute conditions are known to elevate hsTnI results.  Refer to the "Links" section for chest pain algorithms and additional  guidance. Performed at [facility], [place] [address], [place], [place] [address]   Lactic acid, plasma     Status: None   Collection Time: 10/07/22  2:23 PM  Result Value Ref Range   Lactic Acid, Venous 1.6 0.5 - 1.9 mmol/L    Comment: Performed at [facility], [place] [address], [place], [place] [address]  CK     Status: None   Collection Time: 10/07/22  2:23 PM  Result Value Ref Range   Total CK 60 38 - 234 U/L    Comment: Performed at [facility], [place] [address] [place], [place] [address]  Protime-INR     Status: None   Collection Time: 10/07/22  2:23 PM  Result Value Ref Range   Prothrombin Time 14.5 11.4 - 15.2 seconds   INR 1.1 0.8 - 1.2    Comment: (NOTE) INR goal varies based on device and disease states. Performed at [facility], [place] [address], [place], [place] [address]   Lipase, blood     Status: None   Collection Time: 10/07/22  2:23 PM  Result Value Ref Range   Lipase 21 11 - 51 U/L    Comment: Performed at [facility], [place] [address] [place], [place] [address]  Sedimentation rate     Status: Abnormal   Collection Time: 10/07/22  2:23 PM  Result Value Ref  Range   Sed Rate 39 (H) 0 - 22 mm/hr  Comment: Performed at [facility], [place] [address] [place], [place] [address]  C-reactive protein     Status: Abnormal   Collection Time: 10/07/22  2:23 PM  Result Value Ref Range   CRP 19.3 (H) <1.0 mg/dL    Comment: Performed at [facility], [place] [address], [place], [place] [address]  Uric acid     Status: None   Collection Time: 10/07/22  2:23 PM  Result Value Ref Range   Uric Acid, Serum 5.4 2.5 - 7.1 mg/dL    Comment: Performed at [facility], [place] [address], [place], [place] [address]  Urinalysis, Routine w reflex microscopic -Urine, Clean Catch     Status: Abnormal   Collection Time: 10/07/22  2:36 PM  Result Value Ref Range   Color, Urine AMBER (A) YELLOW    Comment: BIOCHEMICALS MAY BE AFFECTED BY COLOR   APPearance CLEAR CLEAR   Specific Gravity, Urine 1.023 1.005 - 1.030   pH 5.0 5.0 - 8.0   Glucose, UA NEGATIVE NEGATIVE mg/dL   Hgb urine dipstick NEGATIVE NEGATIVE   Bilirubin Urine NEGATIVE NEGATIVE   Ketones, ur 5 (A) NEGATIVE mg/dL   Protein, ur 30 (A) NEGATIVE mg/dL   Nitrite NEGATIVE NEGATIVE   Leukocytes,Ua NEGATIVE NEGATIVE   RBC / HPF 0-5 0 - 5 RBC/hpf   WBC, UA 0-5 0 - 5 WBC/hpf   Bacteria, UA NONE SEEN NONE SEEN   Squamous Epithelial / HPF 0-5 0 - 5 /HPF   Mucus PRESENT     Comment: Performed at [facility], [place] [address] [place], [place] [address]  Blood culture (routine x 2)     Status: None (Preliminary result)   Collection Time: 10/07/22  2:46 PM   Specimen: BLOOD RIGHT ARM  Result Value Ref Range   Specimen Description      BLOOD RIGHT ARM Performed at [facility], [place] [address] [place], [place] [address]    Special Requests      BOTTLES DRAWN AEROBIC AND ANAEROBIC Blood Culture adequate volume Performed at [place] [address], [place], [place] [address]    Culture PENDING    Report Status PENDING    Lactic acid, plasma     Status: None   Collection Time: 10/07/22  4:37 PM  Result Value Ref Range   Lactic Acid, Venous 1.9 0.5 - 1.9 mmol/L    Comment: Performed at [facility], [place] [address], [place], [place] [address]  Troponin I (High Sensitivity)     Status: Abnormal   Collection Time: 10/07/22  4:37 PM  Result Value Ref Range   Troponin I (High Sensitivity) 44 (H) <18 ng/L    Comment: (NOTE) Elevated high sensitivity troponin I (hsTnI) values and significant  changes across serial measurements may suggest ACS but many other  chronic and acute conditions are known to elevate hsTnI results.  Refer to the "Links" section for chest pain algorithms and additional  guidance. Performed at [facility], [place] [address], [place], [place] [address]    CT Head Wo Contrast  Result Date: 10/07/2022 CLINICAL DATA:  Confusion EXAM: CT HEAD WITHOUT CONTRAST CT CERVICAL SPINE WITHOUT CONTRAST TECHNIQUE: Multidetector CT imaging of the head and cervical spine was performed following the standard protocol without intravenous contrast. Multiplanar CT image reconstructions of the cervical spine were also generated. RADIATION DOSE REDUCTION: This exam was performed according to the departmental dose-optimization program which  includes automated exposure control, adjustment of the mA and/or kV according to patient size and/or use of iterative reconstruction technique. COMPARISON:  MRI 12/05/2011, CT report 12/20/2016 FINDINGS: CT HEAD FINDINGS Brain: No acute territorial infarction or hemorrhage is visualized. Right anterior temporal 2.5 by 1.4 cm CSF density consistent with arachnoid cyst, no change. Mild asymmetric atrophy of the right medial temporal lobe with slight asymmetric dilatation of the temporal horn. Moderate severe atrophy. Prominent ventricles likely due to atrophy. Vascular: No hyperdense vessels.  Carotid vascular calcification Skull: Normal. Negative for  fracture or focal lesion. Sinuses/Orbits: No acute finding. Other: None CT CERVICAL SPINE FINDINGS Alignment: Mild reversal of cervical lordosis. No subluxation. Facet alignment within normal limits Skull base and vertebrae: No acute fracture. No primary bone lesion or focal pathologic process. Soft tissues and spinal canal: No prevertebral fluid or swelling. No visible canal hematoma. Disc levels: Multilevel degenerative changes. Advanced disc space narrowing and degenerative change C3-C4, C5-C6, C6-C7 and C7-T1. Hypertrophic facet degenerative changes at multiple levels with foraminal narrowing. Upper chest: Negative. Other: None IMPRESSION: No CT evidence for acute intracranial abnormality. Atrophy. Mild reversal of cervical lordosis with degenerative changes. No acute osseous abnormality. Electronically Signed   By: Donavan Foil M.D.   On: 10/07/2022 20:16   CT Cervical Spine Wo Contrast  Result Date: 10/07/2022 CLINICAL DATA:  Confusion EXAM: CT HEAD WITHOUT CONTRAST CT CERVICAL SPINE WITHOUT CONTRAST TECHNIQUE: Multidetector CT imaging of the head and cervical spine was performed following the standard protocol without intravenous contrast. Multiplanar CT image reconstructions of the cervical spine were also generated. RADIATION DOSE REDUCTION: This exam was performed according to the departmental dose-optimization program which includes automated exposure control, adjustment of the mA and/or kV according to patient size and/or use of iterative reconstruction technique. COMPARISON:  MRI 12/05/2011, CT report 12/20/2016 FINDINGS: CT HEAD FINDINGS Brain: No acute territorial infarction or hemorrhage is visualized. Right anterior temporal 2.5 by 1.4 cm CSF density consistent with arachnoid cyst, no change. Mild asymmetric atrophy of the right medial temporal lobe with slight asymmetric dilatation of the temporal horn. Moderate severe atrophy. Prominent ventricles likely due to atrophy. Vascular: No  hyperdense vessels.  Carotid vascular calcification Skull: Normal. Negative for fracture or focal lesion. Sinuses/Orbits: No acute finding. Other: None CT CERVICAL SPINE FINDINGS Alignment: Mild reversal of cervical lordosis. No subluxation. Facet alignment within normal limits Skull base and vertebrae: No acute fracture. No primary bone lesion or focal pathologic process. Soft tissues and spinal canal: No prevertebral fluid or swelling. No visible canal hematoma. Disc levels: Multilevel degenerative changes. Advanced disc space narrowing and degenerative change C3-C4, C5-C6, C6-C7 and C7-T1. Hypertrophic facet degenerative changes at multiple levels with foraminal narrowing. Upper chest: Negative. Other: None IMPRESSION: No CT evidence for acute intracranial abnormality. Atrophy. Mild reversal of cervical lordosis with degenerative changes. No acute osseous abnormality. Electronically Signed   By: Donavan Foil M.D.   On: 10/07/2022 20:16   CT ABDOMEN PELVIS W CONTRAST  Result Date: 10/07/2022 CLINICAL DATA:  Abdominal pain. EXAM: CT ABDOMEN AND PELVIS WITH CONTRAST TECHNIQUE: Multidetector CT imaging of the abdomen and pelvis was performed using the standard protocol following bolus administration of intravenous contrast. RADIATION DOSE REDUCTION: This exam was performed according to the departmental dose-optimization program which includes automated exposure control, adjustment of the mA and/or kV according to patient size and/or use of iterative reconstruction technique. CONTRAST:  123mL OMNIPAQUE IOHEXOL 300 MG/ML  SOLN COMPARISON:  CT dated 05/26/2010. FINDINGS: Evaluation of this exam  is limited due to respiratory motion artifact. Lower chest: The visualized lung bases are clear. No intra-abdominal free air or free fluid. Hepatobiliary: Small cyst in the left lobe of the liver. The liver is otherwise unremarkable. There is mild biliary dilatation, post cholecystectomy. No retained calcified stone noted in  the central CBD. Pancreas: Unremarkable. No pancreatic ductal dilatation or surrounding inflammatory changes. Spleen: Normal in size without focal abnormality. Adrenals/Urinary Tract: The adrenal glands are unremarkable. Small left renal inferior pole cyst. Focal area of cortical scarring in the upper pole of the right kidney. Punctate focus of parenchymal calcification versus nonobstructing stone in the interpolar right kidney. There is no hydronephrosis on either side. There is symmetric enhancement and excretion of contrast by both kidneys. The visualized ureters and urinary bladder appear unremarkable. Stomach/Bowel: There is sigmoid diverticulosis without active inflammatory changes. There is no bowel obstruction or active inflammation. The appendix is not visualized with certainty. No inflammatory changes identified in the right lower quadrant. Vascular/Lymphatic: Advanced aortoiliac atherosclerotic disease. The IVC is unremarkable. No portal venous gas. There is no adenopathy. Reproductive: Hysterectomy.  No adnexal masses. Other: Midline anterior abdominal wall hernia repair. Musculoskeletal: Degenerative changes of the spine. No acute osseous pathology. IMPRESSION: 1. No acute intra-abdominal or pelvic pathology. 2. Sigmoid diverticulosis. No bowel obstruction. 3.  Aortic Atherosclerosis (ICD10-I70.0). Electronically Signed   By: Anner Crete M.D.   On: 10/07/2022 20:12   DG Wrist Complete Right  Result Date: 10/07/2022 CLINICAL DATA:  Trauma, fall EXAM: RIGHT WRIST - COMPLETE 3+ VIEW COMPARISON:  None Available. FINDINGS: No displaced fractures are seen. Degenerative changes are noted with chondrocalcinosis. There are smoothly marginated calcifications adjacent to the carpals, base of first metacarpal and multiple metacarpophalangeal joints, more so in the second finger. Degenerative changes are also noted with bony spurs in visualized interphalangeal joints. IMPRESSION: No recent fracture or  dislocation is seen in right wrist. 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 Electronically Signed   By: Elmer Picker M.D.   On: 10/07/2022 15:18   DG Chest Portable 1 View  Result Date: 10/07/2022 CLINICAL DATA:  Trauma, fall EXAM: PORTABLE CHEST 1 VIEW COMPARISON:  Previous studies including the CT chest done on 08/30/2022 FINDINGS: Transverse diameter of heart is increased. There are no signs of pulmonary edema or focal pulmonary consolidation. Left hemidiaphragm is elevated. There is no pleural effusion or pneumothorax. 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 IMPRESSION: Cardiomegaly. There are no signs of pulmonary edema or focal pulmonary consolidation. Electronically Signed   By: Elmer Picker M.D.   On: 10/07/2022 15:16   DG Pelvis Portable  Result Date: 10/07/2022 CLINICAL DATA:  Trauma, fall EXAM: PORTABLE PELVIS 1-2 VIEWS COMPARISON:  None Available. FINDINGS: No displaced fracture or dislocation is seen. Degenerative changes are noted with joint space narrowing and bony spurs in both hips. Radiopaque sutures are seen in lower abdomen. Degenerative changes are noted in lumbar spine. Surgical clips are seen in right mid abdomen. IMPRESSION: No recent fracture or dislocation is seen. Degenerative changes are noted in both hips. Electronically Signed   By: Elmer Picker M.D.   On: 10/07/2022 15:14    Pending Labs Unresulted Labs (From admission, onward)     Start     Ordered   10/08/22 0500  CBC  Tomorrow morning,   R        10/07/22 2215   10/08/22 [id number]  Basic metabolic panel  Tomorrow morning,   R  10/07/22 2215   10/07/22 1430  Blood culture (routine x 2)  BLOOD CULTURE X 2,   R (with STAT occurrences)      10/07/22 1429            Vitals/Pain Today's Vitals   10/07/22 1409 10/07/22 1709 10/07/22 1800 10/07/22 1817  BP:  (!) 179/72 116/63   Pulse:  83 86   Resp:  18 20   Temp: 99.8 F (37.7 C)    99.8 F (37.7 C)  TempSrc: Rectal   Axillary  SpO2:  100% 96%   Weight:      Height:        Isolation Precautions No active isolations  Medications Medications  QUEtiapine (SEROQUEL) tablet 25 mg (25 mg Oral Given 10/07/22 2141)  lactated ringers infusion ( Intravenous New Bag/Given 10/07/22 1739)  enoxaparin (LOVENOX) injection 40 mg (has no administration in time range)  acetaminophen (TYLENOL) tablet 1,000 mg (has no administration in time range)    Or  acetaminophen (TYLENOL) suppository 650 mg (has no administration in time range)  ondansetron (ZOFRAN) tablet 4 mg (has no administration in time range)    Or  ondansetron (ZOFRAN) injection 4 mg (has no administration in time range)  senna-docusate (Senokot-S) tablet 1 tablet (has no administration in time range)  busPIRone (BUSPAR) tablet 15 mg (has no administration in time range)  busPIRone (BUSPAR) tablet 15 mg (15 mg Oral Given 10/07/22 1600)  potassium chloride (KLOR-CON) packet 40 mEq (40 mEq Oral Given 10/07/22 1714)  ketorolac (TORADOL) 15 MG/ML injection 15 mg (15 mg Intravenous Given 10/07/22 1739)  iohexol (OMNIPAQUE) 300 MG/ML solution 100 mL (100 mLs Intravenous Contrast Given 10/07/22 1959)  acetaminophen (TYLENOL) tablet 650 mg (650 mg Oral Given 10/07/22 2143)    Mobility power wheelchair     Focused Assessments    R Recommendations: See Admitting Provider Note  Report given to:   Additional Notes:

## 2022-10-07 NOTE — Hospital Course (Signed)
Laurie Mays is a 82 y.o. female with medical history significant for dementia with behavior disturbance, CAD s/p CABG, depression/anxiety, gait instability who is admitted with acute on chronic ambulatory dysfunction.

## 2022-10-07 NOTE — ED Notes (Signed)
Family reports increased confusion x 2 days and then this morning noticed she was having difficulty walking and standing and dragging her leg.  Also reports she wouldn't use her right arm.  Patient has mass noted in left breast approx size of grapefruit.  PERRL symmetrical smile.  No slurred speech.  Used left hand to pick up right arm.  Redness and swelling noted to right wrist. Family denies patient falling.

## 2022-10-07 NOTE — H&P (Signed)
History and Physical    Laurie Mays [id number] DOB: [birth year] DOA: 10/07/2022  PCP: Lauree Chandler, NP  Patient coming from: Home  I have personally briefly reviewed patient's old medical records in [place]  Chief Complaint: Functional decline, wrist pain  HPI: Laurie Mays is a 82 y.o. female with medical history significant for dementia with behavior disturbance, CAD s/p CABG, depression/anxiety, gait instability who presented to the ED from home for evaluation of worsening ambulatory dysfunction compared to baseline as well as right wrist and right leg pain.  History is limited from patient due to advanced dementia and is otherwise supplemented by EDP, chart review, and two of her daughters at bedside.  Patient coming from home where she lives with family.  She has advanced dementia and requires assistance with most ADLs as well as ambulation at her baseline.  Today she has had a decline in her ambulatory function.  She was unable to stand even with family assistance.  She was complaining of pain in her right leg and right wrist.  She has not had any falls or obvious injury per family.  Family also noted her urine appeared to be orange-colored.  Family does note that patient has a left breast mass that they are aware of.  They have made the decision not to seek further workup regarding the breast mass due to patient's underlying dementia and current quality of life.  Patient has been referred to hospice and family states that the hospice team will be seeing them at home this coming Monday 4/1.  On exam, patient is somnolent but will intermittently awaken.  She moves all extremities spontaneously.  No localized weakness or obvious injury.  No obvious joint swelling.  She is not localizing any pain.  ED Course  Labs/Imaging on admission: I have personally reviewed following labs and imaging studies.  Initial vitals showed BP 172/62, pulse 81, RR 18, temp 9 9.8 F  rectally, SpO2 100% on room air.  Labs show WBC 7.7, hemoglobin 12.9, platelets 174,000, sodium 137, potassium 3.2, bicarb 22, BUN 9, creatinine 0.86, serum glucose 104, troponin 47 > 44, ESR 39, CRP 19.3, lactic acid 1.9.  Blood cultures in process.  Urinalysis negative for UTI.  Uric acid 5.4.  CK 60.  Pelvic x-ray negative for fracture or dislocation.  Portable chest x-ray negative for focal consolidation or edema.  Right wrist x-ray negative for fracture or dislocation.  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  CT head without contrast negative for acute intracranial abnormality.  Atrophy noted.  CT cervical spine without contrast shows mild reversal of cervical lordosis with degenerative changes.  No acute osseous abnormality.  CT abdomen/pelvis with contrast negative for acute intra-abdominal or pelvic pathology.  Sigmoid diverticulosis noted.  No bowel obstruction.  Patient was given IV Toradol 15 mg, oral potassium 40 mEq.  The hospitalist service was consulted to admit for further evaluation and management.  Review of Systems: All systems reviewed and are negative except as documented in history of present illness above.   Past Medical History:  Diagnosis Date   Dementia ([place])    Gallbladder disorder 2009   Heart disease    High blood pressure    High cholesterol     Past Surgical History:  Procedure Laterality Date   ABDOMINAL HYSTERECTOMY     Per New Patient Packet    CARDIAC SURGERY  2011   Open Heart Surgey, Per New Patient  Packet     REPLACEMENT TOTAL KNEE Left 2010   Per New Patient Packet     Social History:  reports that she quit smoking about 35 years ago. Her smoking use included cigarettes. She has never used smokeless tobacco. She reports that she does not currently use drugs. She reports that she does not drink alcohol.  Allergies  Allergen Reactions   Lisinopril Other (See Comments)   Losartan  Other (See Comments)   Memantine Other (See Comments)    combative   Peanut-Containing Drug Products    Tramadol Hives    Family History  Problem Relation Age of Onset   Dementia Mother    Arthritis Mother    Breast cancer Mother    Heart attack Father    Arthritis Father    Heart attack Sister    Arthritis Sister    Breast cancer Sister      Prior to Admission medications   Medication Sig Start Date End Date Taking? Authorizing Provider  acetaminophen (TYLENOL) 325 MG tablet Take 650 mg by mouth daily as needed for moderate pain.    [provider]  busPIRone (BUSPAR) 15 MG tablet TAKE 1 TABLET BY MOUTH 3 TIMES DAILY. 09/05/22   Lauree Chandler, NP  diclofenac sodium (VOLTAREN) 1 % GEL Apply 4 g topically 4 (four) times daily. To both knees. May apply 2 gm to hands QID for arthritis 11/21/18   Lauree Chandler, NP  Incontinence Supply Disposable MISC XL incontinence brief to use as directed Dx: incontinence of urine 04/10/20   Lauree Chandler, NP  QUEtiapine (SEROQUEL) 25 MG tablet TAKE 1 TABLET BY MOUTH TWICE A DAY 08/24/22   Lauree Chandler, NP    Physical Exam: Vitals:   10/07/22 1409 10/07/22 1709 10/07/22 1800 10/07/22 1817  BP:  (!) 179/72 116/63   Pulse:  83 86   Resp:  18 20   Temp: 99.8 F (37.7 C)   99.8 F (37.7 C)  TempSrc: Rectal   Axillary  SpO2:  100% 96%   Weight:      Height:       Constitutional: Elderly woman resting supine in bed, NAD, calm, comfortable Eyes: EOMI, lids and conjunctivae normal ENMT: Mucous membranes are moist. Posterior pharynx clear of any exudate or lesions.Normal dentition.  Neck: normal, supple, no masses. Respiratory: clear to auscultation anteriorly. Normal respiratory effort. No accessory muscle use.  Cardiovascular: Regular rate and rhythm, no murmurs / rubs / gallops. No extremity edema. 2+ pedal pulses. Abdomen: no tenderness, no masses palpated.  Musculoskeletal: no clubbing / cyanosis. No joint  deformity upper and lower extremities. Good ROM, no contractures. Normal muscle tone.  Skin: Left breast mass.  No rashes, lesions, ulcers. No induration Neurologic: Sensation intact. Strength equal bilaterally. Psychiatric: Alert and oriented to self.  EKG: Personally reviewed. Sinus rhythm, rate 68, no acute ischemic changes, LVH.  Assessment/Plan Principal Problem:   Ambulatory dysfunction Active Problems:   Coronary artery disease involving native coronary artery of native heart without angina pectoris   Dementia with behavioral disturbance [facility])   Anxiety disorder   Laurie Mays is a 82 y.o. female with medical history significant for dementia with behavior disturbance, CAD s/p CABG, depression/anxiety, gait instability who is admitted with acute on chronic ambulatory dysfunction.  Assessment and Plan: Acute on chronic ambulatory dysfunction and functional decline on background of advanced dementia: Patient with poor ambulatory function at baseline however had notable change per family in which she  has not been able to ambulate even with the help of walker and family assistance which is new.  She has not fallen or had any obvious injury.  Extensive workup so far in the ED is largely reassuring.  Family also notes decline in appetite and has not been eating or drinking much.  Without clear inciting etiology, discussed with family this may be progressive functional decline related to her dementia.  They do state that they have been referred for hospice care who will visit them at home on 4/1. -PT/OT eval, fall precautions -Gentle IV fluid hydration overnight -Keep hospice care appointment on 4/1  Advanced dementia with behavioral disturbance/depression/anxiety: Recent functional decline as above, concern is for progression of underlying dementia.  Family are closely involved and have an appointment with hospice care this coming Monday. -Continue Seroquel and BuSpar -Delirium  precautions -CODE STATUS is DNR  Right wrist pain: X-ray without evidence of fracture or dislocation.  Degenerative and chondrocalcinosis changes noted. ESR and CRP elevated but ROM intact, no obvious joint effusion or sign of septic arthritis.    Hypokalemia: Mild, oral supplement given.  Check magnesium and replete if needed.  Left breast mass: Known left breast mass per family.  Family have elected to avoid further investigation due to patient's advanced dementia and current quality of life.  CAD: Prior history of CABG.  No changes on EKG.  She is off of antiplatelets, statin due to advanced dementia.  Not on beta-blocker due to history of bradycardia.   DVT prophylaxis: enoxaparin (LOVENOX) injection 40 mg Start: 10/08/22 2200 Code Status: DNR, discussed with daughters on admission Family Communication: Daughter is at bedside Disposition Plan: From home and likely discharge to home pending clinical progress Consults called: None Severity of Illness: The appropriate patient status for this patient is OBSERVATION. Observation status is judged to be reasonable and necessary in order to provide the required intensity of service to ensure the patient's safety. The patient's presenting symptoms, physical exam findings, and initial radiographic and laboratory data in the context of their medical condition is felt to place them at decreased risk for further clinical deterioration. Furthermore, it is anticipated that the patient will be medically stable for discharge from the hospital within 2 midnights of admission.   Zada Finders MD [facility]  If 7PM-7AM, please contact night-coverage [email or web address]  10/07/2022, 11:31 PM

## 2022-10-08 DIAGNOSIS — R262 Difficulty in walking, not elsewhere classified: Secondary | ICD-10-CM | POA: Diagnosis not present

## 2022-10-08 DIAGNOSIS — F03918 Unspecified dementia, unspecified severity, with other behavioral disturbance: Secondary | ICD-10-CM

## 2022-10-08 LAB — CBC
HCT: 39.3 % (ref 36.0–46.0)
Hemoglobin: 12.8 g/dL (ref 12.0–15.0)
MCH: 27.8 pg (ref 26.0–34.0)
MCHC: 32.6 g/dL (ref 30.0–36.0)
MCV: 85.4 fL (ref 80.0–100.0)
Platelets: 145 10*3/uL — ABNORMAL LOW (ref 150–400)
RBC: 4.6 MIL/uL (ref 3.87–5.11)
RDW: 14.7 % (ref 11.5–15.5)
WBC: 7.6 10*3/uL (ref 4.0–10.5)
nRBC: 0 % (ref 0.0–0.2)

## 2022-10-08 LAB — BASIC METABOLIC PANEL
Anion gap: 12 (ref 5–15)
BUN: 9 mg/dL (ref 8–23)
CO2: 18 mmol/L — ABNORMAL LOW (ref 22–32)
Calcium: 8.8 mg/dL — ABNORMAL LOW (ref 8.9–10.3)
Chloride: 106 mmol/L (ref 98–111)
Creatinine, Ser: 0.82 mg/dL (ref 0.44–1.00)
GFR, Estimated: >60 mL/min (ref >60)
Glucose, Bld: 94 mg/dL (ref 70–99)
Potassium: 3.5 mmol/L (ref 3.5–5.1)
Sodium: 136 mmol/L (ref 135–145)

## 2022-10-08 LAB — MAGNESIUM: Magnesium: 2 mg/dL (ref 1.7–2.4)

## 2022-10-08 MED ORDER — FLUCONAZOLE 150 MG PO TABS
150.0000 mg | ORAL_TABLET | Freq: Once | ORAL | Status: AC
  Administered 2022-10-08: 150 mg via ORAL
  Filled 2022-10-08: qty 1

## 2022-10-08 MED ORDER — ACETAMINOPHEN 160 MG/5ML PO SOLN
650.0000 mg | Freq: Four times a day (QID) | ORAL | Status: DC | PRN
  Administered 2022-10-08: 650 mg via ORAL
  Filled 2022-10-08: qty 20.3

## 2022-10-08 MED ORDER — QUETIAPINE FUMARATE 25 MG PO TABS
25.0000 mg | ORAL_TABLET | Freq: Two times a day (BID) | ORAL | Status: DC
  Administered 2022-10-08: 25 mg via ORAL
  Filled 2022-10-08 (×2): qty 1

## 2022-10-08 MED ORDER — LACTATED RINGERS IV SOLN: INTRAVENOUS | Status: AC

## 2022-10-08 NOTE — Plan of Care (Signed)

## 2022-10-08 NOTE — Discharge Summary (Signed)
Physician Discharge Summary   Patient: Laurie Mays MRN: [id number] DOB: 1941/02/25  Admit date:     10/07/2022  Discharge date: 10/08/22  Discharge Physician: Murray Hodgkins   PCP: Lauree Chandler, NP   Recommendations at discharge:  Progressive dementia  Discharge Diagnoses: Principal Problem:   Ambulatory dysfunction Active Problems:   Coronary artery disease involving native coronary artery of native heart without angina pectoris   Dementia with behavioral disturbance ([place])   Anxiety disorder  Resolved Problems:   * No resolved hospital problems. *  Hospital Course: Laurie Mays is a 82 y.o. female with medical history significant for dementia with behavior disturbance, CAD s/p CABG, depression/anxiety, gait instability who is admitted with acute on chronic ambulatory dysfunction.  Admitted for further evaluation of ambulatory dysfunction.  82 year old woman PMH including dementia with behavioral disturbance, depression, anxiety, gait instability, presented with functional decline and decreased walking.  Admitted for further evaluation of acute on chronic ambulatory dysfunction and functional decline.  Workup was unrevealing and after further discussion with the family and history obtained combined with clinical examination and review of data, appears to be secondary to dementia and functional decline.  At baseline patient does not walk outside of the house.  She has declined over the last several weeks.  Is becoming difficult to get him to drink enough fluids.  She does have some behavioral issues.  They are able to get her urinate in the morning but is difficult to get her to urinate again during the day.  She has had no falls.  Acute on chronic ambulatory dysfunction and functional decline on background of advanced dementia: Laboratory investigation unrevealing.  Troponins were mildly elevated, unclear why these were checked, trivial elevation clinically insignificant.   EKG nonacute. Pelvis x-ray, right wrist, CXR, CTs head/neck/abd/pelvis no acute disease  Exam difficult but appears to be benign and nonfocal. Given plan for hospice and difficulty following commands, I do not think therapy evaluations will be of benefit. No concerning findings.  Family agrees with discharge home.  Keep hospice appointment 4/1.   Advanced dementia with behavioral disturbance/depression/anxiety: Suspect progression of dementia.  Family is having to become more creative to administer medications successfully.   Continue Seroquel and buspirone   Right wrist pain: X-ray without evidence of fracture or dislocation.  Degenerative and chondrocalcinosis changes noted. ESR and CRP elevated but ROM intact, no obvious joint effusion or sign of septic arthritis.     Hypokalemia: Repleted.   Left breast mass: Known left breast mass per family.  Family have elected to avoid further investigation due to patient's advanced dementia and current quality of life.   CAD: Prior history of CABG.  No changes on EKG.  She is off of antiplatelets, statin due to advanced dementia.  Not on beta-blocker due to history of bradycardia.  Pseudo urinary retention.  Per family the patient voids in the morning but then may have great difficulty persuading her to void later in the day.  Consultants:  None  Procedures performed:  None   Disposition: Home Diet recommendation:  Resume home diet  DISCHARGE MEDICATION: Allergies as of 10/08/2022       Reactions   Lisinopril Other (See Comments)   Losartan Other (See Comments)   Memantine Other (See Comments)   combative   Peanut-containing Drug Products    Tramadol Hives        Medication List     TAKE these medications    acetaminophen 325 MG tablet Commonly  known as: TYLENOL Take 650 mg by mouth daily as needed for moderate pain.   busPIRone 15 MG tablet Commonly known as: BUSPAR TAKE 1 TABLET BY MOUTH 3 TIMES DAILY.    diclofenac sodium 1 % Gel Commonly known as: VOLTAREN Apply 4 g topically 4 (four) times daily. To both knees. May apply 2 gm to hands QID for arthritis What changed:  when to take this reasons to take this additional instructions   Incontinence Supply Disposable Misc XL incontinence brief to use as directed Dx: incontinence of urine   QUEtiapine 25 MG tablet Commonly known as: SEROQUEL TAKE 1 TABLET BY MOUTH TWICE A DAY        Follow-up Information     Lauree Chandler, NP Follow up.   Specialty: Geriatric Medicine Why: As needed Contact information: [place] [place] [address] [phone]                Discharge Exam: Filed Weights   10/07/22 1406  Weight: 98.4 kg   Subjective:  Confused.   Physical Exam:       Vitals:    10/07/22 1817 10/07/22 2320 10/08/22 1009 10/08/22 1346  BP:   (!) 175/65 (!) 146/67 (!) 169/85  Pulse:     69 68  Resp:   18 16 18   Temp: 99.8 F (37.7 C) 98.8 F (37.1 C) 98.3 F (36.8 C)    TempSrc: Axillary Axillary Oral    SpO2:   100% 99% 99%  Weight:          Height:            Physical Exam Vitals reviewed.  Constitutional:      General: She is not in acute distress.    Appearance: She is not ill-appearing or toxic-appearing.  Cardiovascular:     Rate and Rhythm: Normal rate and regular rhythm.     Heart sounds: No murmur heard. Pulmonary:     Effort: Pulmonary effort is normal. No respiratory distress.     Breath sounds: No wheezing, rhonchi or rales.  Musculoskeletal:     Right lower leg: No edema.     Left lower leg: No edema.     Comments: Moves all extremities but does not consistently follow commands and degree of effort is unclear  Neurological:     Mental Status: She is alert.   Condition at discharge: good  The results of significant diagnostics from this hospitalization (including imaging, microbiology, ancillary and laboratory) are listed below for reference.   Imaging  Studies: CT Head Wo Contrast  Result Date: 10/07/2022 CLINICAL DATA:  Confusion EXAM: CT HEAD WITHOUT CONTRAST CT CERVICAL SPINE WITHOUT CONTRAST TECHNIQUE: Multidetector CT imaging of the head and cervical spine was performed following the standard protocol without intravenous contrast. Multiplanar CT image reconstructions of the cervical spine were also generated. RADIATION DOSE REDUCTION: This exam was performed according to the departmental dose-optimization program which includes automated exposure control, adjustment of the mA and/or kV according to patient size and/or use of iterative reconstruction technique. COMPARISON:  MRI 12/05/2011, CT report 12/20/2016 FINDINGS: CT HEAD FINDINGS Brain: No acute territorial infarction or hemorrhage is visualized. Right anterior temporal 2.5 by 1.4 cm CSF density consistent with arachnoid cyst, no change. Mild asymmetric atrophy of the right medial temporal lobe with slight asymmetric dilatation of the temporal horn. Moderate severe atrophy. Prominent ventricles likely due to atrophy. Vascular: No hyperdense vessels.  Carotid vascular calcification Skull: Normal. Negative for fracture or focal  lesion. Sinuses/Orbits: No acute finding. Other: None CT CERVICAL SPINE FINDINGS Alignment: Mild reversal of cervical lordosis. No subluxation. Facet alignment within normal limits Skull base and vertebrae: No acute fracture. No primary bone lesion or focal pathologic process. Soft tissues and spinal canal: No prevertebral fluid or swelling. No visible canal hematoma. Disc levels: Multilevel degenerative changes. Advanced disc space narrowing and degenerative change C3-C4, C5-C6, C6-C7 and C7-T1. Hypertrophic facet degenerative changes at multiple levels with foraminal narrowing. Upper chest: Negative. Other: None IMPRESSION: No CT evidence for acute intracranial abnormality. Atrophy. Mild reversal of cervical lordosis with degenerative changes. No acute osseous abnormality.  Electronically Signed   By: Donavan Foil M.D.   On: 10/07/2022 20:16   CT Cervical Spine Wo Contrast  Result Date: 10/07/2022 CLINICAL DATA:  Confusion EXAM: CT HEAD WITHOUT CONTRAST CT CERVICAL SPINE WITHOUT CONTRAST TECHNIQUE: Multidetector CT imaging of the head and cervical spine was performed following the standard protocol without intravenous contrast. Multiplanar CT image reconstructions of the cervical spine were also generated. RADIATION DOSE REDUCTION: This exam was performed according to the departmental dose-optimization program which includes automated exposure control, adjustment of the mA and/or kV according to patient size and/or use of iterative reconstruction technique. COMPARISON:  MRI 12/05/2011, CT report 12/20/2016 FINDINGS: CT HEAD FINDINGS Brain: No acute territorial infarction or hemorrhage is visualized. Right anterior temporal 2.5 by 1.4 cm CSF density consistent with arachnoid cyst, no change. Mild asymmetric atrophy of the right medial temporal lobe with slight asymmetric dilatation of the temporal horn. Moderate severe atrophy. Prominent ventricles likely due to atrophy. Vascular: No hyperdense vessels.  Carotid vascular calcification Skull: Normal. Negative for fracture or focal lesion. Sinuses/Orbits: No acute finding. Other: None CT CERVICAL SPINE FINDINGS Alignment: Mild reversal of cervical lordosis. No subluxation. Facet alignment within normal limits Skull base and vertebrae: No acute fracture. No primary bone lesion or focal pathologic process. Soft tissues and spinal canal: No prevertebral fluid or swelling. No visible canal hematoma. Disc levels: Multilevel degenerative changes. Advanced disc space narrowing and degenerative change C3-C4, C5-C6, C6-C7 and C7-T1. Hypertrophic facet degenerative changes at multiple levels with foraminal narrowing. Upper chest: Negative. Other: None IMPRESSION: No CT evidence for acute intracranial abnormality. Atrophy. Mild reversal of  cervical lordosis with degenerative changes. No acute osseous abnormality. Electronically Signed   By: Donavan Foil M.D.   On: 10/07/2022 20:16   CT ABDOMEN PELVIS W CONTRAST  Result Date: 10/07/2022 CLINICAL DATA:  Abdominal pain. EXAM: CT ABDOMEN AND PELVIS WITH CONTRAST TECHNIQUE: Multidetector CT imaging of the abdomen and pelvis was performed using the standard protocol following bolus administration of intravenous contrast. RADIATION DOSE REDUCTION: This exam was performed according to the departmental dose-optimization program which includes automated exposure control, adjustment of the mA and/or kV according to patient size and/or use of iterative reconstruction technique. CONTRAST:  129mL OMNIPAQUE IOHEXOL 300 MG/ML  SOLN COMPARISON:  CT dated 05/26/2010. FINDINGS: Evaluation of this exam is limited due to respiratory motion artifact. Lower chest: The visualized lung bases are clear. No intra-abdominal free air or free fluid. Hepatobiliary: Small cyst in the left lobe of the liver. The liver is otherwise unremarkable. There is mild biliary dilatation, post cholecystectomy. No retained calcified stone noted in the central CBD. Pancreas: Unremarkable. No pancreatic ductal dilatation or surrounding inflammatory changes. Spleen: Normal in size without focal abnormality. Adrenals/Urinary Tract: The adrenal glands are unremarkable. Small left renal inferior pole cyst. Focal area of cortical scarring in the upper pole of the right kidney.  Punctate focus of parenchymal calcification versus nonobstructing stone in the interpolar right kidney. There is no hydronephrosis on either side. There is symmetric enhancement and excretion of contrast by both kidneys. The visualized ureters and urinary bladder appear unremarkable. Stomach/Bowel: There is sigmoid diverticulosis without active inflammatory changes. There is no bowel obstruction or active inflammation. The appendix is not visualized with certainty. No  inflammatory changes identified in the right lower quadrant. Vascular/Lymphatic: Advanced aortoiliac atherosclerotic disease. The IVC is unremarkable. No portal venous gas. There is no adenopathy. Reproductive: Hysterectomy.  No adnexal masses. Other: Midline anterior abdominal wall hernia repair. Musculoskeletal: Degenerative changes of the spine. No acute osseous pathology. IMPRESSION: 1. No acute intra-abdominal or pelvic pathology. 2. Sigmoid diverticulosis. No bowel obstruction. 3.  Aortic Atherosclerosis (ICD10-I70.0). Electronically Signed   By: Anner Crete M.D.   On: 10/07/2022 20:12   DG Wrist Complete Right  Result Date: 10/07/2022 CLINICAL DATA:  Trauma, fall EXAM: RIGHT WRIST - COMPLETE 3+ VIEW COMPARISON:  None Available. FINDINGS: No displaced fractures are seen. Degenerative changes are noted with chondrocalcinosis. There are smoothly marginated calcifications adjacent to the carpals, base of first metacarpal and multiple metacarpophalangeal joints, more so in the second finger. Degenerative changes are also noted with bony spurs in visualized interphalangeal joints. IMPRESSION: No recent fracture or dislocation is seen in right wrist. 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 Electronically Signed   By: Elmer Picker M.D.   On: 10/07/2022 15:18   DG Chest Portable 1 View  Result Date: 10/07/2022 CLINICAL DATA:  Trauma, fall EXAM: PORTABLE CHEST 1 VIEW COMPARISON:  Previous studies including the CT chest done on 08/30/2022 FINDINGS: Transverse diameter of heart is increased. There are no signs of pulmonary edema or focal pulmonary consolidation. Left hemidiaphragm is elevated. There is no pleural effusion or pneumothorax. 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 IMPRESSION: Cardiomegaly. There are no signs of pulmonary edema or focal pulmonary consolidation. Electronically Signed   By: Elmer Picker M.D.   On: 10/07/2022 15:16    DG Pelvis Portable  Result Date: 10/07/2022 CLINICAL DATA:  Trauma, fall EXAM: PORTABLE PELVIS 1-2 VIEWS COMPARISON:  None Available. FINDINGS: No displaced fracture or dislocation is seen. Degenerative changes are noted with joint space narrowing and bony spurs in both hips. Radiopaque sutures are seen in lower abdomen. Degenerative changes are noted in lumbar spine. Surgical clips are seen in right mid abdomen. IMPRESSION: No recent fracture or dislocation is seen. Degenerative changes are noted in both hips. Electronically Signed   By: Elmer Picker M.D.   On: 10/07/2022 15:14    Microbiology: Results for orders placed or performed during the hospital encounter of 10/07/22  Blood culture (routine x 2)     Status: None (Preliminary result)   Collection Time: 10/07/22  2:21 PM   Specimen: BLOOD  Result Value Ref Range Status   Specimen Description   Final    BLOOD BLOOD LEFT ARM Performed at [facility] [address], [place], [place] [address]    Special Requests   Final    BOTTLES DRAWN AEROBIC AND ANAEROBIC Blood Culture results may not be optimal due to an excessive volume of blood received in culture bottles Performed at [facility] [address], [place], [place] [address]    Culture  Setup Time PENDING  Incomplete   Culture   Final    NO GROWTH < 24 HOURS Performed at [facility], [address] [place], [place] [address]    Report Status PENDING  Incomplete  Blood culture (routine x 2)     Status: None (Preliminary result)   Collection Time: 10/07/22  2:46 PM   Specimen: BLOOD RIGHT ARM  Result Value Ref Range Status   Specimen Description   Final    BLOOD RIGHT ARM Performed at [facility], [place] [address] [place], [place] [address]    Special Requests   Final    BOTTLES DRAWN AEROBIC AND ANAEROBIC Blood Culture adequate volume Performed at [facility] [address]  [place], [place] [address]    Culture   Final    NO GROWTH < 24 HOURS Performed at [facility] [address], [place], [place] [address]    Report Status PENDING  Incomplete    Labs: CBC: Recent Labs  Lab 10/07/22 1423 10/08/22 0451  WBC 7.7 7.6  NEUTROABS 5.5  --   HGB 12.9 12.8  HCT 39.9 39.3  MCV 85.1 85.4  PLT 174 [id number]*   Basic Metabolic Panel: Recent Labs  Lab 10/07/22 1423 10/08/22 0451  NA 137 136  K 3.2* 3.5  CL 105 106  CO2 22 18*  GLUCOSE 104* 94  BUN 9 9  CREATININE 0.86 0.82  CALCIUM 9.1 8.8*  MG  --  2.0   Liver Function Tests: Recent Labs  Lab 10/07/22 1423  AST 16  ALT 9  ALKPHOS 73  BILITOT 1.4*  PROT 7.4  ALBUMIN 3.3*   CBG: No results for input(s): "GLUCAP" in the last 168 hours.  Discharge time spent: greater than 30 minutes.  Signed: Murray Hodgkins, MD [facility] 10/08/2022

## 2022-10-08 NOTE — Progress Notes (Signed)
OT Cancellation Note  Patient Details Name: Laurie Mays MRN: [id number] DOB: [birth year]   Cancelled Treatment:    Reason Eval/Treat Not Completed: Patient declined, no reason specified Patients husband was present during session. Patient reported " I am going to kick the sh** out of you"  and "and I mean it" when blankets were touched. OT to continue to follow and check back for appropriateness on 3/31.  Rennie Plowman, MS Acute Rehabilitation Department Office# [phone]  10/08/2022, 2:14 PM

## 2022-10-08 NOTE — Progress Notes (Signed)
PT Cancellation Note  Patient Details Name: Laurie Mays MRN: [id number] DOB: [birth year]   Cancelled Treatment:     PT order received but eval deferred.  Patient declined, no reason specified Patients husband was present during session. Patient reported " I am going to kick the sh** out of you"  and "and I mean it" when blankets were touched. PT to continue to follow and check back for appropriateness on 3/31.    [name] 10/08/2022, 2:40 PM

## 2022-10-08 NOTE — Care Management Obs Status (Signed)
[place] NOTIFICATION   Patient Details  Name: Laurie Mays MRN: [id number] Date of Birth: 1940/08/11   Medicare Observation Status Notification Given:  Yes    Bartholomew Crews, RN 10/08/2022, 7:34 PM

## 2022-10-08 NOTE — Progress Notes (Signed)
D/C orders noted, LMOVM for 2 different #s for TOC, need to address condition code 44 and pt will need transportation back home via PTAR. Have been unable to get above done, AC, CN and MD notified that d/c will not happen tonight. Family at bedside and update given.

## 2022-10-08 NOTE — Care Management CC44 (Signed)
Condition Code 44 Documentation Completed  Patient Details  Name: Laurie Mays MRN: [id number] Date of Birth: [birth year]   Condition Code 44 given:  Yes Patient signature on Condition Code 44 notice:  Yes Documentation of 2 MD's agreement:  Yes Code 44 added to claim:  Yes    Bartholomew Crews, RN 10/08/2022, 7:34 PM

## 2022-10-09 DIAGNOSIS — Z7401 Bed confinement status: Secondary | ICD-10-CM | POA: Diagnosis not present

## 2022-10-09 DIAGNOSIS — R262 Difficulty in walking, not elsewhere classified: Secondary | ICD-10-CM | POA: Diagnosis not present

## 2022-10-09 DIAGNOSIS — F03918 Unspecified dementia, unspecified severity, with other behavioral disturbance: Secondary | ICD-10-CM | POA: Diagnosis not present

## 2022-10-09 DIAGNOSIS — R531 Weakness: Secondary | ICD-10-CM | POA: Diagnosis not present

## 2022-10-09 LAB — CULTURE, BLOOD (ROUTINE X 2)

## 2022-10-09 MED ORDER — HALOPERIDOL 1 MG PO TABS
1.0000 mg | ORAL_TABLET | Freq: Once | ORAL | Status: AC
  Filled 2022-10-09: qty 1

## 2022-10-09 MED ORDER — HALOPERIDOL LACTATE 5 MG/ML IJ SOLN
1.0000 mg | Freq: Once | INTRAMUSCULAR | Status: AC
  Administered 2022-10-09: 1 mg via INTRAMUSCULAR
  Filled 2022-10-09: qty 1

## 2022-10-09 MED ORDER — DIPHENHYDRAMINE HCL 50 MG/ML IJ SOLN: 25.0000 mg | Freq: Once | INTRAMUSCULAR | Status: DC

## 2022-10-09 NOTE — Progress Notes (Signed)
Progress Note   Patient: Laurie Mays [id number] DOB: 11-30-1940 DOA: 10/07/2022     1 DOS: the patient was seen and examined on 10/09/2022   Brief hospital course: 82 year old woman PMH including dementia with behavioral disturbance, depression, anxiety, gait instability, presented with functional decline and decreased walking. Admitted for further evaluation of acute on chronic ambulatory dysfunction and functional decline. Workup was unrevealing and after further discussion with the family and history obtained combined with clinical examination and review of data, appears to be secondary to dementia and functional decline. At baseline patient does not walk outside of the house. She has declined over the last several weeks. Is becoming difficult to get him to drink enough fluids. She does have some behavioral issues. They are able to get her urinate in the morning but is difficult to get her to urinate again during the day. She has had no falls.   Assessment and Plan: Acute on chronic ambulatory dysfunction and functional decline on background of advanced dementia: Laboratory investigation unrevealing.  Troponins were mildly elevated, unclear why these were checked, trivial elevation clinically insignificant.  EKG nonacute. Pelvis x-ray, right wrist, CXR, CTs head/neck/abd/pelvis no acute disease  Exam difficult but appears to be benign and nonfocal. Given plan for hospice and difficulty following commands, I do not think therapy evaluations will be of benefit. No concerning findings.  Family agrees with discharge home.  Keep hospice appointment 4/1. Stable for discharge, could not get PTAR last night.   Advanced dementia with behavioral disturbance/depression/anxiety: Suspect progression of dementia.  Family is having to become more creative to administer medications successfully.   Continue Seroquel and buspirone Stable.    Right wrist pain: X-ray without evidence of fracture or  dislocation.  Degenerative and chondrocalcinosis changes noted. ESR and CRP elevated but ROM intact, no obvious joint effusion or sign of septic arthritis.     Hypokalemia: Repleted.   Left breast mass: Known left breast mass per family.  Family have elected to avoid further investigation due to patient's advanced dementia and current quality of life.   CAD: Prior history of CABG.  No changes on EKG.  She is off of antiplatelets, statin due to advanced dementia.  Not on beta-blocker due to history of bradycardia.   Pseudo urinary retention.  Per family the patient voids in the morning but then may have great difficulty persuading her to void later in the day.      Subjective:  Confused  Discussed with RN  Physical Exam: Vitals:   10/08/22 1009 10/08/22 1346 10/08/22 2130 10/09/22 0533  BP: (!) 146/67 (!) 169/85 (!) 144/87 (!) 174/75  Pulse: 69 68 73 79  Resp: 16 18 16 16   Temp: 98.3 F (36.8 C)  (!) 97.5 F (36.4 C) 98.8 F (37.1 C)  TempSrc: Oral  Oral Oral  SpO2: 99% 99% 100% 100%  Weight:      Height:       Physical Exam Vitals reviewed.  Constitutional:      General: She is not in acute distress.    Appearance: She is not ill-appearing or toxic-appearing.  Cardiovascular:     Rate and Rhythm: Normal rate and regular rhythm.     Heart sounds: No murmur heard. Pulmonary:     Effort: Pulmonary effort is normal. No respiratory distress.     Breath sounds: No wheezing, rhonchi or rales.  Neurological:     Mental Status: She is alert.  Psychiatric:  Mood and Affect: Mood normal.     Comments: confused     Data Reviewed: No new data  Family Communication:   Disposition: Status is: Observation   Planned Discharge Destination: Home    Time spent: 20 minutes  Author: Murray Hodgkins, MD 10/09/2022 10:36 AM  For on call review [email or web address].

## 2022-10-09 NOTE — Plan of Care (Signed)

## 2022-10-09 NOTE — Progress Notes (Signed)
OT Cancellation Note  Patient Details Name: Laurie Mays MRN: [id number] DOB: 05/18/1941   Cancelled Treatment:    Reason Eval/Treat Not Completed: Other (comment) Per nurse,Patient with advanced dementia, no new learning and plans to transition to hospice at time of d/c. OT signing off at this time. Thank you for this referral Rennie Plowman, [facility] Acute Rehabilitation Department Office# [phone]  10/09/2022, 8:02 AM

## 2022-10-09 NOTE — Progress Notes (Signed)
PT Cancellation Note  Patient Details Name: Laurie Mays MRN: [id number] DOB: 1941/05/26   Cancelled Treatment:     Reason Eval/Treat Not Completed: Other (comment) Per nurse,Patient with advanced dementia, no new learning and plans to transition to hospice at time of d/c. PT signing off at this time.    Abuk Selleck 10/09/2022, 11:13 AM

## 2022-10-09 NOTE — TOC Initial Note (Signed)
Transition of Care [facility]) - Initial/Assessment Note    Patient Details  Name: Laurie Mays MRN: [id number] Date of Birth: [birth year]  Transition of Care [facility]) CM/SW Contact:    Rodney Booze, LCSW Phone Number: 10/09/2022, 10:10 AM  Clinical Narrative:                 Patient just needed transportation back to house. At this time TOC arranged for DC. This CSW has called transport.   Expected Discharge Plan: Home/Self Care Barriers to Discharge: No Barriers Identified   Patient Goals and CMS Choice            Expected Discharge Plan and Services       Living arrangements for the past 2 months: Single Family Home Expected Discharge Date: 10/09/22                                    Prior Living Arrangements/Services Living arrangements for the past 2 months: Single Family Home Lives with:: Spouse Patient language and need for interpreter reviewed:: No        Need for Family Participation in Patient Care: No (Comment) Care giver support system in place?: Yes (comment)   Criminal Activity/Legal Involvement Pertinent to Current Situation/Hospitalization: No - Comment as needed  Activities of Daily Living Home Assistive Devices/Equipment: Eyeglasses ADL Screening (condition at time of admission) Patient's cognitive ability adequate to safely complete daily activities?: No Is the patient deaf or have difficulty hearing?: No Does the patient have difficulty seeing, even when wearing glasses/contacts?: No Does the patient have difficulty concentrating, remembering, or making decisions?: Yes Patient able to express need for assistance with ADLs?: Yes Does the patient have difficulty dressing or bathing?: Yes Independently performs ADLs?: No Communication: Independent Dressing (OT): Needs assistance Is this a change from baseline?: Pre-admission baseline Grooming: Needs assistance Is this a change from baseline?: Pre-admission baseline Feeding:  Dependent Is this a change from baseline?: Pre-admission baseline Bathing: Dependent Is this a change from baseline?: Pre-admission baseline Toileting: Dependent Is this a change from baseline?: Pre-admission baseline In/Out Bed: Dependent Is this a change from baseline?: Pre-admission baseline Walks in Home: Needs assistance Is this a change from baseline?: Pre-admission baseline Does the patient have difficulty walking or climbing stairs?: Yes Weakness of Legs: Both Weakness of Arms/Hands: Right  Permission Sought/Granted                  Emotional Assessment Appearance:: Appears stated age     Orientation: : Oriented to Self, Oriented to  Time, Oriented to Place, Oriented to Situation Alcohol / Substance Use: Not Applicable Psych Involvement: No (comment)  Admission diagnosis:  Ambulatory dysfunction [R26.2] Altered mental status, unspecified altered mental status type [R41.82] Patient Active Problem List   Diagnosis Date Noted   Ambulatory dysfunction 10/07/2022   Allergic rhinitis 01/19/2018   Essential hypertension 01/19/2018   Coronary artery disease involving native coronary artery of native heart without angina pectoris 01/19/2018   Mixed hyperlipidemia 01/19/2018   Bilateral leg edema 01/19/2018   Dementia with behavioral disturbance ([place]) 01/19/2018   Morbid obesity with BMI of 40.0-44.9, adult ([place]) 12/28/2016   Anxiety disorder 11/02/2015   CKD (chronic kidney disease), stage III ([place]) 11/02/2015   Hearing loss 11/02/2015   Hepatic cyst 11/02/2015   Insomnia 11/02/2015   Major depressive disorder, recurrent ([place]) 11/02/2015   Osteoporosis 11/02/2015   Primary osteoarthritis involving multiple joints  11/02/2015   Tinnitus 11/02/2015   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/20/2014   Hypertensive retinopathy of left eye, grade 1 05/20/2014   Senile nuclear sclerosis 05/20/2014   Mixed hearing loss 04/02/2012   PCP:  Lauree Chandler, NP Pharmacy:   [id number] - [place], [address] [place] [address] Phone: [phone] Fax: [phone]     Social Determinants of Health (SDOH) Social History: SDOH Screenings   Food Insecurity: Patient Unable To Answer (10/08/2022)  Transportation Needs: No Transportation Needs (01/19/2018)  Depression (PHQ2-9): Low Risk  (02/08/2022)  Financial Resource Strain: Low Risk  (01/19/2018)  Physical Activity: Inactive (01/19/2018)  Social Connections: Moderately Integrated (01/19/2018)  Stress: No Stress Concern Present (01/19/2018)  Tobacco Use: Medium Risk (10/07/2022)   SDOH Interventions:     Readmission Risk Interventions    10/09/2022   10:09 AM  Readmission Risk Prevention Plan  Post Dischage Appt Complete  Medication Screening Complete  Transportation Screening Complete

## 2022-10-09 NOTE — Progress Notes (Signed)
AVS printed and placed in d/c packet for family, pt d/c to home via [place] w all belongings in stable condition. Family aware.

## 2022-10-09 NOTE — TOC Transition Note (Signed)
Transition of Care [facility]) - CM/SW Discharge Note   Patient Details  Name: Laurie Mays MRN: [id number] Date of Birth: [birth year]  [facility]) CM/SW Contact:  Rodney Booze, LCSW Phone Number: 10/09/2022, 10:10 AM   Clinical Narrative:     Patient will DC home with husband. PTAR called.   Final next level of care: Home/Self Care Barriers to Discharge: No Barriers Identified   Patient Goals and CMS Choice      Discharge Placement                         Discharge Plan and Services Additional resources added to the After Visit Summary for                                       Social Determinants of Health (SDOH) Interventions SDOH Screenings   Food Insecurity: Patient Unable To Answer (10/08/2022)  Transportation Needs: No Transportation Needs (01/19/2018)  Depression (PHQ2-9): Low Risk  (02/08/2022)  Financial Resource Strain: Low Risk  (01/19/2018)  Physical Activity: Inactive (01/19/2018)  Social Connections: Moderately Integrated (01/19/2018)  Stress: No Stress Concern Present (01/19/2018)  Tobacco Use: Medium Risk (10/07/2022)     Readmission Risk Interventions    10/09/2022   10:09 AM  Readmission Risk Prevention Plan  Post Dischage Appt Complete  Medication Screening Complete  Transportation Screening Complete

## 2022-10-12 LAB — CULTURE, BLOOD (ROUTINE X 2)
Culture: NO GROWTH
Culture: NO GROWTH
Special Requests: ADEQUATE

## 2022-11-09 DEATH — deceased

## 2022-11-11 ENCOUNTER — Ambulatory Visit: Payer: Medicare PPO | Admitting: Nurse Practitioner

## 2022-11-25 ENCOUNTER — Other Ambulatory Visit: Payer: Self-pay | Admitting: Nurse Practitioner

## 2022-11-25 DIAGNOSIS — G4700 Insomnia, unspecified: Secondary | ICD-10-CM

## 2022-11-25 DIAGNOSIS — F32A Depression, unspecified: Secondary | ICD-10-CM

## 2023-02-13 ENCOUNTER — Encounter: Payer: Medicare PPO | Admitting: Nurse Practitioner
